# Patient Record
Sex: Female | Born: 2000 | Race: Black or African American | Hispanic: No | Marital: Single | Smoking: Former smoker
Health system: Southern US, Community
[De-identification: ages and names within clinical notes are randomized; demographics above are authoritative.]

## PROBLEM LIST (undated history)

## (undated) ENCOUNTER — Inpatient Hospital Stay (HOSPITAL_COMMUNITY): Payer: Self-pay

## (undated) ENCOUNTER — Ambulatory Visit: Admission: EM | Payer: Medicaid Other | Source: Home / Self Care

## (undated) VITALS — BP 108/79 | HR 105 | Temp 97.6°F | Resp 14 | Ht 59.45 in | Wt 86.0 lb

## (undated) DIAGNOSIS — IMO0001 Reserved for inherently not codable concepts without codable children: Secondary | ICD-10-CM

## (undated) DIAGNOSIS — K219 Gastro-esophageal reflux disease without esophagitis: Secondary | ICD-10-CM

## (undated) DIAGNOSIS — F32A Depression, unspecified: Secondary | ICD-10-CM

## (undated) DIAGNOSIS — N898 Other specified noninflammatory disorders of vagina: Secondary | ICD-10-CM

## (undated) DIAGNOSIS — N949 Unspecified condition associated with female genital organs and menstrual cycle: Secondary | ICD-10-CM

## (undated) DIAGNOSIS — F329 Major depressive disorder, single episode, unspecified: Secondary | ICD-10-CM

## (undated) DIAGNOSIS — R519 Headache, unspecified: Secondary | ICD-10-CM

## (undated) DIAGNOSIS — R51 Headache: Secondary | ICD-10-CM

## (undated) DIAGNOSIS — J302 Other seasonal allergic rhinitis: Secondary | ICD-10-CM

## (undated) DIAGNOSIS — J45909 Unspecified asthma, uncomplicated: Secondary | ICD-10-CM

## (undated) DIAGNOSIS — F419 Anxiety disorder, unspecified: Secondary | ICD-10-CM

## (undated) DIAGNOSIS — N765 Ulceration of vagina: Secondary | ICD-10-CM

## (undated) DIAGNOSIS — F988 Other specified behavioral and emotional disorders with onset usually occurring in childhood and adolescence: Secondary | ICD-10-CM

## (undated) DIAGNOSIS — T7840XA Allergy, unspecified, initial encounter: Secondary | ICD-10-CM

## (undated) DIAGNOSIS — N912 Amenorrhea, unspecified: Secondary | ICD-10-CM

## (undated) DIAGNOSIS — K120 Recurrent oral aphthae: Secondary | ICD-10-CM

## (undated) DIAGNOSIS — M25569 Pain in unspecified knee: Secondary | ICD-10-CM

## (undated) DIAGNOSIS — Z309 Encounter for contraceptive management, unspecified: Secondary | ICD-10-CM

## (undated) DIAGNOSIS — Z3009 Encounter for other general counseling and advice on contraception: Secondary | ICD-10-CM

## (undated) HISTORY — DX: Encounter for other general counseling and advice on contraception: Z30.09

## (undated) HISTORY — DX: Pain in unspecified knee: M25.569

## (undated) HISTORY — DX: Ulceration of vagina: N76.5

## (undated) HISTORY — PX: APPENDECTOMY: SHX54

## (undated) HISTORY — DX: Major depressive disorder, single episode, unspecified: F32.9

## (undated) HISTORY — DX: Depression, unspecified: F32.A

## (undated) HISTORY — DX: Unspecified condition associated with female genital organs and menstrual cycle: N94.9

## (undated) HISTORY — DX: Other specified behavioral and emotional disorders with onset usually occurring in childhood and adolescence: F98.8

## (undated) HISTORY — DX: Other specified noninflammatory disorders of vagina: N89.8

## (undated) HISTORY — DX: Anxiety disorder, unspecified: F41.9

## (undated) HISTORY — DX: Encounter for contraceptive management, unspecified: Z30.9

## (undated) HISTORY — DX: Amenorrhea, unspecified: N91.2

---
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
## 2021-01-01 ENCOUNTER — Ambulatory Visit
Admission: RE | Admit: 2021-01-01 | Discharge: 2021-01-01 | Disposition: A | Discharge status: Home / Self Care | Payer: Medicaid Other | Source: Ambulatory Visit | Attending: Family Medicine | Admitting: Family Medicine

## 2021-01-01 ENCOUNTER — Other Ambulatory Visit: Payer: Self-pay

## 2021-01-01 VITALS — BP 97/64 | HR 93 | Temp 98.2°F | Resp 18

## 2021-01-01 DIAGNOSIS — K121 Other forms of stomatitis: Secondary | ICD-10-CM

## 2021-01-01 MED ORDER — LIDOCAINE VISCOUS HCL 2 % MT SOLN: 15.0000 mL | OROMUCOSAL | 0 refills | Status: DC | PRN

## 2021-01-01 MED ORDER — PREDNISONE 20 MG PO TABS: 40.0000 mg | ORAL_TABLET | Freq: Every day | ORAL | 0 refills | Status: DC

## 2021-01-01 MED ORDER — NYSTATIN 100000 UNIT/ML MT SUSP: 500000.0000 [IU] | Freq: Four times a day (QID) | OROMUCOSAL | 0 refills | Status: DC

## 2021-01-01 NOTE — ED Provider Notes (Signed)
[facility]    CSN: [id number] Arrival date & time: 01/01/21  1348      History   Chief Complaint Chief Complaint  Patient presents with  . Mouth Lesions    HPI Taylor Jefferson is a 20 y.o. female.   HPI Patient presents today with painful oral ulcers. Patient reports since early adolescents experiencing ulceration in mouth. Current oral ulcer present for a few days. Ulcerations localized to lower lip, and tip of tongue. She is currently taking antibiotics for UTI and is uncertain if this caused her current flare of mouth ulcers.she has been rinsing with mouthwash without relief. Previously prescribed a mouth rinse, uncertain of the medication. No recent illness or recent fever. Past Medical History:  Diagnosis Date  . ADD (attention deficit disorder)   . Allergy   . Amenorrhea 11/02/2014  . Anxiety   . Asthma   . Chlamydia 09/27/2019   tx 12/7, POC________  . Contraceptive education 04/04/2014  . Contraceptive management 04/22/14  . Death of parent 14-Aug-2020  . Depression   . HA (headache)   . History of chlamydia 10/25/2019  . Irregular periods 03/17/2019  . Patellar pain    Dr. Dion Saucier  . 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 Precocious puberty 10/08/2011  . Reflux   . Reflux   . Seasonal allergies   . Suicidal thoughts 01/06/2018  . Vaginal burning 11/02/2014  . Vaginal discharge 11/02/2014  . Vaginal ulceration 04/04/2014   Will culture for HSV GC/CHL and a wound culture was obtained    Patient Active Problem List   Diagnosis Date Noted  . Non-seasonal allergic rhinitis due to pollen 08/29/2020  . Mild persistent asthma without complication 02/29/2020  . Flat feet, bilateral 10/09/2018  . 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 Migraine without aura and without status migrainosus, not intractable 12/24/2016  . Anxiety state 01/04/2015  . Tension headache 01/04/2015  .  Contraceptive management 2014-04-22  . Vaginal ulceration 04/04/2014    History reviewed. No pertinent surgical history.  OB History    Gravida  0   Para      Term      Preterm      AB      Living        SAB      IAB      Ectopic      Multiple      Live Births               Home Medications    Prior to Admission medications   Medication Sig Start Date End Date Taking? Authorizing Provider  lidocaine (XYLOCAINE) 2 % solution Use as directed 15 mLs in the mouth or throat every 4 (four) hours as needed for mouth pain (mix with nystatin gargle and spit). 01/01/21  Yes Bing Neighbors, FNP  nystatin (MYCOSTATIN) 100000 UNIT/ML suspension Take 5 mLs (500,000 Units total) by mouth 4 (four) times daily. 01/01/21  Yes Bing Neighbors, FNP  predniSONE (DELTASONE) 20 MG tablet Take 2 tablets (40 mg total) by mouth daily with breakfast. 01/01/21  Yes Bing Neighbors, FNP  albuterol (VENTOLIN HFA) 108 (90 Base) MCG/ACT inhaler Inhale 2 puffs into the lungs every 4 (four) hours as needed (Cough). USE WITH SPACER 08/29/20   Antonietta Barcelona, MD  cetirizine (ZYRTEC) 5 MG chewable tablet Chew 1 tablet (5 mg total) by mouth daily. 08/29/20   Antonietta Barcelona, MD  colchicine 0.6 MG tablet Take 0.6 mg by mouth daily. 04/05/19   [provider]  dapsone 25 MG tablet Take 10 mg by mouth daily.  06/18/17   [provider]  Dexmethylphenidate HCl 25 MG CP24 Take 1 capsule by mouth daily. 07/09/19   [provider]  etonogestrel (NEXPLANON) 68 MG IMPL implant 1 each by Subdermal route once.    [provider]  famotidine (PEPCID) 20 MG tablet Take 1 tablet (20 mg total) by mouth 2 (two) times daily. 07/12/20   Harris, Cammy Copa, PA-C  fluticasone (FLOVENT HFA) 44 MCG/ACT inhaler Inhale 2 puffs into the lungs in the morning and at bedtime. USE WITH A SPACER 08/29/20 09/28/20  Antonietta Barcelona, MD  GuanFACINE HCl 3 MG TB24 Take 1 tablet by mouth daily. 06/21/20   [provider]  HUMIRA PEN 40 MG/0.4ML PNKT Inject 40 mg into the skin every 14 (fourteen) days.  04/21/19   [provider]  montelukast (SINGULAIR) 10 MG tablet Take 1 tablet (10 mg total) by mouth daily. 10/16/20   Antonietta Barcelona, MD  OXcarbazepine (TRILEPTAL) 150 MG tablet Take 3 tablets (450 mg total) by mouth 2 (two) times daily. Patient taking differently: Take 300 mg by mouth 2 (two) times daily.  01/09/18   Starkes-Perry, Juel Burrow, FNP  propranolol (INDERAL) 10 MG tablet Take 1 tablet (10 mg total) by mouth 2 (two) times daily. 01/26/20   Keturah Shavers, MD  Spacer/Aero-Hold Chamber Mask (MASK VORTEX/CHILD/FROG) MISC 1 Units by Does not apply route as directed. 08/29/20   Antonietta Barcelona, MD  traZODone (DESYREL) 100 MG tablet Take 100 mg by mouth at bedtime as needed.  02/11/18   [provider]    Family History Family History  Problem Relation Age of Onset  . Depression Mother   . HIV Mother   . Other Mother        spinal stenosis  . Glaucoma Mother   . Migraines Mother   . Heart disease Father   . Hypertension Maternal Grandmother   . Arthritis Maternal Grandmother        rheumatoid  . Other Maternal Grandmother        acid reflux  . Migraines Maternal Grandmother   . Depression Maternal Grandmother   . Hyperlipidemia Maternal Grandfather   . ADD / ADHD Sister   . ADD / ADHD Brother     Social History Social History   Tobacco Use  . Smoking status: Former Games developer  . Smokeless tobacco: Never Used  Vaping Use  . Vaping Use: Every day  . Substances: Nicotine  Substance Use Topics  . Alcohol use: No  . Drug use: Yes    Types: Marijuana     Allergies   Other   Review of Systems Review of Systems Pertinent negatives listed in HPI   Physical Exam Triage Vital Signs ED Triage Vitals  Enc Vitals Group     BP 01/01/21 1424 97/64     Pulse Rate 01/01/21 1424 93     Resp 01/01/21 1424 18     Temp 01/01/21 1424 98.2 F (36.8 C)     Temp Source  01/01/21 1424 Oral     SpO2 01/01/21 1424 97 %     Weight --      Height --      Head Circumference --      Peak Flow --      Pain Score 01/01/21 1423 9     Pain Loc --  Pain Edu? --      Excl. in GC? --    No data found.  Updated Vital Signs BP 97/64 (BP Location: Right Arm)   Pulse 93   Temp 98.2 F (36.8 C) (Oral)   Resp 18   SpO2 97%   Visual Acuity Right Eye Distance:   Left Eye Distance:   Bilateral Distance:    Right Eye Near:   Left Eye Near:    Bilateral Near:     Physical Exam Constitutional:      Appearance: Normal appearance.  HENT:     Head: Normocephalic.     Mouth/Throat:     Mouth: Mucous membranes are moist. Oral lesions present.     Tongue: Lesions present.     Pharynx: Oropharynx is clear. Uvula midline.     Tonsils: No tonsillar exudate.     Comments: Ulceration lower lip and tip on tongue  Cardiovascular:     Rate and Rhythm: Normal rate and regular rhythm.  Musculoskeletal:     Cervical back: Normal range of motion and neck supple.  Skin:    General: Skin is warm.  Neurological:     Mental Status: She is alert.    UC Treatments / Results  Labs (all labs ordered are listed, but only abnormal results are displayed) Labs Reviewed - No data to display  EKG   Radiology No results found.  Procedures Procedures (including critical care time)  Medications Ordered in UC Medications - No data to display  Initial Impression / Assessment and Plan / UC Course  I have reviewed the triage vital signs and the nursing notes.  Pertinent labs & imaging results that were available during my care of the patient were reviewed by me and considered in my medical decision making (see chart for details).    Mouth ulceration, trial Nystatin and lidocaine mix rinse and spit. Prednisone 40 mg daily x 5 days. Return precautions discussed. Final Clinical Impressions(s) / UC Diagnoses   Final diagnoses:  Mouth ulceration   Discharge  Instructions   None    ED Prescriptions    Medication Sig Dispense Auth. Provider   nystatin (MYCOSTATIN) 100000 UNIT/ML suspension Take 5 mLs (500,000 Units total) by mouth 4 (four) times daily. 60 mL Bing Neighbors, FNP   lidocaine (XYLOCAINE) 2 % solution Use as directed 15 mLs in the mouth or throat every 4 (four) hours as needed for mouth pain (mix with nystatin gargle and spit). 100 mL Bing Neighbors, FNP   predniSONE (DELTASONE) 20 MG tablet Take 2 tablets (40 mg total) by mouth daily with breakfast. 10 tablet Bing Neighbors, FNP     PDMP not reviewed this encounter.   Bing Neighbors, FNP 01/04/21 [id number]

## 2021-01-01 NOTE — ED Triage Notes (Signed)
Pt states she is having a flare up of mouth ulcers, has a hx of the same .  Also reports lower abd cramping x 2 months, dx with uti and is currently on abx.

## 2021-01-02 ENCOUNTER — Ambulatory Visit (INDEPENDENT_AMBULATORY_CARE_PROVIDER_SITE_OTHER): Payer: Medicaid Other | Admitting: Adult Health

## 2021-01-02 ENCOUNTER — Encounter: Payer: Self-pay | Admitting: Adult Health

## 2021-01-02 ENCOUNTER — Other Ambulatory Visit (HOSPITAL_COMMUNITY)
Admission: RE | Admit: 2021-01-02 | Discharge: 2021-01-02 | Disposition: A | Discharge status: Home / Self Care | Payer: Medicaid Other | Source: Ambulatory Visit | Attending: Adult Health | Admitting: Adult Health

## 2021-01-02 VITALS — BP 110/77 | HR 87 | Ht 62.0 in | Wt 120.0 lb

## 2021-01-02 DIAGNOSIS — Z3202 Encounter for pregnancy test, result negative: Secondary | ICD-10-CM

## 2021-01-02 DIAGNOSIS — N921 Excessive and frequent menstruation with irregular cycle: Secondary | ICD-10-CM

## 2021-01-02 DIAGNOSIS — Z975 Presence of (intrauterine) contraceptive device: Secondary | ICD-10-CM

## 2021-01-02 LAB — POCT URINE PREGNANCY: Preg Test, Ur: NEGATIVE

## 2021-01-02 MED ORDER — MEGESTROL ACETATE 40 MG PO TABS: ORAL_TABLET | ORAL | 1 refills | Status: DC

## 2021-01-02 NOTE — Progress Notes (Signed)
  Subjective:     Patient ID: Taylor Jefferson, female   DOB: Jun 17, 2001, 20 y.o.   MRN: [id number]  HPI Taylor Jefferson is a 20 year old black female,single, G0P0 in complaining of irregular bleeding with nexplanon, and pain with sex. She was seen in ER in [facility] 12/24/20 and told had cyst on right ovary. PCP is Dr Taylor Jefferson.  Review of Systems Has had headache for 2 days, ?allergies try zyrtec  +irregular  Bleeding, has nexplanon +Pain with sex   Reviewed past medical,surgical, social and family history. Reviewed medications and allergies.  Objective:   Physical Exam BP 110/77 (BP Location: Left Arm, Patient Position: Sitting, Cuff Size: Normal)   Pulse 87   Ht 5\' 2"  (1.575 m)   Wt 120 lb (54.4 kg)   BMI 21.95 kg/m UPT is negative.Skin warm and dry.  Lungs: clear to ausculation bilaterally. Cardiovascular: regular rate and rhythm.   Pelvic: external genitalia is normal in appearance no lesions, vagina: tan discharge without odor,urethra has no lesions or masses noted, cervix:smooth,no CMT, uterus: normal size, shape and contour, mildly tender, no masses felt, adnexa: no masses or tenderness noted. Bladder is non tender and no masses felt. CV swab obtained. Fall risk is low  Upstream - 01/02/21 1228      Pregnancy Intention Screening   Does the patient want to become pregnant in the next year? No    Does the patient's partner want to become pregnant in the next year? No    Would the patient like to discuss contraceptive options today? No      Contraception Wrap Up   Current Method Hormonal Implant    End Method Hormonal Implant    Contraception Counseling Provided No         Co exam with 1229 NP student. Assessment:     1. Pregnancy examination or test, negative result   2. Irregular intermenstrual bleeding Will try megace Meds ordered this encounter  Medications  . megestrol (MEGACE) 40 MG tablet    Sig: Take 3 x 5 days then 2 x 5 days then 1 daily till bleeding stops     Dispense:  45 tablet    Refill:  1    Order Specific Question:   Supervising Provider    Answer:   [place] Charon H [[id number]]   CV swab sent for GC/CHl,trich and BV and yeast   3. Nexplanon in place     Plan:     Return in 4 weeks in follow and she is interested in IUD, gave handout on mirena

## 2021-01-04 LAB — CERVICOVAGINAL ANCILLARY ONLY
Bacterial Vaginitis (gardnerella): POSITIVE — AB
Candida Glabrata: NEGATIVE
Candida Vaginitis: NEGATIVE
Chlamydia: NEGATIVE
Comment: NEGATIVE
Comment: NEGATIVE
Comment: NEGATIVE
Comment: NEGATIVE
Comment: NEGATIVE
Comment: NORMAL
Neisseria Gonorrhea: NEGATIVE
Trichomonas: NEGATIVE

## 2021-01-05 ENCOUNTER — Telehealth: Payer: Self-pay | Admitting: Adult Health

## 2021-01-05 MED ORDER — METRONIDAZOLE 500 MG PO TABS: 500.0000 mg | ORAL_TABLET | Freq: Two times a day (BID) | ORAL | 0 refills | Status: DC

## 2021-01-05 NOTE — Telephone Encounter (Signed)
NO VM, rx flagyl for +BV on vaginal swab

## 2021-01-20 ENCOUNTER — Emergency Department (HOSPITAL_COMMUNITY)
Admission: EM | Admit: 2021-01-20 | Discharge: 2021-01-20 | Disposition: A | Discharge status: Home / Self Care | Payer: Medicaid Other | Attending: Emergency Medicine | Admitting: Emergency Medicine

## 2021-01-20 ENCOUNTER — Other Ambulatory Visit: Payer: Self-pay

## 2021-01-20 DIAGNOSIS — Z87891 Personal history of nicotine dependence: Secondary | ICD-10-CM | POA: Insufficient documentation

## 2021-01-20 DIAGNOSIS — Z7952 Long term (current) use of systemic steroids: Secondary | ICD-10-CM | POA: Insufficient documentation

## 2021-01-20 DIAGNOSIS — N760 Acute vaginitis: Secondary | ICD-10-CM | POA: Insufficient documentation

## 2021-01-20 DIAGNOSIS — J45909 Unspecified asthma, uncomplicated: Secondary | ICD-10-CM | POA: Diagnosis not present

## 2021-01-20 DIAGNOSIS — N898 Other specified noninflammatory disorders of vagina: Secondary | ICD-10-CM | POA: Diagnosis present

## 2021-01-20 LAB — WET PREP, GENITAL
Clue Cells Wet Prep HPF POC: NONE SEEN
Sperm: NONE SEEN
Trich, Wet Prep: NONE SEEN
Yeast Wet Prep HPF POC: NONE SEEN

## 2021-01-20 LAB — URINALYSIS, ROUTINE W REFLEX MICROSCOPIC
Bilirubin Urine: NEGATIVE
Glucose, UA: NEGATIVE mg/dL
Hgb urine dipstick: NEGATIVE
Ketones, ur: NEGATIVE mg/dL
Leukocytes,Ua: NEGATIVE
Nitrite: NEGATIVE
Protein, ur: NEGATIVE mg/dL
Specific Gravity, Urine: 1.014 (ref 1.005–1.030)
pH: 6 (ref 5.0–8.0)

## 2021-01-20 LAB — POC URINE PREG, ED: Preg Test, Ur: NEGATIVE

## 2021-01-20 MED ORDER — DOXYCYCLINE HYCLATE 100 MG PO TABS
100.0000 mg | ORAL_TABLET | Freq: Once | ORAL | Status: AC
  Administered 2021-01-20: 100 mg via ORAL
  Filled 2021-01-20: qty 1

## 2021-01-20 MED ORDER — CEFTRIAXONE SODIUM 500 MG IJ SOLR
500.0000 mg | Freq: Once | INTRAMUSCULAR | Status: AC
  Administered 2021-01-20: 500 mg via INTRAMUSCULAR
  Filled 2021-01-20: qty 500

## 2021-01-20 MED ORDER — DOXYCYCLINE HYCLATE 100 MG PO CAPS: 100.0000 mg | ORAL_CAPSULE | Freq: Two times a day (BID) | ORAL | 0 refills | Status: DC

## 2021-01-20 MED ORDER — FLUCONAZOLE 200 MG PO TABS: 200.0000 mg | ORAL_TABLET | Freq: Every day | ORAL | 0 refills | Status: AC

## 2021-01-20 MED ORDER — STERILE WATER FOR INJECTION IJ SOLN
INTRAMUSCULAR | Status: AC
  Filled 2021-01-20: qty 10

## 2021-01-20 MED ORDER — FLUCONAZOLE 100 MG PO TABS
100.0000 mg | ORAL_TABLET | Freq: Once | ORAL | Status: AC
  Administered 2021-01-20: 100 mg via ORAL
  Filled 2021-01-20: qty 1

## 2021-01-20 NOTE — ED Triage Notes (Signed)
Pt c/o vaginal burning for the past 10 minutes. Pt also states she has been having vaginal discharge r/t a severe yeast infection.

## 2021-01-20 NOTE — ED Provider Notes (Signed)
[facility] EMERGENCY DEPARTMENT Provider Note   CSN: [id number] Arrival date & time: 01/20/21  0350     History Chief Complaint  Patient presents with  . Vaginal Discharge    Taylor Jefferson is a 20 y.o. female.   Vaginal Discharge Quality:  Milky, thick and white Severity:  Mild Onset quality:  Gradual Duration:  3 days Timing:  Constant Chronicity:  Recurrent Relieved by:  Nothing Worsened by:  Nothing Ineffective treatments: flagy for recent dx of BV. Associated symptoms: genital lesions (chronic)   Associated symptoms: no abdominal pain, no dyspareunia, no dysuria, no fever, no nausea and no rash        Past Medical History:  Diagnosis Date  . ADD (attention deficit disorder)   . Allergy   . Amenorrhea 11/02/2014  . Anxiety   . Asthma   . Chlamydia 09/27/2019   tx 12/7, POC________  . Contraceptive education 04/04/2014  . Contraceptive management 05-10-2014  . Death of parent 09-01-2020  . Depression   . HA (headache)   . History of chlamydia 10/25/2019  . Irregular periods 03/17/2019  . Patellar pain    Dr. Dion Saucier  . 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 Precocious puberty 10/08/2011  . Reflux   . Reflux   . Seasonal allergies   . Suicidal thoughts 01/06/2018  . Vaginal burning 11/02/2014  . Vaginal discharge 11/02/2014  . Vaginal ulceration 04/04/2014   Will culture for HSV GC/CHL and a wound culture was obtained    Patient Active Problem List   Diagnosis Date Noted  . Pregnancy examination or test, negative result 01/02/2021  . Nexplanon in place 01/02/2021  . Irregular intermenstrual bleeding 01/02/2021  . Non-seasonal allergic rhinitis due to pollen 08/29/2020  . Mild persistent asthma without complication 02/29/2020  . Flat feet, bilateral 10/09/2018  . 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 Migraine without aura and without status migrainosus, not intractable 12/24/2016  .  Anxiety state 01/04/2015  . Tension headache 01/04/2015  . Contraceptive management 05-10-2014  . Vaginal ulceration 04/04/2014    No past surgical history on file.   OB History    Gravida  0   Para      Term      Preterm      AB      Living        SAB      IAB      Ectopic      Multiple      Live Births              Family History  Problem Relation Age of Onset  . Depression Mother   . HIV Mother   . Other Mother        spinal stenosis  . Glaucoma Mother   . Migraines Mother   . Heart disease Father   . Hypertension Maternal Grandmother   . Arthritis Maternal Grandmother        rheumatoid  . Other Maternal Grandmother        acid reflux  . Migraines Maternal Grandmother   . Depression Maternal Grandmother   . Hyperlipidemia Maternal Grandfather   . ADD / ADHD Sister   . ADD / ADHD Brother     Social History   Tobacco Use  . Smoking status: Former Games developer  . Smokeless tobacco: Never Used  Vaping Use  . Vaping Use: Every day  . Substances: Nicotine  Substance  Use Topics  . Alcohol use: No  . Drug use: Not Currently    Types: Marijuana    Home Medications Prior to Admission medications   Medication Sig Start Date End Date Taking? Authorizing Provider  doxycycline (VIBRAMYCIN) 100 MG capsule Take 1 capsule (100 mg total) by mouth 2 (two) times daily. One po bid x 7 days 01/20/21  Yes Seven Dollens, Barbara Cower, MD  fluconazole (DIFLUCAN) 200 MG tablet Take 1 tablet (200 mg total) by mouth daily for 7 days. 01/20/21 01/27/21 Yes Zaryiah Barz, Barbara Cower, MD  albuterol (VENTOLIN HFA) 108 (90 Base) MCG/ACT inhaler Inhale 2 puffs into the lungs every 4 (four) hours as needed (Cough). USE WITH SPACER 08/29/20   Antonietta Barcelona, MD  colchicine 0.6 MG tablet Take 0.6 mg by mouth daily. 04/05/19   [provider]  dapsone 25 MG tablet Take 10 mg by mouth daily.  06/18/17   [provider]  Dexmethylphenidate HCl 25 MG CP24 Take 1 capsule by mouth daily. 07/09/19    [provider]  etonogestrel (NEXPLANON) 68 MG IMPL implant 1 each by Subdermal route once.    [provider]  fluticasone (FLOVENT HFA) 44 MCG/ACT inhaler Inhale 2 puffs into the lungs in the morning and at bedtime. USE WITH A SPACER 08/29/20 09/28/20  Antonietta Barcelona, MD  HUMIRA PEN 40 MG/0.4ML PNKT Inject 40 mg into the skin every 14 (fourteen) days.  04/21/19   [provider]  ibuprofen (ADVIL) 600 MG tablet Take 600 mg by mouth every 8 (eight) hours as needed. 12/26/20   [provider]  megestrol (MEGACE) 40 MG tablet Take 3 x 5 days then 2 x 5 days then 1 daily till bleeding stops 01/02/21   Cyril Mourning A, NP  nystatin (MYCOSTATIN) 100000 UNIT/ML suspension Take 5 mLs (500,000 Units total) by mouth 4 (four) times daily. 01/01/21   Bing Neighbors, FNP  predniSONE (DELTASONE) 20 MG tablet Take 2 tablets (40 mg total) by mouth daily with breakfast. 01/01/21   Bing Neighbors, FNP  sodium chloride 0.9 % SOLN 1,000 mL with lidocaine (PF) 1 % SOLN 30 mL, EPINEPHrine 1 MG/ML SOLN 1 mg by Infiltration route.    [provider]  Spacer/Aero-Hold Chamber Mask (MASK VORTEX/CHILD/FROG) MISC 1 Units by Does not apply route as directed. 08/29/20   Antonietta Barcelona, MD  traZODone (DESYREL) 100 MG tablet Take 100 mg by mouth at bedtime as needed.  02/11/18   [provider]    Allergies    Other  Review of Systems   Review of Systems  Constitutional: Negative for fever.  Gastrointestinal: Negative for abdominal pain and nausea.  Genitourinary: Positive for vaginal discharge. Negative for dyspareunia and dysuria.  All other systems reviewed and are negative.   Physical Exam Updated Vital Signs BP (!) 129/96 (BP Location: Left Arm)   Pulse 82   Temp 98.9 F (37.2 C) (Oral)   Resp 16   Ht 5\' 2"  (1.575 m)   Wt 54.4 kg   SpO2 100%   BMI 21.95 kg/m   Physical Exam Vitals and nursing note reviewed. Exam conducted with a chaperone present.   Constitutional:      Appearance: She is well-developed.  HENT:     Head: Normocephalic and atraumatic.     Mouth/Throat:     Mouth: Mucous membranes are moist.     Pharynx: Oropharynx is clear.  Eyes:     Pupils: Pupils are equal, round, and reactive to light.  Cardiovascular:  Rate and Rhythm: Normal rate and regular rhythm.  Pulmonary:     Effort: No respiratory distress.     Breath sounds: No stridor.  Abdominal:     General: Abdomen is flat. There is no distension.  Genitourinary:    Pubic Area: No rash.      Vagina: No signs of injury. Vaginal discharge present. No tenderness or bleeding.     Cervix: Erythema present.     Adnexa: Right adnexa normal and left adnexa normal.     Comments: Nurse reece present as chaperone Musculoskeletal:        General: No swelling or tenderness. Normal range of motion.     Cervical back: Normal range of motion.  Skin:    General: Skin is warm and dry.  Neurological:     General: No focal deficit present.     Mental Status: She is alert.     ED Results / Procedures / Treatments   Labs (all labs ordered are listed, but only abnormal results are displayed) Labs Reviewed  WET PREP, GENITAL - Abnormal; Notable for the following components:      Result Value   WBC, Wet Prep HPF POC FEW (*)    All other components within normal limits  URINALYSIS, ROUTINE W REFLEX MICROSCOPIC - Abnormal; Notable for the following components:   Color, Urine STRAW (*)    All other components within normal limits  POC URINE PREG, ED  GC/CHLAMYDIA PROBE AMP ([place]) NOT AT [facility]    EKG None  Radiology No results found.  Procedures Procedures   Medications Ordered in ED Medications  cefTRIAXone (ROCEPHIN) injection 500 mg (has no administration in time range)  doxycycline (VIBRA-TABS) tablet 100 mg (has no administration in time range)  fluconazole (DIFLUCAN) tablet 100 mg (has no administration in time range)    ED Course  I have  reviewed the triage vital signs and the nursing notes.  Pertinent labs & imaging results that were available during my care of the patient were reviewed by me and considered in my medical decision making (see chart for details).    MDM Rules/Calculators/A&P                          Treat for STD, ppx treatment for yeast infection as she often gets them. No uti. No pregnancy.   Final Clinical Impression(s) / ED Diagnoses Final diagnoses:  Acute vaginitis    Rx / DC Orders ED Discharge Orders         Ordered    doxycycline (VIBRAMYCIN) 100 MG capsule  2 times daily        01/20/21 0455    fluconazole (DIFLUCAN) 200 MG tablet  Daily        01/20/21 0455           Jerrilynn Mikowski, Barbara Cower, MD 01/20/21 [phone]

## 2021-01-21 LAB — GC/CHLAMYDIA PROBE AMP (~~LOC~~) NOT AT ARMC
Chlamydia: NEGATIVE
Comment: NEGATIVE
Comment: NORMAL
Neisseria Gonorrhea: NEGATIVE

## 2021-01-25 ENCOUNTER — Other Ambulatory Visit: Payer: Self-pay | Admitting: Adult Health

## 2021-01-30 ENCOUNTER — Ambulatory Visit: Payer: Medicaid Other | Admitting: Adult Health

## 2021-02-06 DIAGNOSIS — R5381 Other malaise: Secondary | ICD-10-CM | POA: Diagnosis not present

## 2021-02-06 DIAGNOSIS — R918 Other nonspecific abnormal finding of lung field: Secondary | ICD-10-CM | POA: Diagnosis not present

## 2021-02-06 DIAGNOSIS — R69 Illness, unspecified: Secondary | ICD-10-CM | POA: Diagnosis not present

## 2021-02-06 DIAGNOSIS — J189 Pneumonia, unspecified organism: Secondary | ICD-10-CM | POA: Diagnosis not present

## 2021-02-07 DIAGNOSIS — J189 Pneumonia, unspecified organism: Secondary | ICD-10-CM | POA: Diagnosis not present

## 2021-02-08 ENCOUNTER — Other Ambulatory Visit: Payer: Self-pay | Admitting: Adult Health

## 2021-02-13 ENCOUNTER — Other Ambulatory Visit: Payer: Self-pay

## 2021-02-13 ENCOUNTER — Encounter: Payer: Self-pay | Admitting: Pediatrics

## 2021-02-13 ENCOUNTER — Telehealth: Payer: Self-pay | Admitting: Pediatrics

## 2021-02-13 ENCOUNTER — Ambulatory Visit (INDEPENDENT_AMBULATORY_CARE_PROVIDER_SITE_OTHER): Payer: Medicaid Other | Admitting: Pediatrics

## 2021-02-13 VITALS — BP 113/76 | HR 91 | Ht 62.36 in | Wt 122.4 lb

## 2021-02-13 DIAGNOSIS — J4530 Mild persistent asthma, uncomplicated: Secondary | ICD-10-CM

## 2021-02-13 MED ORDER — FLOVENT HFA 44 MCG/ACT IN AERO: 2.0000 | INHALATION_SPRAY | Freq: Two times a day (BID) | RESPIRATORY_TRACT | 5 refills | Status: DC

## 2021-02-13 MED ORDER — MONTELUKAST SODIUM 10 MG PO TABS: 1.0000 | ORAL_TABLET | Freq: Every day | ORAL | 11 refills | Status: DC

## 2021-02-13 MED ORDER — ALBUTEROL SULFATE HFA 108 (90 BASE) MCG/ACT IN AERS: 2.0000 | INHALATION_SPRAY | RESPIRATORY_TRACT | 0 refills | Status: DC | PRN

## 2021-02-13 MED ORDER — ALBUTEROL SULFATE (2.5 MG/3ML) 0.083% IN NEBU: 2.5000 mg | INHALATION_SOLUTION | RESPIRATORY_TRACT | 0 refills | Status: DC | PRN

## 2021-02-13 MED ORDER — NEBULIZER SYSTEM ALL-IN-ONE MISC: 1.0000 "application " | Freq: Once | 0 refills | Status: AC

## 2021-02-13 NOTE — Telephone Encounter (Signed)
[facility] is waiting on the solution for Taylor Jefferson's nebulizer. Can this be sent over?

## 2021-02-13 NOTE — Progress Notes (Signed)
Patient is accompanied by self. Patient is the primary historian.  Subjective:    Taylor Jefferson  is a 20 y.o. who presents for ED follow up.   Patient was seen at [facility] ED on 02/07/21 for fever and worsening cough. Patient was diagnosed with Pneumonia and sent home on Augmentin. CXR revealed mild diffuse alveolar opacity in the mid left lung compared to the right suspicious for pneumonitis.   Patient notes that she continues to have shortness of breath possibly secondary to ashtma.   Past Medical History:  Diagnosis Date   ADD (attention deficit disorder)    Allergy    Amenorrhea 11/02/2014   Anxiety    Asthma    Chlamydia 09/27/2019   tx 12/7, POC________   Contraceptive education 04/04/2014   Contraceptive management 04/28/14   Death of parent 08-20-2020   Depression    HA (headache)    History of chlamydia 10/25/2019   Irregular periods 03/17/2019   Patellar pain    Dr. 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 No pertinent surgical history.   Family History  Problem Relation Age of Onset   Depression Mother    HIV Mother    Other Mother        spinal stenosis   Glaucoma Mother    Migraines Mother    Heart disease Father    Hypertension Maternal Grandmother    Arthritis Maternal Grandmother        rheumatoid   Other Maternal Grandmother        acid reflux   Migraines Maternal Grandmother    Depression Maternal Grandmother    Hyperlipidemia Maternal Grandfather    ADD / ADHD Sister    ADD / ADHD Brother     Current Meds  Medication Sig   albuterol (PROVENTIL) (2.5 MG/3ML) 0.083% nebulizer solution Take 3 mLs (2.5 mg total) by nebulization every 4 (four)  hours as needed for wheezing or shortness of breath.   etonogestrel (NEXPLANON) 68 MG IMPL implant 1 each by Subdermal route once.   fluticasone (FLOVENT HFA) 44 MCG/ACT inhaler Inhale 2 puffs into the lungs in the morning and at bedtime. (Patient not taking: No sig reported)   [EXPIRED] Nebulizer System All-In-One MISC 1 application by Does not apply route once for 1 dose.   [DISCONTINUED] albuterol (VENTOLIN HFA) 108 (90 Base) MCG/ACT inhaler Inhale 2 puffs into the lungs every 4 (four) hours as needed (Cough). USE WITH SPACER   [DISCONTINUED] HUMIRA PEN 40 MG/0.4ML PNKT Inject 40 mg into the skin every 14 (fourteen) days.    [DISCONTINUED] ibuprofen (ADVIL) 600 MG tablet Take 600 mg by mouth every 8 (eight) hours as needed.   [DISCONTINUED] montelukast (SINGULAIR) 10 MG tablet Take 1 tablet by mouth daily.   [DISCONTINUED] Spacer/Aero-Hold Chamber Mask (MASK VORTEX/CHILD/FROG) MISC 1 Units by Does not apply route as directed.       Allergies  Allergen Reactions   Other Other (See Comments)    Seasonal - runny nose, itchy eyes    Review of Systems  Constitutional: Negative.  Negative for fever and malaise/fatigue.  HENT: Negative.  Negative for congestion, ear pain and  sore throat.   Eyes: Negative.  Negative for discharge.  Respiratory:  Positive for cough and shortness of breath. Negative for wheezing.   Cardiovascular: Negative.  Negative for chest pain.  Gastrointestinal: Negative.  Negative for diarrhea and vomiting.  Genitourinary: Negative.   Musculoskeletal: Negative.  Negative for joint pain.  Skin: Negative.  Negative for rash.  Neurological: Negative.     Objective:   Blood pressure 113/76, pulse 91, height 5' 2.36" (1.584 m), weight 122 lb 6.4 oz (55.5 kg), SpO2 100 %.  Physical Exam Constitutional:      General: She is not in acute distress.    Appearance: Normal appearance.  HENT:     Head: Normocephalic and atraumatic.     Right Ear: Tympanic membrane, ear  canal and external ear normal.     Left Ear: Tympanic membrane, ear canal and external ear normal.     Nose: Nose normal.     Mouth/Throat:     Mouth: Mucous membranes are moist.     Pharynx: Oropharynx is clear.  Eyes:     Conjunctiva/sclera: Conjunctivae normal.  Cardiovascular:     Rate and Rhythm: Normal rate and regular rhythm.     Heart sounds: Normal heart sounds.  Pulmonary:     Effort: Pulmonary effort is normal. No respiratory distress.     Breath sounds: Normal breath sounds. No wheezing.  Chest:     Chest wall: No tenderness.  Musculoskeletal:        General: Normal range of motion.     Cervical back: Normal range of motion and neck supple.  Lymphadenopathy:     Cervical: No cervical adenopathy.  Skin:    General: Skin is warm.  Neurological:     General: No focal deficit present.     Mental Status: She is alert.  Psychiatric:        Mood and Affect: Mood and affect normal.     IN-HOUSE Laboratory Results:    No results found for any visits on 02/13/21.   Assessment:    Mild persistent asthma without complication - Plan: Nebulizer System All-In-One MISC, albuterol (VENTOLIN HFA) 108 (90 Base) MCG/ACT inhaler, fluticasone (FLOVENT HFA) 44 MCG/ACT inhaler, albuterol (PROVENTIL) (2.5 MG/3ML) 0.083% nebulizer solution, DISCONTINUED: montelukast (SINGULAIR) 10 MG tablet  Plan:   Will start on Flovent daily, albuterol PRN and recheck in 4 weeks. Possibly repeat CXR at that time.   Meds ordered this encounter  Medications   Nebulizer System All-In-One MISC    Sig: 1 application by Does not apply route once for 1 dose.    Dispense:  1 each    Refill:  0   DISCONTD: montelukast (SINGULAIR) 10 MG tablet    Sig: Take 1 tablet (10 mg total) by mouth daily.    Dispense:  30 tablet    Refill:  11   albuterol (VENTOLIN HFA) 108 (90 Base) MCG/ACT inhaler    Sig: Inhale 2 puffs into the lungs every 4 (four) hours as needed (Cough). USE WITH SPACER    Dispense:  36 g     Refill:  0   fluticasone (FLOVENT HFA) 44 MCG/ACT inhaler    Sig: Inhale 2 puffs into the lungs in the morning and at bedtime.    Dispense:  1 each    Refill:  5   albuterol (PROVENTIL) (2.5 MG/3ML) 0.083% nebulizer solution    Sig: Take 3 mLs (2.5 mg total) by nebulization every 4 (four) hours as needed for wheezing or  shortness of breath.    Dispense:  75 mL    Refill:  0    No orders of the defined types were placed in this encounter.

## 2021-02-13 NOTE — Telephone Encounter (Signed)
Patient requested solution in addition to other medications sent to a different pharmacy. Patient only requested nebulizer to be sent to [facility]. Thank you.

## 2021-02-13 NOTE — Telephone Encounter (Signed)
Informed Tracey at [facility]

## 2021-03-14 ENCOUNTER — Other Ambulatory Visit: Payer: Self-pay

## 2021-03-14 ENCOUNTER — Encounter: Payer: Self-pay | Admitting: Pediatrics

## 2021-03-14 ENCOUNTER — Ambulatory Visit (INDEPENDENT_AMBULATORY_CARE_PROVIDER_SITE_OTHER): Payer: Medicaid Other | Admitting: Pediatrics

## 2021-03-14 VITALS — BP 109/75 | HR 91 | Ht 63.07 in | Wt 130.6 lb

## 2021-03-14 DIAGNOSIS — J4541 Moderate persistent asthma with (acute) exacerbation: Secondary | ICD-10-CM | POA: Diagnosis not present

## 2021-03-14 DIAGNOSIS — J189 Pneumonia, unspecified organism: Secondary | ICD-10-CM | POA: Diagnosis not present

## 2021-03-14 MED ORDER — PREDNISONE 20 MG PO TABS: 20.0000 mg | ORAL_TABLET | Freq: Two times a day (BID) | ORAL | 0 refills | Status: AC

## 2021-03-14 MED ORDER — AZITHROMYCIN 250 MG PO TABS: 500.0000 mg | ORAL_TABLET | Freq: Every day | ORAL | 0 refills | Status: AC

## 2021-03-14 NOTE — Progress Notes (Signed)
Patient is the primary historian during today's visit.  Subjective:    Taylor Jefferson  is a 20 y.o. who presents for recheck asthma. Patient continues to have cough, chest tightness and intermittent episodes of shortness of breath. Patient continues on her Flovent BID and albuterol PRN. No fever.   Past Medical History:  Diagnosis Date  . ADD (attention deficit disorder)   . Allergy   . Amenorrhea 11/02/2014  . Anxiety   . Asthma   . Chlamydia 09/27/2019   tx 12/7, POC________  . Contraceptive education 04/04/2014  . Contraceptive management 04/15/14  . Death of parent August 07, 2020  . Depression   . HA (headache)   . History of chlamydia 10/25/2019  . Irregular periods 03/17/2019  . Patellar pain    Dr. Dion Saucier  . 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 Precocious puberty 10/08/2011  . Reflux   . Reflux   . Seasonal allergies   . Suicidal thoughts 01/06/2018  . Vaginal burning 11/02/2014  . Vaginal discharge 11/02/2014  . Vaginal ulceration 04/04/2014   Will culture for HSV GC/CHL and a wound culture was obtained     History reviewed. No pertinent surgical history.   Family History  Problem Relation Age of Onset  . Depression Mother   . HIV Mother   . Other Mother        spinal stenosis  . Glaucoma Mother   . Migraines Mother   . Heart disease Father   . Hypertension Maternal Grandmother   . Arthritis Maternal Grandmother        rheumatoid  . Other Maternal Grandmother        acid reflux  . Migraines Maternal Grandmother   . Depression Maternal Grandmother   . Hyperlipidemia Maternal Grandfather   . ADD / ADHD Sister   . ADD / ADHD Brother     Current Meds  Medication Sig  . albuterol (PROVENTIL) (2.5 MG/3ML) 0.083% nebulizer solution Take 3 mLs (2.5 mg total) by nebulization every 4 (four) hours as needed for wheezing or shortness of breath.  Marland Kitchen albuterol (VENTOLIN HFA) 108 (90 Base) MCG/ACT inhaler Inhale 2 puffs into the lungs every 4 (four) hours as  needed (Cough). USE WITH SPACER  . azithromycin (ZITHROMAX) 250 MG tablet Take 2 tablets (500 mg total) by mouth daily for 3 days.  Marland Kitchen etonogestrel (NEXPLANON) 68 MG IMPL implant 1 each by Subdermal route once.  . fluticasone (FLOVENT HFA) 44 MCG/ACT inhaler Inhale 2 puffs into the lungs in the morning and at bedtime.  Marland Kitchen HUMIRA PEN 40 MG/0.4ML PNKT Inject 40 mg into the skin every 14 (fourteen) days.   Marland Kitchen ibuprofen (ADVIL) 600 MG tablet Take 600 mg by mouth every 8 (eight) hours as needed.  . montelukast (SINGULAIR) 10 MG tablet Take 1 tablet (10 mg total) by mouth daily.  . predniSONE (DELTASONE) 20 MG tablet Take 1 tablet (20 mg total) by mouth 2 (two) times daily with a meal for 3 days.  Marland Kitchen Spacer/Aero-Hold Chamber Mask (MASK VORTEX/CHILD/FROG) MISC 1 Units by Does not apply route as directed.       Allergies  Allergen Reactions  . Other Other (See Comments)    Seasonal - runny nose, itchy eyes    Review of Systems  Constitutional: Negative.  Negative for fever and malaise/fatigue.  HENT: Negative.  Negative for congestion, ear pain and sore throat.   Eyes: Negative.  Negative for discharge.  Respiratory: Positive for cough and shortness of breath. Negative  for wheezing.   Cardiovascular: Positive for chest pain.  Gastrointestinal: Negative.  Negative for diarrhea and vomiting.  Genitourinary: Negative.   Musculoskeletal: Negative.  Negative for joint pain.  Skin: Negative.  Negative for rash.  Neurological: Negative.      Objective:   Blood pressure 109/75, pulse 91, height 5' 3.07" (1.602 m), weight 130 lb 9.6 oz (59.2 kg).  Physical Exam Constitutional:      General: She is not in acute distress.    Appearance: Normal appearance.  HENT:     Head: Normocephalic and atraumatic.     Right Ear: Tympanic membrane, ear canal and external ear normal.     Left Ear: Tympanic membrane, ear canal and external ear normal.     Nose: Nose normal.     Mouth/Throat:     Mouth: Mucous  membranes are moist.     Pharynx: Oropharynx is clear.  Eyes:     Conjunctiva/sclera: Conjunctivae normal.  Cardiovascular:     Rate and Rhythm: Normal rate and regular rhythm.     Heart sounds: Normal heart sounds.  Pulmonary:     Effort: Pulmonary effort is normal. No respiratory distress.     Breath sounds: Normal breath sounds. No wheezing.     Comments: Fair air entry Chest:     Chest wall: No tenderness.  Musculoskeletal:        General: Normal range of motion.     Cervical back: Normal range of motion and neck supple.  Lymphadenopathy:     Cervical: No cervical adenopathy.  Skin:    General: Skin is warm.  Neurological:     General: No focal deficit present.     Mental Status: She is alert and oriented to person, place, and time.  Psychiatric:        Mood and Affect: Mood and affect normal.        Behavior: Behavior normal.      IN-HOUSE Laboratory Results:    No results found for any visits on 03/14/21.   Assessment:    Moderate persistent asthma with acute exacerbation - Plan: predniSONE (DELTASONE) 20 MG tablet  Atypical pneumonia - Plan: azithromycin (ZITHROMAX) 250 MG tablet  Plan:   Will start patient on oral steroids and Azithromycin, and recheck in 1 week. Continue with albuterol PRN.  Meds ordered this encounter  Medications  . azithromycin (ZITHROMAX) 250 MG tablet    Sig: Take 2 tablets (500 mg total) by mouth daily for 3 days.    Dispense:  6 each    Refill:  0  . predniSONE (DELTASONE) 20 MG tablet    Sig: Take 1 tablet (20 mg total) by mouth 2 (two) times daily with a meal for 3 days.    Dispense:  6 tablet    Refill:  0

## 2021-03-15 ENCOUNTER — Encounter: Payer: Self-pay | Admitting: Pediatrics

## 2021-03-15 NOTE — Patient Instructions (Signed)
http://www.aaaai.org/conditions-and-treatments/asthma">  Asthma, Adult  Asthma is a long-term (chronic) condition that causes recurrent episodes in which the airways become tight and narrow. The airways are the passages that lead from the nose and mouth down into the lungs. Asthma episodes, also called asthma attacks, can cause coughing, wheezing, shortness of breath, and chest pain. The airways can also fill with mucus. During an attack, it can be difficult to breathe. Asthma attacks can range from minor to life threatening. Asthma cannot be cured, but medicines and lifestyle changes can help control it and treat acute attacks. What are the causes? This condition is believed to be caused by inherited (genetic) and environmental factors, but its exact cause is not known. There are many things that can bring on an asthma attack or make asthma symptoms worse (triggers). Asthma triggers are different for each person. Common triggers include:  Mold.  Dust.  Cigarette smoke.  Cockroaches.  Things that can cause allergy symptoms (allergens), such as animal dander or pollen from trees or grass.  Air pollutants such as household cleaners, wood smoke, smog, or chemical odors.  Cold air, weather changes, and winds (which increase molds and pollen in the air).  Strong emotional expressions such as crying or laughing hard.  Stress.  Certain medicines (such as aspirin) or types of medicines (such as beta-blockers).  Sulfites in foods and drinks. Foods and drinks that may contain sulfites include dried fruit, potato chips, and sparkling grape juice.  Infections or inflammatory conditions such as the flu, a cold, or inflammation of the nasal membranes (rhinitis).  Gastroesophageal reflux disease (GERD).  Exercise or strenuous activity. What are the signs or symptoms? Symptoms of this condition may occur right after asthma is triggered or many hours later. Symptoms include:  Wheezing. This can  sound like whistling when you breathe.  Excessive nighttime or early morning coughing.  Frequent or severe coughing with a common cold.  Chest tightness.  Shortness of breath.  Tiredness (fatigue) with minimal activity. How is this diagnosed? This condition is diagnosed based on:  Your medical history.  A physical exam.  Tests, which may include: ? Lung function studies and pulmonary studies (spirometry). These tests can evaluate the flow of air in your lungs. ? Allergy tests. ? Imaging tests, such as X-rays. How is this treated? There is no cure for this condition, but treatment can help control your symptoms. Treatment for asthma usually involves:  Identifying and avoiding your asthma triggers.  Using medicines to control your symptoms. Generally, two types of medicines are used to treat asthma: ? Controller medicines. These help prevent asthma symptoms from occurring. They are usually taken every day. ? Fast-acting reliever or rescue medicines. These quickly relieve asthma symptoms by widening the narrow and tight airways. They are used as needed and provide short-term relief.  Using supplemental oxygen. This may be needed during a severe episode.  Using other medicines, such as: ? Allergy medicines, such as antihistamines, if your asthma attacks are triggered by allergens. ? Immune medicines (immunomodulators). These are medicines that help control the immune system.  Creating an asthma action plan. An asthma action plan is a written plan for managing and treating your asthma attacks. This plan includes: ? A list of your asthma triggers and how to avoid them. ? Information about when medicines should be taken and when their dosage should be changed. ? Instructions about using a device called a peak flow meter. A peak flow meter measures how well the lungs are working   and the severity of your asthma. It helps you monitor your condition. Follow these instructions at  home: Controlling your home environment Control your home environment in the following ways to help avoid triggers and prevent asthma attacks:  Change your heating and air conditioning filter regularly.  Limit your use of fireplaces and wood stoves.  Get rid of pests (such as roaches and mice) and their droppings.  Throw away plants if you see mold on them.  Clean floors and dust surfaces regularly. Use unscented cleaning products.  Try to have someone else vacuum for you regularly. Stay out of rooms while they are being vacuumed and for a short while afterward. If you vacuum, use a dust mask from a hardware store, a double-layered or microfilter vacuum cleaner bag, or a vacuum cleaner with a HEPA filter.  Replace carpet with wood, tile, or vinyl flooring. Carpet can trap dander and dust.  Use allergy-proof pillows, mattress covers, and box spring covers.  Keep your bedroom a trigger-free room.  Avoid pets and keep windows closed when allergens are in the air.  Wash beddings every week in hot water and dry them in a dryer.  Use blankets that are made of polyester or cotton.  Clean bathrooms and kitchens with bleach. If possible, have someone repaint the walls in these rooms with mold-resistant paint. Stay out of the rooms that are being cleaned and painted.  Wash your hands often with soap and water. If soap and water are not available, use hand sanitizer.  Do not allow anyone to smoke in your home. General instructions  Take over-the-counter and prescription medicines only as told by your health care provider. ? Speak with your health care provider if you have questions about how or when to take the medicines. ? Make note if you are requiring more frequent dosages.  Do not use any products that contain nicotine or tobacco, such as cigarettes and e-cigarettes. If you need help quitting, ask your health care provider. Also, avoid being exposed to secondhand smoke.  Use a peak  flow meter as told by your health care provider. Record and keep track of the readings.  Understand and use the asthma action plan to help minimize, or stop an asthma attack, without needing to seek medical care.  Make sure you stay up to date on your yearly vaccinations as told by your health care provider. This may include vaccines for the flu and pneumonia.  Avoid outdoor activities when allergen counts are high and when air quality is low.  Wear a ski mask that covers your nose and mouth during outdoor winter activities. Exercise indoors on cold days if you can.  Warm up before exercising, and take time for a cool-down period after exercise.  Keep all follow-up visits as told by your health care provider. This is important. Where to find more information  For information about asthma, turn to the Centers for Disease Control and Prevention at www.cdc.gov/asthma/faqs  For air quality information, turn to AirNow at airnow.gov Contact a health care provider if:  You have wheezing, shortness of breath, or a cough even while you are taking medicine to prevent attacks.  The mucus you cough up (sputum) is thicker than usual.  Your sputum changes from clear or white to yellow, green, gray, or bloody.  Your medicines are causing side effects, such as a rash, itching, swelling, or trouble breathing.  You need to use a reliever medicine more than 2-3 times a week.  Your peak   flow reading is still at 50-79% of your personal best after following your action plan for 1 hour.  You have a fever. Get help right away if:  You are getting worse and do not respond to treatment during an asthma attack.  You are short of breath when at rest or when doing very little physical activity.  You have difficulty eating, drinking, or talking.  You have chest pain or tightness.  You develop a fast heartbeat or palpitations.  You have a bluish color to your lips or fingernails.  You are  light-headed or dizzy, or you faint.  Your peak flow reading is less than 50% of your personal best.  You feel too tired to breathe normally. Summary  Asthma is a long-term (chronic) condition that causes recurrent episodes in which the airways become tight and narrow. These episodes can cause coughing, wheezing, shortness of breath, and chest pain.  Asthma cannot be cured, but medicines and lifestyle changes can help control it and treat acute attacks.  Make sure you understand how to avoid triggers and how and when to use your medicines.  Asthma attacks can range from minor to life threatening. Get help right away if you have an asthma attack and do not respond to treatment with your usual rescue medicines. This information is not intended to replace advice given to you by your health care provider. Make sure you discuss any questions you have with your health care provider. Document Revised: 07/07/2020 Document Reviewed: 02/09/2020 Elsevier Patient Education  2021 [employer] Inc[employer]  

## 2021-03-20 ENCOUNTER — Ambulatory Visit: Payer: Medicaid Other | Admitting: Pediatrics

## 2021-04-16 ENCOUNTER — Ambulatory Visit: Payer: Medicaid Other | Admitting: Pediatrics

## 2021-05-16 ENCOUNTER — Inpatient Hospital Stay (HOSPITAL_COMMUNITY)
Admission: AD | Admit: 2021-05-16 | Discharge: 2021-05-19 | DRG: 918 | Disposition: A | Discharge status: Home / Self Care | Payer: Medicaid Other | Source: Intra-hospital | Attending: Psychiatry | Admitting: Psychiatry

## 2021-05-16 ENCOUNTER — Emergency Department (HOSPITAL_COMMUNITY)
Admission: EM | Admit: 2021-05-16 | Discharge: 2021-05-16 | Disposition: A | Discharge status: Other Acute Inpatient Hospital | Payer: Medicaid Other | Source: Home / Self Care | Attending: Emergency Medicine | Admitting: Emergency Medicine

## 2021-05-16 ENCOUNTER — Encounter (HOSPITAL_COMMUNITY): Payer: Self-pay | Admitting: Psychiatry

## 2021-05-16 ENCOUNTER — Other Ambulatory Visit: Payer: Self-pay

## 2021-05-16 ENCOUNTER — Encounter (HOSPITAL_COMMUNITY): Payer: Self-pay | Admitting: Emergency Medicine

## 2021-05-16 DIAGNOSIS — R45851 Suicidal ideations: Secondary | ICD-10-CM | POA: Diagnosis not present

## 2021-05-16 DIAGNOSIS — Z7289 Other problems related to lifestyle: Secondary | ICD-10-CM

## 2021-05-16 DIAGNOSIS — F39 Unspecified mood [affective] disorder: Secondary | ICD-10-CM | POA: Diagnosis present

## 2021-05-16 DIAGNOSIS — Y92009 Unspecified place in unspecified non-institutional (private) residence as the place of occurrence of the external cause: Secondary | ICD-10-CM

## 2021-05-16 DIAGNOSIS — F1290 Cannabis use, unspecified, uncomplicated: Secondary | ICD-10-CM | POA: Diagnosis present

## 2021-05-16 DIAGNOSIS — Z20822 Contact with and (suspected) exposure to covid-19: Secondary | ICD-10-CM | POA: Diagnosis present

## 2021-05-16 DIAGNOSIS — F988 Other specified behavioral and emotional disorders with onset usually occurring in childhood and adolescence: Secondary | ICD-10-CM | POA: Diagnosis present

## 2021-05-16 DIAGNOSIS — F339 Major depressive disorder, recurrent, unspecified: Principal | ICD-10-CM | POA: Diagnosis present

## 2021-05-16 DIAGNOSIS — Z83438 Family history of other disorder of lipoprotein metabolism and other lipidemia: Secondary | ICD-10-CM

## 2021-05-16 DIAGNOSIS — Z7951 Long term (current) use of inhaled steroids: Secondary | ICD-10-CM | POA: Insufficient documentation

## 2021-05-16 DIAGNOSIS — Z818 Family history of other mental and behavioral disorders: Secondary | ICD-10-CM

## 2021-05-16 DIAGNOSIS — Y900 Blood alcohol level of less than 20 mg/100 ml: Secondary | ICD-10-CM | POA: Insufficient documentation

## 2021-05-16 DIAGNOSIS — R000 Tachycardia, unspecified: Secondary | ICD-10-CM | POA: Insufficient documentation

## 2021-05-16 DIAGNOSIS — T402X2A Poisoning by other opioids, intentional self-harm, initial encounter: Secondary | ICD-10-CM | POA: Diagnosis present

## 2021-05-16 DIAGNOSIS — Z87891 Personal history of nicotine dependence: Secondary | ICD-10-CM | POA: Insufficient documentation

## 2021-05-16 DIAGNOSIS — F332 Major depressive disorder, recurrent severe without psychotic features: Secondary | ICD-10-CM | POA: Insufficient documentation

## 2021-05-16 DIAGNOSIS — I10 Essential (primary) hypertension: Secondary | ICD-10-CM | POA: Diagnosis not present

## 2021-05-16 DIAGNOSIS — F909 Attention-deficit hyperactivity disorder, unspecified type: Secondary | ICD-10-CM | POA: Diagnosis present

## 2021-05-16 DIAGNOSIS — J4530 Mild persistent asthma, uncomplicated: Secondary | ICD-10-CM | POA: Insufficient documentation

## 2021-05-16 DIAGNOSIS — T50902A Poisoning by unspecified drugs, medicaments and biological substances, intentional self-harm, initial encounter: Secondary | ICD-10-CM | POA: Diagnosis present

## 2021-05-16 DIAGNOSIS — Z83511 Family history of glaucoma: Secondary | ICD-10-CM

## 2021-05-16 DIAGNOSIS — Z8249 Family history of ischemic heart disease and other diseases of the circulatory system: Secondary | ICD-10-CM | POA: Diagnosis not present

## 2021-05-16 DIAGNOSIS — Z8261 Family history of arthritis: Secondary | ICD-10-CM

## 2021-05-16 DIAGNOSIS — F419 Anxiety disorder, unspecified: Secondary | ICD-10-CM | POA: Diagnosis present

## 2021-05-16 DIAGNOSIS — R1013 Epigastric pain: Secondary | ICD-10-CM | POA: Insufficient documentation

## 2021-05-16 LAB — RESP PANEL BY RT-PCR (FLU A&B, COVID) ARPGX2
Influenza A by PCR: NEGATIVE
Influenza B by PCR: NEGATIVE
SARS Coronavirus 2 by RT PCR: NEGATIVE

## 2021-05-16 LAB — RAPID URINE DRUG SCREEN, HOSP PERFORMED
Amphetamines: NOT DETECTED
Barbiturates: NOT DETECTED
Benzodiazepines: NOT DETECTED
Cocaine: NOT DETECTED
Opiates: POSITIVE — AB
Tetrahydrocannabinol: POSITIVE — AB

## 2021-05-16 LAB — COMPREHENSIVE METABOLIC PANEL
ALT: 15 U/L (ref 0–44)
AST: 20 U/L (ref 15–41)
Albumin: 4.9 g/dL (ref 3.5–5.0)
Alkaline Phosphatase: 114 U/L (ref 38–126)
Anion gap: 9 (ref 5–15)
BUN: 10 mg/dL (ref 6–20)
CO2: 23 mmol/L (ref 22–32)
Calcium: 9.5 mg/dL (ref 8.9–10.3)
Chloride: 102 mmol/L (ref 98–111)
Creatinine, Ser: 0.71 mg/dL (ref 0.44–1.00)
GFR, Estimated: >60 mL/min (ref >60)
Glucose, Bld: 98 mg/dL (ref 70–99)
Potassium: 3.5 mmol/L (ref 3.5–5.1)
Sodium: 134 mmol/L — ABNORMAL LOW (ref 135–145)
Total Bilirubin: 0.9 mg/dL (ref 0.3–1.2)
Total Protein: 8.9 g/dL — ABNORMAL HIGH (ref 6.5–8.1)

## 2021-05-16 LAB — SALICYLATE LEVEL: Salicylate Lvl: <7 mg/dL — ABNORMAL LOW (ref 7.0–30.0)

## 2021-05-16 LAB — ACETAMINOPHEN LEVEL: Acetaminophen (Tylenol), Serum: <10 ug/mL — ABNORMAL LOW (ref 10–30)

## 2021-05-16 LAB — ETHANOL: Alcohol, Ethyl (B): <10 mg/dL (ref <10)

## 2021-05-16 LAB — CBC
HCT: 48.6 % — ABNORMAL HIGH (ref 36.0–46.0)
Hemoglobin: 16 g/dL — ABNORMAL HIGH (ref 12.0–15.0)
MCH: 29.5 pg (ref 26.0–34.0)
MCHC: 32.9 g/dL (ref 30.0–36.0)
MCV: 89.5 fL (ref 80.0–100.0)
Platelets: 199 10*3/uL (ref 150–400)
RBC: 5.43 MIL/uL — ABNORMAL HIGH (ref 3.87–5.11)
RDW: 12.3 % (ref 11.5–15.5)
WBC: 8.8 10*3/uL (ref 4.0–10.5)
nRBC: 0 % (ref 0.0–0.2)

## 2021-05-16 LAB — POC URINE PREG, ED: Preg Test, Ur: NEGATIVE

## 2021-05-16 MED ORDER — ALUM & MAG HYDROXIDE-SIMETH 200-200-20 MG/5ML PO SUSP: 30.0000 mL | ORAL | Status: DC | PRN

## 2021-05-16 MED ORDER — MONTELUKAST SODIUM 10 MG PO TABS
10.0000 mg | ORAL_TABLET | Freq: Every day | ORAL | Status: DC
  Administered 2021-05-16 – 2021-05-18 (×3): 10 mg via ORAL
  Filled 2021-05-16 (×6): qty 1

## 2021-05-16 MED ORDER — HYDROXYZINE HCL 25 MG PO TABS
25.0000 mg | ORAL_TABLET | Freq: Three times a day (TID) | ORAL | Status: DC | PRN
  Administered 2021-05-17 – 2021-05-18 (×2): 25 mg via ORAL
  Filled 2021-05-16 (×2): qty 1

## 2021-05-16 MED ORDER — TRAZODONE HCL 50 MG PO TABS
50.0000 mg | ORAL_TABLET | Freq: Every evening | ORAL | Status: DC | PRN
  Administered 2021-05-17 – 2021-05-18 (×2): 50 mg via ORAL
  Filled 2021-05-16 (×2): qty 1

## 2021-05-16 MED ORDER — ACETAMINOPHEN 325 MG PO TABS: 650.0000 mg | ORAL_TABLET | Freq: Four times a day (QID) | ORAL | Status: DC | PRN

## 2021-05-16 MED ORDER — ALBUTEROL SULFATE HFA 108 (90 BASE) MCG/ACT IN AERS: 1.0000 | INHALATION_SPRAY | Freq: Four times a day (QID) | RESPIRATORY_TRACT | Status: DC | PRN

## 2021-05-16 MED ORDER — MAGNESIUM HYDROXIDE 400 MG/5ML PO SUSP: 30.0000 mL | Freq: Every day | ORAL | Status: DC | PRN

## 2021-05-16 NOTE — Tx Team (Signed)
Initial Treatment Plan 05/16/2021 7:25 PM Taylor Jefferson HWT:[id number]    PATIENT STRESSORS: Financial difficulties Marital or family conflict Substance abuse   PATIENT STRENGTHS: Ability for insight Capable of independent living Communication skills General fund of knowledge Supportive family/friends   PATIENT IDENTIFIED PROBLEMS: Suicide ideation  Depression  Anxiety                 DISCHARGE CRITERIA:  Ability to meet basic life and health needs Improved stabilization in mood, thinking, and/or behavior Medical problems require only outpatient monitoring Motivation to continue treatment in a less acute level of care Need for constant or close observation no longer present Reduction of life-threatening or endangering symptoms to within safe limits  PRELIMINARY DISCHARGE PLAN: Outpatient therapy Placement in alternative living arrangements  PATIENT/FAMILY INVOLVEMENT: This treatment plan has been presented to and reviewed with the patient, Taylor Jefferson, and/or family member.  The patient and family have been given the opportunity to ask questions and make suggestions.  Margarita Rana, RN 05/16/2021, 7:25 PM

## 2021-05-16 NOTE — ED Provider Notes (Signed)
[facility] EMERGENCY DEPARTMENT Provider Note   CSN: [id number] Arrival date & time: 05/16/21  0028     History Chief Complaint  Patient presents with   V70.1    Taylor Jefferson is a 20 y.o. female.  Patient here after taking oxycodone this morning intentionally trying to hurt herself.  States she took approximately 6 tablets of her mother's 10 mg oxycodones about 10 AM.  She took them all at once.  States she was feeling dizzy and nauseated and sleeping all day.  She then woke up this evening and came to the hospital on her own.  States she is feeling much better after vomiting several times at home.  States she is no longer feeling suicidal but wanted to make sure she was okay from the overdose.  States she has a history of depression and anxiety but does not take any medication for it currently.  Denies wanting to hurt herself currently.  Denies wanting to hurt anyone else or hearing any voices. Denies any abdominal pain.  Denies any chest pain or shortness of breath.  Denies any other ingestion. States she is on no medications currently  The history is provided by the patient.      Past Medical History:  Diagnosis Date   ADD (attention deficit disorder)    Allergy    Amenorrhea 11/02/2014   Anxiety    Asthma    Chlamydia 09/27/2019   tx 12/7, POC________   Contraceptive education 04/04/2014   Contraceptive management 04/29/2014   Death of parent 08-21-20   Depression    HA (headache)    History of chlamydia 10/25/2019   Irregular periods 03/17/2019   Patellar pain    Dr. 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   Precocious puberty 10/08/2011   Reflux    Reflux    Seasonal allergies    Suicidal thoughts 01/06/2018   Vaginal burning 11/02/2014   Vaginal discharge 11/02/2014   Vaginal ulceration 04/04/2014   Will culture for HSV GC/CHL and a wound culture was obtained    Patient Active Problem List   Diagnosis Date Noted   Pregnancy examination  or test, negative result 01/02/2021   Nexplanon in place 01/02/2021   Irregular intermenstrual bleeding 01/02/2021   Non-seasonal allergic rhinitis due to pollen 08/29/2020   Mild persistent asthma without complication 83/41/9622   Flat feet, bilateral 10/09/2018   MDD (major depressive disorder), recurrent severe, without psychosis ([place]) 01/05/2018   Migraine without aura and without status migrainosus, not intractable 12/24/2016   Anxiety state 01/04/2015   Tension headache 01/04/2015   Contraceptive management Apr 29, 2014   Vaginal ulceration 04/04/2014    History reviewed. No pertinent surgical history.   OB History     Gravida  0   Para      Term      Preterm      AB      Living         SAB      IAB      Ectopic      Multiple      Live Births              Family History  Problem Relation Age of Onset   Depression Mother    HIV Mother    Other Mother        spinal stenosis   Glaucoma Mother    Migraines Mother    Heart disease Father    Hypertension  Maternal Grandmother    Arthritis Maternal Grandmother        rheumatoid   Other Maternal Grandmother        acid reflux   Migraines Maternal Grandmother    Depression Maternal Grandmother    Hyperlipidemia Maternal Grandfather    ADD / ADHD Sister    ADD / ADHD Brother     Social History   Tobacco Use   Smoking status: Former   Smokeless tobacco: Never  Scientific laboratory technician Use: Every day   Substances: Nicotine  Substance Use Topics   Alcohol use: No   Drug use: Not Currently    Types: Marijuana    Home Medications Prior to Admission medications   Medication Sig Start Date End Date Taking? Authorizing Provider  albuterol (PROVENTIL) (2.5 MG/3ML) 0.083% nebulizer solution Take 3 mLs (2.5 mg total) by nebulization every 4 (four) hours as needed for wheezing or shortness of breath. 02/13/21   Mannie Stabile, MD  albuterol (VENTOLIN HFA) 108 (90 Base) MCG/ACT inhaler Inhale 2 puffs into  the lungs every 4 (four) hours as needed (Cough). USE WITH SPACER 02/13/21   Mannie Stabile, MD  etonogestrel (NEXPLANON) 68 MG IMPL implant 1 each by Subdermal route once.    [provider]  fluticasone (FLOVENT HFA) 44 MCG/ACT inhaler Inhale 2 puffs into the lungs in the morning and at bedtime. 02/13/21 03/15/21  Mannie Stabile, MD  HUMIRA PEN 40 MG/0.4ML PNKT Inject 40 mg into the skin every 14 (fourteen) days.  04/21/19   [provider]  ibuprofen (ADVIL) 600 MG tablet Take 600 mg by mouth every 8 (eight) hours as needed. 12/26/20   [provider]  montelukast (SINGULAIR) 10 MG tablet Take 1 tablet (10 mg total) by mouth daily. 02/13/21 03/15/21  Mannie Stabile, MD  sodium chloride 0.9 % SOLN 1,000 mL with lidocaine (PF) 1 % SOLN 30 mL, EPINEPHrine 1 MG/ML SOLN 1 mg by Infiltration route. Patient not taking: Reported on 03/14/2021    [provider]  Spacer/Aero-Hold Chamber Mask (MASK VORTEX/CHILD/FROG) MISC 1 Units by Does not apply route as directed. 08/29/20   Pennie Rushing, MD    Allergies    Other  Review of Systems   Review of Systems  Constitutional:  Negative for activity change, appetite change, fatigue and fever.  HENT:  Negative for congestion and rhinorrhea.   Respiratory:  Negative for cough, chest tightness and shortness of breath.   Cardiovascular:  Negative for chest pain.  Gastrointestinal:  Positive for nausea and vomiting. Negative for abdominal pain.  Genitourinary:  Negative for dysuria and hematuria.  Musculoskeletal:  Negative for arthralgias, back pain and gait problem.  Skin:  Negative for rash.  Neurological:  Negative for dizziness, weakness and headaches.  Psychiatric/Behavioral:  Positive for dysphoric mood, self-injury, sleep disturbance and suicidal ideas.    all other systems are negative except as noted in the HPI and PMH.   Physical Exam Updated Vital Signs BP (!) 162/96   Pulse (!) 112   Temp 98.4 F (36.9 C)    Resp 18   Ht '5\' 3"'  (1.6 m)   Wt 60 kg   SpO2 97%   BMI 23.43 kg/m   Physical Exam Vitals and nursing note reviewed.  Constitutional:      General: She is not in acute distress.    Appearance: She is well-developed.  HENT:     Head: Normocephalic and atraumatic.     Mouth/Throat:  Pharynx: No oropharyngeal exudate.  Eyes:     Conjunctiva/sclera: Conjunctivae normal.     Pupils: Pupils are equal, round, and reactive to light.  Neck:     Comments: No meningismus. Cardiovascular:     Rate and Rhythm: Regular rhythm. Tachycardia present.     Heart sounds: Normal heart sounds. No murmur heard. Pulmonary:     Effort: Pulmonary effort is normal. No respiratory distress.     Breath sounds: Normal breath sounds.  Abdominal:     Palpations: Abdomen is soft.     Tenderness: There is abdominal tenderness. There is no guarding or rebound.     Comments: Epigastric tenderneess  Musculoskeletal:        General: No tenderness. Normal range of motion.     Cervical back: Normal range of motion and neck supple.  Skin:    General: Skin is warm.  Neurological:     Mental Status: She is alert and oriented to person, place, and time.     Cranial Nerves: No cranial nerve deficit.     Motor: No abnormal muscle tone.     Coordination: Coordination normal.     Comments:  5/5 strength throughout. CN 2-12 intact.Equal grip strength.   Psychiatric:        Behavior: Behavior normal.    ED Results / Procedures / Treatments   Labs (all labs ordered are listed, but only abnormal results are displayed) Labs Reviewed  COMPREHENSIVE METABOLIC PANEL - Abnormal; Notable for the following components:      Result Value   Sodium 134 (*)    Total Protein 8.9 (*)    All other components within normal limits  SALICYLATE LEVEL - Abnormal; Notable for the following components:   Salicylate Lvl <1.6 (*)    All other components within normal limits  ACETAMINOPHEN LEVEL - Abnormal; Notable for the following  components:   Acetaminophen (Tylenol), Serum <10 (*)    All other components within normal limits  CBC - Abnormal; Notable for the following components:   RBC 5.43 (*)    Hemoglobin 16.0 (*)    HCT 48.6 (*)    All other components within normal limits  RAPID URINE DRUG SCREEN, HOSP PERFORMED - Abnormal; Notable for the following components:   Opiates POSITIVE (*)    Tetrahydrocannabinol POSITIVE (*)    All other components within normal limits  ETHANOL  POC URINE PREG, ED  POC URINE PREG, ED    EKG None  Radiology No results found.  Procedures Procedures   Medications Ordered in ED Medications - No data to display  ED Course  I have reviewed the triage vital signs and the nursing notes.  Pertinent labs & imaging results that were available during my care of the patient were reviewed by me and considered in my medical decision making (see chart for details).    MDM Rules/Calculators/A&P                          Intentional ingestion of oxycodone about 16 hours ago.  Did not contain acetaminophen by her report.  States she is no longer feeling suicidal  Screening labs are reassuring.  Drug screen positive for opiates and THC. Acetaminophen level normal.  Patient is medically clear for TTS evaluation.  She is recommended for inpatient criteria.  She is calm and cooperative and voluntary at this time  Patient medically clear for TTS evaluation.  Inpatient criteria is met. Holding orders placed.  She is calm and cooperative at this time Final Clinical Impression(s) / ED Diagnoses Final diagnoses:  None    Rx / DC Orders ED Discharge Orders     None        Timo Hartwig, Annie Main, MD 05/16/21 [phone]

## 2021-05-16 NOTE — ED Notes (Signed)
Pt one bag of belongings given to pt mother and taken home.

## 2021-05-16 NOTE — ED Notes (Signed)
Pt wanded by security. 
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
## 2021-05-16 NOTE — ED Notes (Signed)
Report received from Heather RN.

## 2021-05-16 NOTE — ED Notes (Signed)
Mother at bedside, report given to Teacher, music for psych transfer

## 2021-05-16 NOTE — ED Triage Notes (Signed)
Pt states she intentionally overdosed on Oxycodone yesterday morning between 9-11am. Pt tearful in triage.

## 2021-05-16 NOTE — Progress Notes (Signed)
Pt accepted to [id number]   Patient meets inpatient criteria per Nira Conn, NP.  Dr.Greg Jola Babinski is the attending provider.    Call report to [phone]   Oletta Darter, RN @ [facility] notified.     Pt scheduled  to arrive at [facility] today by 12 Noon.   Damita Dunnings, MSW, LCSW-A  9:42 AM 05/16/2021

## 2021-05-16 NOTE — ED Notes (Signed)
Patient belongings placed in locker. Clothes  And phone. Pt allowed to keep Piercing's in at this time, and made aware if admitted for [facility] they will need to be removed

## 2021-05-16 NOTE — Progress Notes (Signed)
Psychoeducational Group Note  Date:  05/16/2021 Time:  2114  Group Topic/Focus:  Wrap-Up Group:   The focus of this group is to help patients review their daily goal of treatment and discuss progress on daily workbooks.  Participation Level: Did Not Attend  Participation Quality:  Not Applicable  Affect:  Not Applicable  Cognitive:  Not Applicable  Insight:  Not Applicable  Engagement in Group: Not Applicable  Additional Comments:  The patient did not attend group this evening.   Hazle Coca S 05/16/2021, 9:14 PM

## 2021-05-17 DIAGNOSIS — F419 Anxiety disorder, unspecified: Secondary | ICD-10-CM | POA: Diagnosis present

## 2021-05-17 DIAGNOSIS — F39 Unspecified mood [affective] disorder: Secondary | ICD-10-CM

## 2021-05-17 DIAGNOSIS — F1290 Cannabis use, unspecified, uncomplicated: Secondary | ICD-10-CM | POA: Diagnosis present

## 2021-05-17 DIAGNOSIS — F339 Major depressive disorder, recurrent, unspecified: Secondary | ICD-10-CM | POA: Diagnosis present

## 2021-05-17 MED ORDER — OLANZAPINE 5 MG PO TBDP: 5.0000 mg | ORAL_TABLET | Freq: Three times a day (TID) | ORAL | Status: DC | PRN

## 2021-05-17 MED ORDER — LORAZEPAM 1 MG PO TABS: 1.0000 mg | ORAL_TABLET | ORAL | Status: DC | PRN

## 2021-05-17 MED ORDER — ONDANSETRON HCL 4 MG PO TABS: 4.0000 mg | ORAL_TABLET | Freq: Three times a day (TID) | ORAL | Status: DC | PRN

## 2021-05-17 MED ORDER — ZIPRASIDONE MESYLATE 20 MG IM SOLR: 20.0000 mg | INTRAMUSCULAR | Status: DC | PRN

## 2021-05-17 MED ORDER — IBUPROFEN 600 MG PO TABS
600.0000 mg | ORAL_TABLET | Freq: Three times a day (TID) | ORAL | Status: DC | PRN
  Administered 2021-05-18: 600 mg via ORAL
  Filled 2021-05-17: qty 1
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
## 2021-05-17 NOTE — Progress Notes (Signed)
Patient did not attend wrap up group. 

## 2021-05-17 NOTE — Progress Notes (Signed)
Pt concerned about when her medications were going to be started. Pt educated on the doctor will get verification from her mother and her doctor . Pt given Trazodone and Vistaril PRN per [facility]    05/17/21 2200  Psych Admission Type (Psych Patients Only)  Admission Status Voluntary  Psychosocial Assessment  Patient Complaints Suspiciousness  Eye Contact Fair  Facial Expression Sad  Affect Anxious  Speech Soft  Interaction Guarded;Forwards little  Motor Activity Slow  Appearance/Hygiene Disheveled  Behavior Characteristics Anxious;Guarded  Mood Suspicious  Thought Process  Coherency WDL;Blocking  Content WDL  Delusions WDL  Perception WDL  Hallucination None reported or observed  Judgment Poor  Confusion WDL  Danger to Self  Current suicidal ideation? Denies  Danger to Others  Danger to Others None reported or observed

## 2021-05-17 NOTE — BHH Suicide Risk Assessment (Signed)
[facility] INPATIENT:  Family/Significant Other Suicide Prevention Education  Suicide Prevention Education:  Education Completed; Thomasina Housley [phone] (Mother) has been identified by the patient as the family member/significant other with whom the patient will be residing, and identified as the person(s) who will aid the patient in the event of a mental health crisis (suicidal ideations/suicide attempt).  With written consent from the patient, the family member/significant other has been provided the following suicide prevention education, prior to the and/or following the discharge of the patient.  The suicide prevention education provided includes the following: Suicide risk factors Suicide prevention and interventions National Suicide Hotline telephone number [facility] assessment telephone number [facility] Emergency Assistance 911 [facility] and/or Residential Mobile Crisis Unit telephone number  Request made of family/significant other to: Remove weapons (e.g., guns, rifles, knives), all items previously/currently identified as safety concern.   Remove drugs/medications (over-the-counter, prescriptions, illicit drugs), all items previously/currently identified as a safety concern.  The family member/significant other verbalizes understanding of the suicide prevention education information provided.  The family member/significant other agrees to remove the items of safety concern listed above.  CSW spoke with Mrs. Aispuro who states that her daughter stopped taking her medications approximately 1 year ago because "she told me she got tired of taking them".  Mrs. Mottley also states that her daughter stopped going to all of her outpatient appointments and started using Marijuana to self-medicate.  Mrs. Kleman states that her daughter told there that she took an overdose of Oxycodon and then changed the story to say that it was something in the Marijuana that she recently  smoked.  Mrs. Cervantes states that she is not sure if her daughter was attempting suicide or not.  Mrs. Felicetti states her daughter's previous diagnoses were MDD, ADHD, and Anxiety.  She states that her daughter has been experiencing quick changes in mood, crying spells, difficulty sleeping and often sleeping in the day, depressed moods, and rapid speech.  Mrs. Nop states that there are no firearms or weapons in the home.  CSW completed SPE with Mrs. Mullane.   Metro Kung Alleigh Mollica 05/17/2021, 2:24 PM

## 2021-05-17 NOTE — Progress Notes (Signed)
Pt very guarded and forwards little information this evening.    05/17/21 0000  Psych Admission Type (Psych Patients Only)  Admission Status Voluntary  Psychosocial Assessment  Patient Complaints Suspiciousness  Eye Contact Fair  Facial Expression Sad  Affect Anxious;Blunted  Speech Slow;Soft  Interaction Forwards little;Guarded  Motor Activity Slow  Appearance/Hygiene Disheveled  Behavior Characteristics Guarded  Mood Suspicious;Sad  Aggressive Behavior  Effect No apparent injury  Thought Process  Coherency WDL;Blocking  Content WDL  Delusions WDL  Perception WDL  Hallucination None reported or observed  Judgment Poor  Confusion WDL  Danger to Self  Current suicidal ideation? Denies  Danger to Others  Danger to Others None reported or observed

## 2021-05-17 NOTE — Progress Notes (Signed)
Patient refused to go to group. 
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
## 2021-05-17 NOTE — H&P (Signed)
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 issues will improve  Physician Treatment Plan for Secondary Diagnosis: Principal Problem:   Unspecified mood (affective) disorder ([place]) Active Problems:   Intentional overdose of drug in tablet form ([place])   Marijuana use   Recurrent major depressive disorder ([place])   Anxiety disorder, unspecified  Long Term Goal(s): Improvement in symptoms so as ready for discharge  Short Term Goals: Ability to identify changes in lifestyle to reduce recurrence of condition will improve, Ability to verbalize feelings will improve, Ability to disclose and discuss suicidal ideas, Ability to demonstrate self-control will improve, Ability to identify and develop effective coping behaviors will improve, Ability to maintain clinical  measurements within normal limits will improve, Compliance with prescribed medications will improve, and Ability to identify triggers associated with substance abuse/mental health issues will improve  I certify that inpatient services furnished can reasonably be expected to improve the patient's condition.    Arthor Captain, MD 7/28/20221:49 PM

## 2021-05-18 LAB — TSH: TSH: 2.149 u[IU]/mL (ref 0.350–4.500)

## 2021-05-18 MED ORDER — ALBUTEROL SULFATE HFA 108 (90 BASE) MCG/ACT IN AERS: 2.0000 | INHALATION_SPRAY | Freq: Four times a day (QID) | RESPIRATORY_TRACT | Status: DC | PRN

## 2021-05-18 MED ORDER — BUPROPION HCL ER (XL) 150 MG PO TB24
150.0000 mg | ORAL_TABLET | Freq: Every day | ORAL | Status: DC
  Administered 2021-05-19: 150 mg via ORAL
  Filled 2021-05-18 (×4): qty 1

## 2021-05-18 MED ORDER — OXCARBAZEPINE 150 MG PO TABS
150.0000 mg | ORAL_TABLET | Freq: Two times a day (BID) | ORAL | Status: DC
  Administered 2021-05-18 – 2021-05-19 (×2): 150 mg via ORAL
  Filled 2021-05-18 (×8): qty 1

## 2021-05-18 NOTE — [facility] Group Notes (Signed)
Type of Therapy/Topic: Group Therapy: Six Dimensions of Wellness  Participation Level: Active  Description of Group:  This group will address the concept of wellness and the six concepts of wellness: occupational, physical, social, intellectual, spiritual, and emotional. Patients will be encouraged to process areas in their lives that are out of balance and identify reasons for remaining unbalanced. Patients will be encouraged to explore ways to practice healthy habits daily to attain better physical and mental health outcomes.  Therapeutic Goals:  1. Identify aspects of wellness that they are doing well.  2. Identify aspects of wellness that they would like to improve upon.  3. Identify one action they can take to improve an aspect of wellness in their lives.  Summary of Patient Progress: Taylor Jefferson states that she likes to spend time with her pet and her mother to stay well. The patient accepted the worksheet that was provided and joined in the discussion with their peers.

## 2021-05-18 NOTE — Progress Notes (Signed)
[facility] MD Progress Note  05/18/2021 4:17 PM Taylor Jefferson  MRN:  [id number]  Reason for admission:  Taylor Jefferson is an 20 year old female with past psychiatric history of major depressive disorder, unspecified anxiety disorder, ADHD and cannabis use who presented to [name] ED on the morning of 05/16/2021 stating that she had taken an intentional overdose of six (6) of her mother's oxycodone 10 mg tablets on the morning before.  Patient stated in the ED that she took the oxycodone because she was feeling suicidal and was intentionally trying to hurt herself.  UDS was positive for opiates and THC.  Acetaminophen level was normal.  Objective: Medical record reviewed.  Patient's case discussed in detail with members of the treatment team.  I met with and evaluated the patient on the unit today for follow-up.  Patient appears much the same as yesterday.  She describes her mood is good but presents with stable constricted affect.  She denies SI, AI, PI, AH or VH.  Patient reports that she slept well last night with trazodone.  Appetite is okay.  She reports mild nausea and menstrual cramps but denies any other physical problems.  Patient states that she is interested in pursuing substance abuse treatment to cease marijuana use after discharge.  She is receptive to restarting Trileptal and to undergoing a trial of Wellbutrin for her depression.  I discussed medication target symptoms, indications, anticipated treatment outcomes, risks, benefits, side effects, alternatives, etc, including but not limited to black box warning for antidepressant use in teenagers and young adults.  Patient was provided opportunity to ask questions.  Patient stated understanding of information discussed and provided informed consent to trial of Wellbutrin and Trileptal.  Patient slept 5.75 hours last night.  No new labs today.  Lipid profile, TSH and hemoglobin A1c ordered for the p.m. draw tonight.  Patient did not attend groups  yesterday but is more visible in the day room today and attended some groups.  Collateral information: I made direct phone contact today (05/18/2021) with patient's mother [name] with patient's permission. [name] stated that patient most recently was taking a combination of oxcarbazepine 450 mg daily, Focalin ER 25 mg daily and trazodone 100 mg at bedtime.  [name] perceive the oxcarbazepine to be helpful for Taylor Jefferson in the past.  She states that patient also had trials of Concerta, Prozac and Lexapro years ago in the past.  She could not recall any other medication trials.  [name] stated that she thinks that patient may have felt worse on Lexapro initially.  Mother provided additional collateral information regarding patient's symptoms.  I discussed patient's current symptoms, differential diagnosis, treatment, medication, medication risks (including black box warning for possible increased SI or self-injurious behavior with antidepressant use in the young adult population), aftercare plans, etc.  Patient's mother was provided an opportunity to ask questions.  Mother's questions were answered.  Patient's mother stated understanding of the information discussed and expressed appreciation for the phone call.  Principal Problem: Unspecified mood (affective) disorder (HCC) Diagnosis: Principal Problem:   Unspecified mood (affective) disorder (HCC) Active Problems:   Intentional overdose of drug in tablet form (Taylor Jefferson)   Marijuana use   Recurrent major depressive disorder (HCC)   Anxiety disorder, unspecified  Total Time spent with patient:  25 minutes  Past Psychiatric History: See admission H&P  Past Medical History:  Past Medical History:  Diagnosis Date   ADD (attention deficit disorder)    Allergy  Amenorrhea 11/02/2014   Anxiety    Asthma    Chlamydia 09/27/2019   tx 12/7, POC________   Contraceptive education 04/04/2014   Contraceptive management April 26, 2014   Death of  parent August 18, 2020   Depression    HA (headache)    History of chlamydia 10/25/2019   Irregular periods 03/17/2019   Patellar pain    Dr. 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 No pertinent surgical history. Family History:  Family History  Problem Relation Age of Onset   Depression Mother    HIV Mother    Other Mother        spinal stenosis   Glaucoma Mother    Migraines Mother    Heart disease Father    Hypertension Maternal Grandmother    Arthritis Maternal Grandmother        rheumatoid   Other Maternal Grandmother        acid reflux   Migraines Maternal Grandmother    Depression Maternal Grandmother    Hyperlipidemia Maternal Grandfather    ADD / ADHD Sister    ADD / ADHD Brother    Family Psychiatric  History: See admission H&P Social History:  Social History   Substance and Sexual Activity  Alcohol Use No     Social History   Substance and Sexual Activity  Drug Use Not Currently   Types: Marijuana    Social History   Socioeconomic History   Marital status: Single    Spouse name: Not on file   Number of children: Not on file   Years of education: Not on file   Highest education level: Not on file  Occupational History   Not on file  Tobacco Use   Smoking status: Former   Smokeless tobacco: Never  Vaping Use   Vaping Use: Every day   Substances: Nicotine  Substance and Sexual Activity   Alcohol use: No   Drug use: Not Currently    Types: Marijuana   Sexual activity: Yes    Birth control/protection: Implant  Other Topics Concern   Not on file  Social History Narrative   Taylor Jefferson is an 11th grade student at [employer]  She performs  well in school.    Lives with mother and maternal grandmother. She has paternal half siblings that do not live in the home.   Social Determinants of Health   Financial Resource Strain: Not on file  Food Insecurity: Not on file  Transportation Needs: Not on file  Physical Activity: Not on file  Stress: Not on file  Social Connections: Not on file   Additional Social History:                         Sleep: Good  Appetite:  Good  Current Medications: Current Facility-Administered Medications  Medication Dose Route Frequency Provider Last Rate Last Admin   acetaminophen (TYLENOL) tablet 650 mg  650 mg Oral Q6H PRN Sharma Covert, MD       albuterol (VENTOLIN HFA) 108 (90 Base) MCG/ACT inhaler 1-2 puff  1-2 puff Inhalation Q6H PRN Sharma Covert, MD  alum & mag hydroxide-simeth (MAALOX/MYLANTA) 200-200-20 MG/5ML suspension 30 mL  30 mL Oral Q4H PRN Sharma Covert, MD       [START ON 05/19/2021] buPROPion (WELLBUTRIN XL) 24 hr tablet 150 mg  150 mg Oral Daily Arthor Captain, MD       hydrOXYzine (ATARAX/VISTARIL) tablet 25 mg  25 mg Oral TID PRN Sharma Covert, MD   25 mg at 05/17/21 2115   ibuprofen (ADVIL) tablet 600 mg  600 mg Oral TID PRN Arthor Captain, MD   600 mg at 05/18/21 1400   OLANZapine zydis (ZYPREXA) disintegrating tablet 5 mg  5 mg Oral Q8H PRN Arthor Captain, MD       And   LORazepam (ATIVAN) tablet 1 mg  1 mg Oral PRN Arthor Captain, MD       And   ziprasidone (GEODON) injection 20 mg  20 mg Intramuscular PRN Arthor Captain, MD       magnesium hydroxide (MILK OF MAGNESIA) suspension 30 mL  30 mL Oral Daily PRN Sharma Covert, MD       montelukast (SINGULAIR) tablet 10 mg  10 mg Oral QHS Sharma Covert, MD   10 mg at 05/17/21 2114   ondansetron (ZOFRAN) tablet 4 mg  4 mg Oral Q8H PRN Arthor Captain, MD       OXcarbazepine (TRILEPTAL) tablet 150 mg  150 mg Oral BID Arthor Captain, MD       traZODone (DESYREL) tablet 50 mg  50  mg Oral QHS PRN Sharma Covert, MD   50 mg at 05/17/21 2115    Lab Results: No results found for this or any previous visit (from the past 27 hour(s)).  Blood Alcohol level:  Lab Results  Component Value Date   ETH <10 84/13/2440    Metabolic Disorder Labs: Lab Results  Component Value Date   HGBA1C 3.5 (L) 01/07/2018   MPG 53.75 01/07/2018   Lab Results  Component Value Date   PROLACTIN 55.3 (H) 01/07/2018   Lab Results  Component Value Date   CHOL 224 (H) 01/07/2018   TRIG 101 01/07/2018   HDL 63 01/07/2018   CHOLHDL 3.6 01/07/2018   VLDL 20 01/07/2018   LDLCALC 141 (H) 01/07/2018    Physical Findings: AIMS:  , ,  ,  ,    CIWA:    COWS:     Musculoskeletal: Strength & Muscle Tone: within normal limits Gait & Station: normal Patient leans: N/A  Psychiatric Specialty Exam:  Presentation  General Appearance: Appropriate for Environment; Fairly Groomed  Eye Contact:Good  Speech:Clear and Coherent; Normal Rate  Speech Volume:Normal  Handedness: No data recorded  Mood and Affect  Mood:Anxious  Affect:Constricted   Thought Process  Thought Processes:Coherent; Goal Directed  Descriptions of Associations:Intact  Orientation:Full (Time, Place and Person)  Thought Content:Logical  History of Schizophrenia/Schizoaffective disorder:No  Duration of Psychotic Symptoms:No data recorded Hallucinations:Hallucinations: None  Ideas of Reference:None  Suicidal Thoughts:Suicidal Thoughts: No  Homicidal Thoughts:Homicidal Thoughts: No   Sensorium  Memory:Immediate Good; Recent Good  Judgment:Fair  Insight:Fair   Executive Functions  Concentration:Good  Attention Span:Good  [place] of Knowledge:Good  Language:Good   Psychomotor Activity  Psychomotor Activity:Psychomotor Activity: Normal   Assets  Assets:Communication Skills; Desire for Improvement; Housing; Resilience; Social Support; Leisure Time   Sleep   Sleep:Sleep: Good Number of Hours of Sleep: 5.75    Physical Exam: Physical Exam Vitals and nursing note reviewed.  Constitutional:      General: She is not in acute distress.    Appearance: Normal appearance. She is not diaphoretic.  HENT:     Head: Normocephalic and atraumatic.  Cardiovascular:     Rate and Rhythm: Normal rate.  Pulmonary:     Effort: Pulmonary effort is normal.  Neurological:     General: No focal deficit present.     Mental Status: She is alert and oriented to person, place, and time.   Review of Systems  Constitutional:  Negative for diaphoresis and fever.  HENT:  Negative for sore throat.   Respiratory:  Negative for cough and shortness of breath.   Cardiovascular:  Negative for chest pain and palpitations.  Gastrointestinal:  Positive for nausea. Negative for constipation, diarrhea and vomiting.  Genitourinary:        Positive for menstrual cramps  Musculoskeletal: Negative.   Skin:  Negative for rash.  Neurological:  Negative for dizziness, tremors and headaches.  Psychiatric/Behavioral:  Negative for hallucinations and suicidal ideas. The patient does not have insomnia.   All other systems reviewed and are negative. Vital signs on 05/18/2021: BP 130/92 sitting and 114/90 standing; pulse 94 sitting and 142 standing; O2 sat 100% and temperature of 98. Blood pressure 114/90, pulse (!) 142, temperature 98 F (36.7 C), temperature source Oral, resp. rate 16, height '5\' 2"'  (1.575 m), weight 59.4 kg, SpO2 100 %. Body mass index is 23.96 kg/m.   Treatment Plan Summary: Daily contact with patient to assess and evaluate symptoms and progress in treatment and Medication management  Depression/mood disorder -Start Wellbutrin XL 150 mg daily for depression (first dose 05/19/2021) -Start oxcarbazepine 150 mg twice daily for mood stabilization  Anxiety -Continue hydroxyzine 25 mg TID PRN  Insomnia -Continue trazodone 50 mg QHS PRN  Cannabis use  disorder -Patient is receptive to outpatient substance abuse treatment program referral  Asthma -Continue Singulair 10 mg daily -Continue albuterol inhaler Q6H PRN  Discharge planning in progress.  Patient will need to be reconnected with outpatient therapist and psychiatrist at time of discharge.  Arthor Captain, MD 05/18/2021, 4:17 PM

## 2021-05-18 NOTE — Progress Notes (Signed)
Recreation Therapy Notes  Date:  7.29.22 Time: 0930 Location: [address] Dayroom  Group Topic: Stress Management  Goal Area(s) Addresses:  Patient will identify positive stress management techniques. Patient will identify benefits of using stress management post d/c.  Intervention: Stress Management  Activity :  LRT played a meditation that focused on dealing with procrastination.  Patients were to listen to the meditation as it played.  Education:  Stress Management, Discharge Planning.   Education Outcome: Acknowledges Education  Clinical Observations/Feedback:  Pt did not attend group session.    Caroll Rancher, LRT/CTRS         Caroll Rancher A 05/18/2021 12:30 PM

## 2021-05-18 NOTE — Progress Notes (Signed)
Pt has been A&OX4, calm, cooperative, pleasant, guarded, social with select peers, talks often on the phone with family, smiles on contact. Pt denies SI/HI/AVH. Pt is medication compliant, and complaint with unit routines. PT reports she feels like she is ready to discharge and plans to ask her MD about that tomorrow. Will continue to monitor pt per Q15 minute face checks and monitor for safety and progress.

## 2021-05-18 NOTE — Tx Team (Signed)
Interdisciplinary Treatment and Diagnostic Plan Update  05/18/2021 Time of Session: 9:20am Taylor Jefferson MRN: [id number]  Principal Diagnosis: Unspecified mood (affective) disorder (HCC)  Secondary Diagnoses: Principal Problem:   Unspecified mood (affective) disorder (HCC) Active Problems:   Intentional overdose of drug in tablet form (HCC)   Marijuana use   Recurrent major depressive disorder (HCC)   Anxiety disorder, unspecified   Current Medications:  Current Facility-Administered Medications  Medication Dose Route Frequency Provider Last Rate Last Admin   acetaminophen (TYLENOL) tablet 650 mg  650 mg Oral Q6H PRN Antonieta Pert, MD       albuterol (VENTOLIN HFA) 108 (90 Base) MCG/ACT inhaler 1-2 puff  1-2 puff Inhalation Q6H PRN Antonieta Pert, MD       alum & mag hydroxide-simeth (MAALOX/MYLANTA) 200-200-20 MG/5ML suspension 30 mL  30 mL Oral Q4H PRN Antonieta Pert, MD       hydrOXYzine (ATARAX/VISTARIL) tablet 25 mg  25 mg Oral TID PRN Antonieta Pert, MD   25 mg at 05/17/21 2115   ibuprofen (ADVIL) tablet 600 mg  600 mg Oral TID PRN Claudie Revering, MD   600 mg at 05/18/21 1400   OLANZapine zydis (ZYPREXA) disintegrating tablet 5 mg  5 mg Oral Q8H PRN Claudie Revering, MD       And   LORazepam (ATIVAN) tablet 1 mg  1 mg Oral PRN Claudie Revering, MD       And   ziprasidone (GEODON) injection 20 mg  20 mg Intramuscular PRN Claudie Revering, MD       magnesium hydroxide (MILK OF MAGNESIA) suspension 30 mL  30 mL Oral Daily PRN Antonieta Pert, MD       montelukast (SINGULAIR) tablet 10 mg  10 mg Oral QHS Antonieta Pert, MD   10 mg at 05/17/21 2114   ondansetron (ZOFRAN) tablet 4 mg  4 mg Oral Q8H PRN Claudie Revering, MD       traZODone (DESYREL) tablet 50 mg  50 mg Oral QHS PRN Antonieta Pert, MD   50 mg at 05/17/21 2115   PTA Medications: Medications Prior to Admission  Medication Sig Dispense Refill Last Dose   albuterol (PROVENTIL) (2.5 MG/3ML)  0.083% nebulizer solution Take 3 mLs (2.5 mg total) by nebulization every 4 (four) hours as needed for wheezing or shortness of breath. 75 mL 0    albuterol (VENTOLIN HFA) 108 (90 Base) MCG/ACT inhaler Inhale 2 puffs into the lungs every 4 (four) hours as needed (Cough). USE WITH SPACER 36 g 0    etonogestrel (NEXPLANON) 68 MG IMPL implant 1 each by Subdermal route once.      fluticasone (FLOVENT HFA) 44 MCG/ACT inhaler Inhale 2 puffs into the lungs in the morning and at bedtime. (Patient not taking: No sig reported) 1 each 5    Spacer/Aero-Hold Chamber Mask (MASK VORTEX/CHILD/FROG) MISC 1 Units by Does not apply route as directed. 2 each 1     Patient Stressors: Financial difficulties Marital or family conflict Substance abuse  Patient Strengths: Ability for insight Capable of independent living Communication skills General fund of knowledge Supportive family/friends  Treatment Modalities: Medication Management, Group therapy, Case management,  1 to 1 session with clinician, Psychoeducation, Recreational therapy.   Physician Treatment Plan for Primary Diagnosis: Unspecified mood (affective) disorder (HCC) Long Term Goal(s): Improvement in symptoms so as ready for discharge   Short Term Goals: Ability to identify changes in lifestyle to reduce recurrence  of condition will improve Ability to verbalize feelings will improve Ability to disclose and discuss suicidal ideas Ability to demonstrate self-control will improve Ability to identify and develop effective coping behaviors will improve Ability to maintain clinical measurements within normal limits will improve Compliance with prescribed medications will improve Ability to identify triggers associated with substance abuse/mental health issues will improve  Medication Management: Evaluate patient's response, side effects, and tolerance of medication regimen.  Therapeutic Interventions: 1 to 1 sessions, Unit Group sessions and  Medication administration.  Evaluation of Outcomes: Progressing  Physician Treatment Plan for Secondary Diagnosis: Principal Problem:   Unspecified mood (affective) disorder (HCC) Active Problems:   Intentional overdose of drug in tablet form (HCC)   Marijuana use   Recurrent major depressive disorder (HCC)   Anxiety disorder, unspecified  Long Term Goal(s): Improvement in symptoms so as ready for discharge   Short Term Goals: Ability to identify changes in lifestyle to reduce recurrence of condition will improve Ability to verbalize feelings will improve Ability to disclose and discuss suicidal ideas Ability to demonstrate self-control will improve Ability to identify and develop effective coping behaviors will improve Ability to maintain clinical measurements within normal limits will improve Compliance with prescribed medications will improve Ability to identify triggers associated with substance abuse/mental health issues will improve     Medication Management: Evaluate patient's response, side effects, and tolerance of medication regimen.  Therapeutic Interventions: 1 to 1 sessions, Unit Group sessions and Medication administration.  Evaluation of Outcomes: Progressing   RN Treatment Plan for Primary Diagnosis: Unspecified mood (affective) disorder (HCC) Long Term Goal(s): Knowledge of disease and therapeutic regimen to maintain health will improve  Short Term Goals: Ability to participate in decision making will improve, Ability to verbalize feelings will improve, and Ability to disclose and discuss suicidal ideas  Medication Management: RN will administer medications as ordered by provider, will assess and evaluate patient's response and provide education to patient for prescribed medication. RN will report any adverse and/or side effects to prescribing provider.  Therapeutic Interventions: 1 on 1 counseling sessions, Psychoeducation, Medication administration, Evaluate  responses to treatment, Monitor vital signs and CBGs as ordered, Perform/monitor CIWA, COWS, AIMS and Fall Risk screenings as ordered, Perform wound care treatments as ordered.  Evaluation of Outcomes: Progressing   LCSW Treatment Plan for Primary Diagnosis: Unspecified mood (affective) disorder (HCC) Long Term Goal(s): Safe transition to appropriate next level of care at discharge, Engage patient in therapeutic group addressing interpersonal concerns.  Short Term Goals: Engage patient in aftercare planning with referrals and resources, Increase social support, and Increase ability to appropriately verbalize feelings  Therapeutic Interventions: Assess for all discharge needs, 1 to 1 time with Social worker, Explore available resources and support systems, Assess for adequacy in community support network, Educate family and significant other(s) on suicide prevention, Complete Psychosocial Assessment, Interpersonal group therapy.  Evaluation of Outcomes: Progressing   Progress in Treatment: Attending groups: Yes. Participating in groups: Yes. Taking medication as prescribed: Yes. Toleration medication: Yes. Family/Significant other contact made: Yes, individual(s) contacted:  mother Patient understands diagnosis: Yes. Discussing patient identified problems/goals with staff: Yes. Medical problems stabilized or resolved: Yes. Denies suicidal/homicidal ideation: Yes. Issues/concerns per patient self-inventory: No. Other: None  New problem(s) identified: No, Describe:  None  New Short Term/Long Term Goal(s):medication stabilization, elimination of SI thoughts, development of comprehensive mental wellness plan.   Patient Goals:  "Be a lot better"  Discharge Plan or Barriers: Patient recently admitted. CSW will continue to follow and  assess for appropriate referrals and possible discharge planning.   Reason for Continuation of Hospitalization: Depression Medication  stabilization Suicidal ideation  Estimated Length of Stay: 3-5 days  Attendees: Patient: Taylor Jefferson 05/18/2021   Physician: Dr. Renaldo Fiddler 05/18/2021   Nursing:  05/18/2021   RN Care Manager: 05/18/2021   Social Worker: Fredirick Lathe, LCSWA 05/18/2021   Recreational Therapist:  05/18/2021   Other:  05/18/2021   Other:  05/18/2021   Other: 05/18/2021     Scribe for Treatment Team: Felizardo Hoffmann, Theresia Majors 05/18/2021 2:36 PM

## 2021-05-19 DIAGNOSIS — F332 Major depressive disorder, recurrent severe without psychotic features: Secondary | ICD-10-CM

## 2021-05-19 LAB — LIPID PANEL
Cholesterol: 256 mg/dL — ABNORMAL HIGH (ref 0–169)
HDL: 40 mg/dL — ABNORMAL LOW (ref >40)
LDL Cholesterol: 180 mg/dL — ABNORMAL HIGH (ref 0–99)
Total CHOL/HDL Ratio: 6.4 RATIO
Triglycerides: 180 mg/dL — ABNORMAL HIGH (ref <150)
VLDL: 36 mg/dL (ref 0–40)

## 2021-05-19 MED ORDER — OXCARBAZEPINE 150 MG PO TABS: 150.0000 mg | ORAL_TABLET | Freq: Two times a day (BID) | ORAL | 0 refills | Status: DC

## 2021-05-19 MED ORDER — HYDROXYZINE HCL 25 MG PO TABS: 25.0000 mg | ORAL_TABLET | Freq: Three times a day (TID) | ORAL | 0 refills | Status: DC | PRN

## 2021-05-19 MED ORDER — MONTELUKAST SODIUM 10 MG PO TABS: 10.0000 mg | ORAL_TABLET | Freq: Every day | ORAL | 0 refills | Status: DC

## 2021-05-19 MED ORDER — BUPROPION HCL ER (XL) 150 MG PO TB24: 150.0000 mg | ORAL_TABLET | Freq: Every day | ORAL | 0 refills | Status: DC

## 2021-05-19 MED ORDER — TRAZODONE HCL 50 MG PO TABS: 50.0000 mg | ORAL_TABLET | Freq: Every evening | ORAL | 0 refills | Status: DC | PRN

## 2021-05-19 NOTE — Progress Notes (Signed)
Discharge Note:  Patient denies SI/HI at this time. Discharge instructions, AVS, prescriptions gone over with patient. Patient agrees to comply with medication management, follow-up visit, and outpatient therapy. Patient and family questions and concerns addressed and answered. Patient discharged to home with parent.

## 2021-05-19 NOTE — [facility] Group Notes (Signed)
LCSW Group Therapy Note  05/19/2021   10:00-11:00am   Type of Therapy and Topic:  Group Therapy: Anger Cues and Responses  Participation Level:  Active   Description of Group:   In this group, patients learned how to recognize the physical, cognitive, emotional, and behavioral responses they have to anger-provoking situations.  They identified a recent time they became angry and how they reacted.  They analyzed how their reaction was possibly beneficial and how it was possibly unhelpful.  The group discussed a variety of healthier coping skills that could help with such a situation in the future.  Focus was placed on how helpful it is to recognize the underlying emotions to our anger, because working on those can lead to a more permanent solution as well as our ability to focus on the important rather than the urgent.  Therapeutic Goals: Patients will remember their last incident of anger and how they felt emotionally and physically, what their thoughts were at the time, and how they behaved. Patients will identify how their behavior at that time worked for them, as well as how it worked against them. Patients will explore possible new behaviors to use in future anger situations. Patients will learn that anger itself is normal and cannot be eliminated, and that healthier reactions can assist with resolving conflict rather than worsening situations.  Summary of Patient Progress:  The patient shared that her most recent time of anger was Tuesday with her boyfriend because they had a disagreement and said she thought he had given up on her.  This made her hard on herself and she was throwing things around the room, yelling, and such.  Her mother had to intervene and get her to stop.  Therapeutic Modalities:   Cognitive Behavioral Therapy  Lynnell Chad

## 2021-05-19 NOTE — [facility] Group Notes (Signed)
[facility] Group Notes:  (Nursing/MHT/Case Management/Adjunct)  Date:  05/19/2021  Time:  10:00 AM  Type of Therapy:  Group Therapy  Participation Level:  Active  Participation Quality:  Appropriate  Affect:  Appropriate  Cognitive:  Appropriate  Insight:  Appropriate  Engagement in Group:  Engaged  Modes of Intervention:  Discussion and Education  Summary of Progress/Problems: Pt was alert and active throughout group. No SI or HI was endorsed. Pt stated her goal is to complete discharge plan.  Shakthi Scipio N Ponce Skillman 05/19/2021, 10:00 AM

## 2021-05-19 NOTE — Progress Notes (Signed)
   05/19/21 0000  Psych Admission Type (Psych Patients Only)  Admission Status Voluntary  Psychosocial Assessment  Patient Complaints None  Eye Contact Fair  Facial Expression Sad  Affect Anxious  Speech Soft  Interaction Guarded;Forwards little  Motor Activity Slow  Appearance/Hygiene Unremarkable  Behavior Characteristics Cooperative  Mood Pleasant  Thought Process  Coherency WDL  Content WDL  Delusions WDL  Perception WDL  Hallucination None reported or observed  Judgment Limited  Confusion WDL  Danger to Self  Current suicidal ideation? Denies  Danger to Others  Danger to Others None reported or observed

## 2021-05-19 NOTE — Discharge Summary (Signed)
Physician Discharge Summary Note  Patient:  Taylor Jefferson is an 20 y.o., female MRN:  [id number] DOB:  December 05, 2000 Patient phone:  [phone] (home)  Patient address:   [address] [place] [address],  Total Time spent with patient:  Greater than 30 minutes  Date of Admission:  05/16/2021 Date of Discharge: 05-19-21  Reason for Admission: Patient reports on admission that she had taken an intentional overdose of six (6) of her mother's oxycodone 10 mg tablets on the morning prior because she was feeling suicidal & was intentionally trying to hurt herself.   Principal Problem: Recurrent major depressive disorder [facility]) Discharge Diagnoses: Principal Problem:   Recurrent major depressive disorder (HCC) Active Problems:   Intentional overdose of drug in tablet form (HCC)   Marijuana use   Unspecified mood (affective) disorder (HCC)   Anxiety disorder, unspecified  Past Psychiatric History:  Major depressive disorder, recurrent episodes.  Past Medical History:  Past Medical History:  Diagnosis Date   ADD (attention deficit disorder)    Allergy    Amenorrhea 11/02/2014   Anxiety    Asthma    Chlamydia 09/27/2019   tx 12/7, POC________   Contraceptive education 04/04/2014   Contraceptive management 05/07/2014   Death of parent 08-29-20   Depression    HA (headache)    History of chlamydia 10/25/2019   Irregular periods 03/17/2019   Patellar pain    Dr. 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 No pertinent surgical history. Family History:  Family History  Problem Relation Age of Onset   Depression Mother    HIV Mother    Other Mother        spinal  stenosis   Glaucoma Mother    Migraines Mother    Heart disease Father    Hypertension Maternal Grandmother    Arthritis Maternal Grandmother        rheumatoid   Other Maternal Grandmother        acid reflux   Migraines Maternal Grandmother    Depression Maternal Grandmother    Hyperlipidemia Maternal Grandfather    ADD / ADHD Sister    ADD / ADHD Brother    Family Psychiatric  History: See H&P  Social History:  Social History   Substance and Sexual Activity  Alcohol Use No     Social History   Substance and Sexual Activity  Drug Use Not Currently   Types: Marijuana    Social History   Socioeconomic History   Marital status: Single    Spouse name: Not on file   Number of children: Not on file   Years of education: Not on file   Highest education level: Not on file  Occupational History   Not on file  Tobacco Use   Smoking status: Former   Smokeless tobacco: Never  Vaping Use   Vaping Use: Every day   Substances: Nicotine  Substance and Sexual Activity   Alcohol use: No   Drug use: Not Currently    Types: Marijuana   Sexual activity: Yes    Birth control/protection: Implant  Other Topics Concern   Not on file  Social History  Narrative   Secret is an 11th grade student at [employer].  She performs well in school.    Lives with mother and maternal grandmother. She has paternal half siblings that do not live in the home.   Social Determinants of Health   Financial Resource Strain: Not on file  Food Insecurity: Not on file  Transportation Needs: Not on file  Physical Activity: Not on file  Stress: Not on file  Social Connections: Not on file   Hospital Course: (Per Md's admission evaluation notes): Taylor Jefferson is an 20 year old female with past psychiatric history of major depressive disorder, unspecified anxiety disorder, ADHD and cannabis use who presented to [facility] ED on the morning of 05/16/2021 stating that she had taken an  intentional overdose of six (6) of her mother's oxycodone 10 mg tablets on the morning before.  Patient stated in the ED that she took the oxycodone because she was feeling suicidal and was intentionally trying to hurt herself.  She subsequently felt dizzy, tired, nauseated and vomited several times at home.  In the ED, the patient stated she was no longer feeling suicidal but wanted to make sure she was okay from the overdose.  UDS was positive for opiates and THC.  Acetaminophen level was normal.  Patient told multiple providers in the ED that she took the oxycodone intentionally with the goal of killing herself and stated that she waited for her mother to leave her alone at home to provide patient opportunity to take the pills.  Patient was deemed medically stable for inpatient psychiatric admission and was transferred last night to [facility].  On evaluation with me this morning, patient appears anxious with stable constricted affect and organized thought processes.  Patient appears to be minimizing symptoms previously reported in the ED.  She states that she was "too scared to tell the true story about the overdose" when she was in the emergency room.  Patient tells me today that her friends put "Oxy" in the marijuana she was smoking on the day prior to ED presentation.  Patient states that she called her friends after she started to feel sick and friends "threatened to throw another rock through my window or kill me if I told."  Patient states that she knows how overdoses work so she went to the hospital to be evaluated for any medical consequences of taking the oxycodone. She describes her mood in recent weeks as "happy".  She denies depressed mood, anhedonia, change in appetite, change in sleep, change in energy, problems concentrating currently or prior to admission.  She denies that she was experiencing wish for death, suicidal ideation or thoughts of harming herself in the weeks prior to admission and denies  experiencing suicidal ideation, wish for death, or thoughts of self-harm currently.  Additionally she denies AI, HI, PI, AH or VH.  Patient states she is looking forward to seeing her boyfriend and her puppy after discharge.  Patient states she was taking psychiatric meds until a few months ago when she stopped taking them because she planned to join the [employer] (but ultimately decided not to join the [employer]).  She states that she would like to get re-connected with her previous psychiatrist Thedore Mins MD in [place]) and resume taking medications.  She does not know the name of the medication she was taking most recently but states her mother might know.  Patient states that she has seen a therapist in the past but has not seen her  therapist recently.  She would like to return to therapy with the same therapist. Patient reports use of marijuana daily (1-2 blunts per day) to cope with feelings about her father's death.  Onset of marijuana use was in 2019 and onset of daily use was in 2021.  Patient states she has an upcoming court date for marijuana possession on August 3.  She reports infrequent use of alcohol (approximately once per month, up to 4 shots per occasion) with last use couple of weeks ago.  She denies any history of legal issues, withdrawal symptoms or other consequences of alcohol use.  She vapes nicotine daily.  Patient denies use of other drugs or tobacco. Patient has a history of asthma and uses albuterol nebulizer and more frequently albuterol rescue inhaler.  She denies any history of seizure, concussion, diabetes, surgery or other medical conditions.  She has no known drug allergies.   Prior to this discharge, Leone was seen & evaluated for mental health stability. The current laboratory findings were reviewed (stable), nurses notes & vital signs were reviewed as well. There are no current mental health or medical issues that should prevent this discharge at this time. Patient is being  discharged to continue mental health care as noted below.   This is the second psychiatric admissions/discharge summaries for this 20 year old AA female. She has been a patient in the adolescent unit of this [facility] in 2019 with similar presentation. At that time. Patient stated that she became suicidal & wanted to be with her deceased father after an argument with her mother. She received mood stabilization treatments at the time. She was admitted to the [facility] adult unit this time around for evaluation & treatment after taken an intentional overdose of six (6) of her mother's oxycodone 10 mg tablets on the morning prior because she was feeling suicidal & was intentionally trying to hurt herself.        After the above admission evaluation, patient's presenting symptoms were noted. She was recommended for mood stabilization treatments. The medication regimen targeting those presenting symptoms were discussed with her & initiated with her consent. She was medicated, stabilized & discharged on the medications as listed on her discharge medication lists below. Besides the mood stabilization treatments, Esthela was also enrolled & participated in the group counseling sessions being offered & held on this unit. She learned coping skills. She presented other significant pre-existing medical issues that required treatment. She was resumed & discharged on all her pertinent home medications for those health issues. She tolerated her treatment regimen without any adverse effects or reactions. Patient's symptoms responded well to her treatment regimen warranting this discharge. She is also agreeable to discharge.   Kasidi currently presents mentally & medically stable to be discharged to continue mental health care & medication management on an outpatient as noted below. At this time of her hospital discharge, she presents alert, attentive, well related, pleasant, mood improved & currently presents euthymic. Her affect is  appropriate & positively reactive, no thought disorder noted, no suicidal or self injurious ideations reported, no homicidal or violent ideations present, no hallucinations, no delusions, not internally preoccupied. She is future oriented. Her behavior on the unit was calm & in good control. She denies any medication side effects, which we reviewed with her. She will continue further mental health care & medication management on an outpatient basis as noted below. She is provided with all the necessary information needed to make this appointment without problems. Crystalann was  able to engage in safety planning including plan to return to [facility] or contact emergency services if she feels unable to maintain her own safety or the safety of others. Pt had no further questions, comments or concerns.  She left [facility]HH in no apparent distress with all personal belongings. Transportation per self with her own car.    Physical Findings: AIMS:  , ,  ,  ,    CIWA:    COWS:     Musculoskeletal: Strength & Muscle Tone: within normal limits Gait & Station: normal Patient leans: N/A  Psychiatric Specialty Exam:  Presentation  General Appearance: Casual  Eye Contact:Good  Speech:Normal Rate  Speech Volume:Normal  Handedness:Right  Mood and Affect  Mood:Euthymic  Affect:Appropriate  Thought Process  Thought Processes:Coherent  Descriptions of Associations:Intact  Orientation:Full (Time, Place and Person)  Thought Content:Logical  History of Schizophrenia/Schizoaffective disorder:No  Duration of Psychotic Symptoms:No data recorded Hallucinations:Hallucinations: None  Ideas of Reference:None  Suicidal Thoughts:Suicidal Thoughts: No  Homicidal Thoughts:Homicidal Thoughts: No  Sensorium  Memory:Immediate Good; Recent Good; Remote Good  Judgment:Good  Insight:Good  Executive Functions  Concentration:Good  Attention Span:Good  Recall:Good  Fund of  Knowledge:Good  Language:Good  Psychomotor Activity  Psychomotor Activity:Psychomotor Activity: Normal  Assets  Assets:Desire for Improvement; Manufacturing systems engineer; Financial Resources/Insurance; Resilience; Social Support; Housing; Talents/Skills  Sleep  Sleep:Sleep: Good Number of Hours of Sleep: 6.5  Physical Exam: Physical Exam Vitals and nursing note reviewed.  HENT:     Head: Normocephalic.     Nose: Nose normal.     Mouth/Throat:     Pharynx: Oropharynx is clear.  Eyes:     Pupils: Pupils are equal, round, and reactive to light.  Cardiovascular:     Rate and Rhythm: Normal rate.     Pulses: Normal pulses.  Pulmonary:     Effort: Pulmonary effort is normal.  Genitourinary:    Comments: Deferred Musculoskeletal:        General: Normal range of motion.     Cervical back: Normal range of motion.  Skin:    General: Skin is warm and dry.  Neurological:     General: No focal deficit present.     Mental Status: She is alert and oriented to person, place, and time. Mental status is at baseline.  Psychiatric:        Attention and Perception: Attention and perception normal.        Mood and Affect: Mood is not anxious, depressed or elated. Affect is not labile, blunt, flat, angry, tearful or inappropriate.        Speech: She is communicative. Speech is not rapid and pressured, delayed, slurred or tangential.        Behavior: Behavior normal. Behavior is not agitated, slowed, aggressive, withdrawn, hyperactive or combative. Behavior is cooperative.        Thought Content: Thought content is not paranoid or delusional. Thought content does not include homicidal or suicidal ideation. Thought content does not include homicidal or suicidal plan.        Cognition and Memory: Cognition is not impaired. Memory is not impaired. She does not exhibit impaired recent memory or impaired remote memory.        Judgment: Judgment normal.   Review of Systems  Constitutional:  Negative for  chills and fever.  HENT:  Negative for congestion and sore throat.   Eyes:  Negative for blurred vision.  Respiratory:  Negative for cough, shortness of breath and wheezing.   Cardiovascular:  Negative for  chest pain and palpitations.  Gastrointestinal:  Negative for abdominal pain, constipation, diarrhea, heartburn, nausea and vomiting.  Genitourinary:  Negative for dysuria.  Musculoskeletal:  Negative for joint pain and myalgias.  Skin: Negative.   Neurological:  Negative for dizziness, tingling, tremors, sensory change, speech change, focal weakness, seizures, loss of consciousness, weakness and headaches.  Endo/Heme/Allergies:  Positive for environmental allergies (Seasonal allergies (stable).).  Psychiatric/Behavioral:  Positive for depression (Hx of (stable on medication)) and substance abuse (Hx. Cocaine/THC use disorders.). Negative for hallucinations, memory loss and suicidal ideas. The patient has insomnia (Hx of (stable on medication)). The patient is not nervous/anxious (Stable upon discharge).   Blood pressure 114/90, pulse (!) 142, temperature 98 F (36.7 C), temperature source Oral, resp. rate 16, height  (1.575 m), weight 59.4 kg, SpO2 100 %. Body mass index is 23.96 kg/m.  Social History   Tobacco Use  Smoking Status Former  Smokeless Tobacco Never   Tobacco Cessation:  N/A, patient does not currently use tobacco products  Blood Alcohol level:  Lab Results  Component Value Date   ETH <10 05/16/2021   Metabolic Disorder Labs:  Lab Results  Component Value Date   HGBA1C 3.5 (L) 01/07/2018   MPG 53.75 01/07/2018   Lab Results  Component Value Date   PROLACTIN 55.3 (H) 01/07/2018   Lab Results  Component Value Date   CHOL 256 (H) 05/18/2021   TRIG 180 (H) 05/18/2021   HDL 40 (L) 05/18/2021   CHOLHDL 6.4 05/18/2021   VLDL 36 05/18/2021   LDLCALC 180 (H) 05/18/2021   LDLCALC 141 (H) 01/07/2018   See Psychiatric Specialty Exam and Suicide Risk  Assessment completed by Attending Physician prior to discharge.  Discharge destination:  Home  Is patient on multiple antipsychotic therapies at discharge:  No   Has Patient had three or more failed trials of antipsychotic monotherapy by history:  No  Recommended Plan for Multiple Antipsychotic Therapies: NA  Allergies as of 05/19/2021       Reactions   Other Other (See Comments)   Seasonal - runny nose, itchy eyes        Medication List     STOP taking these medications    Mask Vortex/Child/Frog Misc       TAKE these medications      Indication  albuterol 108 (90 Base) MCG/ACT inhaler Commonly known as: VENTOLIN HFA Inhale 2 puffs into the lungs every 4 (four) hours as needed (Cough). USE WITH SPACER  Indication: Asthma   albuterol (2.5 MG/3ML) 0.083% nebulizer solution Commonly known as: PROVENTIL Take 3 mLs (2.5 mg total) by nebulization every 4 (four) hours as needed for wheezing or shortness of breath.  Indication: Spasm of Lung Air Passages   buPROPion 150 MG 24 hr tablet Commonly known as: WELLBUTRIN XL Take 1 tablet (150 mg total) by mouth daily. For depression Start taking on: May 20, 2021  Indication: Major Depressive Disorder   Flovent HFA 44 MCG/ACT inhaler Generic drug: fluticasone Inhale 2 puffs into the lungs in the morning and at bedtime.    hydrOXYzine 25 MG tablet Commonly known as: ATARAX/VISTARIL Take 1 tablet (25 mg total) by mouth 3 (three) times daily as needed for anxiety.  Indication: Feeling Anxious   montelukast 10 MG tablet Commonly known as: SINGULAIR Take 1 tablet (10 mg total) by mouth at bedtime.  Indication: Asthma   Nexplanon 68 MG Impl implant Generic drug: etonogestrel 1 each by Subdermal route once.  Indication: Birth Control Treatment  OXcarbazepine 150 MG tablet Commonly known as: TRILEPTAL Take 1 tablet (150 mg total) by mouth 2 (two) times daily. For mood stabilization  Indication: Mood stabilization    traZODone 50 MG tablet Commonly known as: DESYREL Take 1 tablet (50 mg total) by mouth at bedtime as needed for sleep.  Indication: Trouble Sleeping        Follow-up Information     Thedore Mins, MD Follow up on 07/06/2021.   Specialty: Psychiatry Why: You have a re-assessment appointment for medication management services on 07/06/21 at 11:00 am.  This will be a Virtual appointment.  * PLEASE RETURN THE REASSESSMENT PACKET THEY WILL SEND YOU, PRIOR TO YOUR APPT. Contact information: [address] [place] [address] [phone]         BEHAVIORAL HEALTH OUTPATIENT THERAPY [place] Follow up on 05/31/2021.   Specialty: Behavioral Health Why: You have an appointment for therapy services on 05/31/21 at 11:00 am with  Alvera Singh.  This will be a  Virtual appointment. Contact information: [address] [id number] mc [place] [address] [phone]        ADS. Go to.   Why: Please go to this provider for substance use intensive outpatient therapy services, Monday through Friday between 6:00 am and 10:30 am. Contact information: [address] [place], [place] [address]  P: [phone] F: [phone]               Follow-up recommendations: Activity:  As tolerated Diet: As recommended by your primary care doctor. Keep all scheduled follow-up appointments as recommended.    Comments: Prescriptions given at discharge.  Patient agreeable to plan.  Given opportunity to ask questions.  Appears to feel comfortable with discharge denies any current suicidal or homicidal thought. Patient is also instructed prior to discharge to: Take all medications as prescribed by his/her mental healthcare provider. Report any adverse effects and or reactions from the medicines to his/her outpatient provider promptly. Patient has been instructed & cautioned: To not engage in alcohol and or illegal drug use while on prescription medicines. In the  event of worsening symptoms, patient is instructed to call the crisis hotline, 911 and or go to the nearest ED for appropriate evaluation and treatment of symptoms. To follow-up with his/her primary care provider for your other medical issues, concerns and or health care needs.   Signed: Armandina Stammer, NP, [id number], [id number] 05/19/2021, 12:51 PM

## 2021-05-19 NOTE — [facility] Suicide Risk Assessment (Signed)
[facility] Discharge Suicide Risk Assessment   Principal Problem: Unspecified mood (affective) disorder (HCC) Discharge Diagnoses: Principal Problem:   Unspecified mood (affective) disorder (HCC) Active Problems:   Intentional overdose of drug in tablet form (HCC)   Marijuana use   Recurrent major depressive disorder (HCC)   Anxiety disorder, unspecified   Total Time spent with patient: 20 minutes  Musculoskeletal: Strength & Muscle Tone: within normal limits Gait & Station: normal Patient leans: N/A  Psychiatric Specialty Exam  Presentation  General Appearance: Casual  Eye Contact:Good  Speech:Normal Rate  Speech Volume:Normal  Handedness:Right   Mood and Affect  Mood:Euthymic  Duration of Depression Symptoms: Greater than two weeks  Affect:Appropriate   Thought Process  Thought Processes:Coherent  Descriptions of Associations:Intact  Orientation:Full (Time, Place and Person)  Thought Content:Logical  History of Schizophrenia/Schizoaffective disorder:No  Duration of Psychotic Symptoms:No data recorded Hallucinations:Hallucinations: None  Ideas of Reference:None  Suicidal Thoughts:Suicidal Thoughts: No  Homicidal Thoughts:Homicidal Thoughts: No   Sensorium  Memory:Immediate Good; Recent Good; Remote Good  Judgment:Good  Insight:Good   Executive Functions  Concentration:Good  Attention Span:Good  Recall:Good  Fund of Knowledge:Good  Language:Good   Psychomotor Activity  Psychomotor Activity:Psychomotor Activity: Normal   Assets  Assets:Desire for Improvement; Manufacturing systems engineer; Financial Resources/Insurance; Resilience; Social Support; Housing; Talents/Skills   Sleep  Sleep:Sleep: Good Number of Hours of Sleep: 6.5   Physical Exam: Physical Exam Vitals and nursing note reviewed.  Constitutional:      Appearance: Normal appearance.  HENT:     Head: Normocephalic and atraumatic.  Pulmonary:     Effort: Pulmonary effort  is normal.  Neurological:     General: No focal deficit present.     Mental Status: She is alert.   Review of Systems  All other systems reviewed and are negative. Blood pressure 114/90, pulse (!) 142, temperature 98 F (36.7 C), temperature source Oral, resp. rate 16, height 5\' 2"  (1.575 m), weight 59.4 kg, SpO2 100 %. Body mass index is 23.96 kg/m.  Mental Status Per Nursing Assessment::   On Admission:  Suicidal ideation indicated by others  Demographic Factors:  NA  Loss Factors: NA  Historical Factors: Impulsivity  Risk Reduction Factors:   Sense of responsibility to family, Living with another person, especially a relative, and Positive social support  Continued Clinical Symptoms:  Depression:   Impulsivity  Cognitive Features That Contribute To Risk:  None    Suicide Risk:  Minimal: No identifiable suicidal ideation.  Patients presenting with no risk factors but with morbid ruminations; may be classified as minimal risk based on the severity of the depressive symptoms   Follow-up Information     [id number], MD Follow up on 07/06/2021.   Specialty: Psychiatry Why: You have a re-assessment appointment for medication management services on 07/06/21 at 11:00 am.  This will be a Virtual appointment.  * PLEASE RETURN THE REASSESSMENT PACKET THEY WILL SEND YOU, PRIOR TO YOUR APPT. Contact information: [address] [place] [phone]         BEHAVIORAL HEALTH OUTPATIENT THERAPY [place] Follow up on 05/31/2021.   Specialty: Behavioral Health Why: You have an appointment for therapy services on 05/31/21 at 11:00 am with  07/31/21.  This will be a  Virtual appointment. Contact information: [address] [place] [phone]        ADS. Go to.   Why: Please go to this provider for substance use intensive outpatient therapy services,  Monday through Friday between 6:00 am and 10:30  am. Contact information: [address] [place], [place] [address]  P: [phone] F: [phone]                Plan Of Care/Follow-up recommendations:  Activity:  ad lib  Antonieta Pert, MD 05/19/2021, 11:28 AM

## 2021-05-19 NOTE — Progress Notes (Signed)
  [facility] Adult Case Management Discharge Plan :  Will you be returning to the same living situation after discharge:  Yes,  with family At discharge, do you have transportation home?: Yes,  has car Do you have the ability to pay for your medications: Yes,  has Medicaid  Release of information consent forms completed and in the chart;  Patient's signature needed at discharge.  Patient to Follow up at:  Follow-up Information     Thedore Mins, MD Follow up on 07/06/2021.   Specialty: Psychiatry Why: You have a re-assessment appointment for medication management services on 07/06/21 at 11:00 am.  This will be a Virtual appointment.  * PLEASE RETURN THE REASSESSMENT PACKET THEY WILL SEND YOU, PRIOR TO YOUR APPT. Contact information: [address] [place] [address] [phone]         BEHAVIORAL HEALTH OUTPATIENT THERAPY [place] Follow up on 05/31/2021.   Specialty: Behavioral Health Why: You have an appointment for therapy services on 05/31/21 at 11:00 am with  Alvera Singh.  This will be a  Virtual appointment. Contact information: [address] [id number] mc [place] [address] [phone]        ADS. Go to.   Why: Please go to this provider for substance use intensive outpatient therapy services, Monday through Friday between 6:00 am and 10:30 am. Contact information: [address] [place], [place] [address]  P: [phone] F: [phone]                Next level of care provider has access to [facility] Link:no  Safety Planning and Suicide Prevention discussed: Yes,  with mother     Has patient been referred to the Quitline?: Patient refused referral  Patient has been referred for addiction treatment: Yes  Lynnell Chad, LCSW 05/19/2021, 1:00 PM

## 2021-05-21 LAB — HEMOGLOBIN A1C
Hgb A1c MFr Bld: 5.2 % (ref 4.8–5.6)
Mean Plasma Glucose: 103 mg/dL

## 2021-05-31 ENCOUNTER — Ambulatory Visit (INDEPENDENT_AMBULATORY_CARE_PROVIDER_SITE_OTHER): Payer: Medicaid Other | Admitting: Licensed Clinical Social Worker

## 2021-05-31 ENCOUNTER — Other Ambulatory Visit: Payer: Self-pay

## 2021-05-31 ENCOUNTER — Encounter (HOSPITAL_COMMUNITY): Payer: Self-pay | Admitting: Licensed Clinical Social Worker

## 2021-05-31 DIAGNOSIS — F331 Major depressive disorder, recurrent, moderate: Secondary | ICD-10-CM | POA: Diagnosis not present

## 2021-05-31 NOTE — Progress Notes (Signed)
Virtual Visit via Video Note  I connected with Phylliss Bob on 05/31/21 at 11:00 AM EDT by a video enabled telemedicine application and verified that I am speaking with the correct person using two identifiers.  Location: Patient: home Provider: home office   I discussed the limitations of evaluation and management by telemedicine and the availability of in person appointments. The patient expressed understanding and agreed to proceed.   Type of Therapy: Individual Therapy   Treatment Goals addressed: "improve my focus in school and decrease depression". Sobia will improve school motivation and performance, as well as experience fewer depressive sxs 5 out of 7 days, as evidenced by improvement in participation in school, better grades, and self report of increased motivation, more routine sleep, hygiene, and happier mood/affect.   Interventions: Motivational Interviewing, CBT psychoeducation   Summary: AZARYAH HEATHCOCK is an 20 y.o. female who presents with MDD, recurrent, moderate, Anxiety Disorder, unspecified type, and ADHD inattentive            Suicidal/Homicidal: Nowithout intent/plan   Therapist Response: Athalee met with clinician for an individual session. Parker shared that in the past 6 months, she has graduated from high school, decided to work for a year before going back to college, and changed her plans completely. Clinician utilized CBT to process updates, emotional health, and recent hospitalization. Clinician reflected the improvement in attitude and mood overall since hospitalization. Eh shared that she feels good about her new medications and is hopeful that they will work. Jameson also discussed her own research about Bipolar Depression and will request evaluation from Dr. Darleene Cleaver. Clinician discussed possibility due to hx of cycling depressive episodes over the years. Clinician discussed relationship with boyfriend of 7 months. Mariaclara identified that she is  struggling with some jealousy and resentment about an incident that occurred several months ago. Clinician offered support and identified the importance of being open minded and cordial to the friend, as this may be a reason why her relationship may not work. Clinician reviewed coping skills.    Plan: Return again in 2-4 weeks.    Diagnosis:      Axis I: ADHD, inattentive type, Anxiety Disorder NOS and Depressive Disorder NOS   I discussed the assessment and treatment plan with the patient. The patient was provided an opportunity to ask questions and all were answered. The patient agreed with the plan and demonstrated an understanding of the instructions.   The patient was advised to call back or seek an in-person evaluation if the symptoms worsen or if the condition fails to improve as anticipated.  I provided 55 minutes of non-face-to-face time during this encounter.   [name], LCSW

## 2021-06-10 ENCOUNTER — Encounter: Payer: Self-pay | Admitting: Pediatrics

## 2021-06-12 ENCOUNTER — Other Ambulatory Visit: Payer: Self-pay

## 2021-06-12 ENCOUNTER — Ambulatory Visit (INDEPENDENT_AMBULATORY_CARE_PROVIDER_SITE_OTHER): Payer: Medicaid Other | Admitting: Licensed Clinical Social Worker

## 2021-06-12 DIAGNOSIS — F331 Major depressive disorder, recurrent, moderate: Secondary | ICD-10-CM | POA: Diagnosis not present

## 2021-06-12 DIAGNOSIS — F419 Anxiety disorder, unspecified: Secondary | ICD-10-CM

## 2021-06-12 DIAGNOSIS — F900 Attention-deficit hyperactivity disorder, predominantly inattentive type: Secondary | ICD-10-CM

## 2021-06-12 NOTE — Progress Notes (Signed)
Virtual Visit via Video Note  I connected with Taylor Jefferson on 06/12/21 at 12:30 PM EDT by a video enabled telemedicine application and verified that I am speaking with the correct person using two identifiers.  Location: Patient: home Provider: office   I discussed the limitations of evaluation and management by telemedicine and the availability of in person appointments. The patient expressed understanding and agreed to proceed.   Type of Therapy: Individual Therapy   Treatment Goals addressed: "improve my focus in school and decrease depression". Taylor Jefferson will improve school motivation and performance, as well as experience fewer depressive sxs 5 out of 7 days, as evidenced by improvement in participation in school, better grades, and self report of increased motivation, more routine sleep, hygiene, and happier mood/affect.   Interventions: Motivational Interviewing   Summary: Taylor Jefferson is an 20 y.o. female who presents with MDD, recurrent, moderate, Anxiety Disorder, unspecified type, and ADHD inattentive            Suicidal/Homicidal: Nowithout intent/plan   Therapist Response: Taylor Jefferson met with clinician for an individual session. Taylor Jefferson shared that a lot has happened this week, including breaking up with her boyfriend. Clinician explored what happened using MI OARS. Taylor Jefferson shared many details and noted many times when she put herself on the back burner and put her boyfriend first. Clinician processed Taylor Jefferson's miscarriage that she experienced a few months ago and provided information for [employer] as a support.    Diagnosis:      Axis I: ADHD, inattentive type, Anxiety Disorder NOS and Depressive Disorder NOS     I discussed the assessment and treatment plan with the patient. The patient was provided an opportunity to ask questions and all were answered. The patient agreed with the plan and demonstrated an understanding of the instructions.   The patient was advised to  call back or seek an in-person evaluation if the symptoms worsen or if the condition fails to improve as anticipated.  I provided 45 minutes of non-face-to-face time during this encounter.   [name], LCSW

## 2021-06-13 ENCOUNTER — Encounter (HOSPITAL_COMMUNITY): Payer: Self-pay | Admitting: Licensed Clinical Social Worker

## 2021-06-18 DIAGNOSIS — Z0000 Encounter for general adult medical examination without abnormal findings: Secondary | ICD-10-CM | POA: Diagnosis not present

## 2021-06-21 ENCOUNTER — Ambulatory Visit (INDEPENDENT_AMBULATORY_CARE_PROVIDER_SITE_OTHER): Payer: Medicaid Other | Admitting: Licensed Clinical Social Worker

## 2021-06-21 ENCOUNTER — Other Ambulatory Visit: Payer: Self-pay

## 2021-06-21 DIAGNOSIS — F900 Attention-deficit hyperactivity disorder, predominantly inattentive type: Secondary | ICD-10-CM

## 2021-06-21 DIAGNOSIS — F331 Major depressive disorder, recurrent, moderate: Secondary | ICD-10-CM | POA: Diagnosis not present

## 2021-06-21 DIAGNOSIS — F419 Anxiety disorder, unspecified: Secondary | ICD-10-CM

## 2021-06-26 ENCOUNTER — Encounter (HOSPITAL_COMMUNITY): Payer: Self-pay | Admitting: Licensed Clinical Social Worker

## 2021-06-26 NOTE — Progress Notes (Signed)
Virtual Visit via Video Note  I connected with Taylor Jefferson on 06/26/21 at 12:30 PM EDT by a video enabled telemedicine application and verified that I am speaking with the correct person using two identifiers.  Location: Patient: home Provider: home office   I discussed the limitations of evaluation and management by telemedicine and the availability of in person appointments. The patient expressed understanding and agreed to proceed.   Type of Therapy: Individual Therapy   Treatment Goals addressed: "improve my focus in school and decrease depression". Taylor Jefferson will improve school motivation and performance, as well as experience fewer depressive sxs 5 out of 7 days, as evidenced by improvement in participation in school, better grades, and self report of increased motivation, more routine sleep, hygiene, and happier mood/affect.   Interventions: Motivational Interviewing   Summary: Taylor Jefferson is an 20 y.o. female who presents with MDD, recurrent, moderate, Anxiety Disorder, unspecified type, and ADHD inattentive            Suicidal/Homicidal: Nowithout intent/plan   Therapist Response: Taylor Jefferson met with clinician for an individual session. Taylor Jefferson shared that she is having a better week. Clinician utilized MI OARS to reflect and summarize updates in mood and activities. Clinician discussed plans for the future and noted increase in options for getting a job. Clinician discussed updates with ex-boyfriend and noted that she is still sad, but feels good that she can move forward with her life. Clinician processed coping skills and noted that grief is a natural process that allows for processing and moving forward. Taylor Jefferson shared that she continues to mourn the loss of her dad a few years ago and that even when she was doing counseling at that time, she never really let the grief happen. Clinician discussed barriers to allowing the work to happen and processed whether those barriers are  still present. Clinician encouraged Taylor Jefferson to think logically about the process of grief and to allow her emotions to be present as they come up.    Diagnosis:      Axis I: ADHD, inattentive type, Anxiety Disorder NOS and Depressive Disorder NOS      I discussed the assessment and treatment plan with the patient. The patient was provided an opportunity to ask questions and all were answered. The patient agreed with the plan and demonstrated an understanding of the instructions.   The patient was advised to call back or seek an in-person evaluation if the symptoms worsen or if the condition fails to improve as anticipated.  I provided 45 minutes of non-face-to-face time during this encounter.   [name], LCSW

## 2021-07-04 ENCOUNTER — Ambulatory Visit (HOSPITAL_COMMUNITY): Payer: Medicaid Other | Admitting: Licensed Clinical Social Worker

## 2021-07-04 ENCOUNTER — Other Ambulatory Visit: Payer: Self-pay

## 2021-07-12 ENCOUNTER — Ambulatory Visit: Payer: Self-pay

## 2021-07-31 ENCOUNTER — Other Ambulatory Visit: Payer: Self-pay

## 2021-07-31 ENCOUNTER — Encounter: Payer: Self-pay | Admitting: Adult Health

## 2021-07-31 ENCOUNTER — Ambulatory Visit (INDEPENDENT_AMBULATORY_CARE_PROVIDER_SITE_OTHER): Payer: Medicaid Other | Admitting: Adult Health

## 2021-07-31 VITALS — BP 127/75 | HR 92 | Ht 63.0 in | Wt 124.0 lb

## 2021-07-31 DIAGNOSIS — Z30013 Encounter for initial prescription of injectable contraceptive: Secondary | ICD-10-CM | POA: Diagnosis not present

## 2021-07-31 DIAGNOSIS — Z113 Encounter for screening for infections with a predominantly sexual mode of transmission: Secondary | ICD-10-CM | POA: Diagnosis not present

## 2021-07-31 DIAGNOSIS — Z3046 Encounter for surveillance of implantable subdermal contraceptive: Secondary | ICD-10-CM

## 2021-07-31 DIAGNOSIS — Z3202 Encounter for pregnancy test, result negative: Secondary | ICD-10-CM

## 2021-07-31 DIAGNOSIS — Z3009 Encounter for other general counseling and advice on contraception: Secondary | ICD-10-CM

## 2021-07-31 LAB — POCT URINE PREGNANCY: Preg Test, Ur: NEGATIVE

## 2021-07-31 MED ORDER — MEDROXYPROGESTERONE ACETATE 150 MG/ML IM SUSP
150.0000 mg | Freq: Once | INTRAMUSCULAR | Status: AC
  Administered 2021-07-31: 150 mg via INTRAMUSCULAR

## 2021-07-31 MED ORDER — MEDROXYPROGESTERONE ACETATE 150 MG/ML IM SUSP
150.0000 mg | INTRAMUSCULAR | 4 refills | Status: DC
Start: 2021-07-31 — End: 2022-05-03

## 2021-07-31 NOTE — Patient Instructions (Signed)
Use condoms x 2 weeks, keep clean and dry x 24 hours, no heavy lifting, keep steri strips on x 72 hours, Keep pressure dressing on x 24 hours. Follow up prn problems.  

## 2021-07-31 NOTE — Progress Notes (Signed)
  Subjective:     Patient ID: Taylor Jefferson, female   DOB: 2001/04/23, 20 y.o.   MRN: [id number]  HPI Alizabeth is a 20 year old black female,single, G0P0, in to get nexplanon removed, has had bleeding, and wants to start depo. PCP is Dr Carroll Kinds  Review of Systems Had bleeding with nexplanon, wants it removed and to start depo Reviewed past medical,surgical, social and family history. Reviewed medications and allergies.     Objective:   Physical Exam BP 127/75 (BP Location: Right Arm, Patient Position: Sitting, Cuff Size: Normal)   Pulse 92   Ht 5\' 3"  (1.6 m)   Wt 124 lb (56.2 kg)   LMP  (LMP Unknown)   BMI 21.97 kg/m     UPT is negative.Consent signed and time out called. Left arm cleansed with alcohol, and injected with 1.5 cc 2% lidocaine and waited til numb.Under sterile technique a #11 blade was used to make small vertical incision, and a curved forceps was used to easily remove rod. Steri strips applied. Pressure dressing applied.   [facility] - 07/31/21 1353       Pregnancy Intention Screening   Does the patient want to become pregnant in the next year? No    Does the patient's partner want to become pregnant in the next year? No    Would the patient like to discuss contraceptive options today? Yes      Contraception Wrap Up   Current Method Hormonal Implant    End Method Hormonal Injection    Contraception Counseling Provided Yes             Assessment:    1. Encounter for counseling regarding contraception  2. Screening for STDs (sexually transmitted diseases) Urine sent for GC/CHL  3. Encounter for Nexplanon removal   4. Encounter for initial prescription of injectable contraceptive Will give first depo today in office, and send in Rx for depo Meds ordered this encounter  Medications   medroxyPROGESTERone (DEPO-PROVERA) 150 MG/ML injection    Sig: Inject 1 mL (150 mg total) into the muscle every 3 (three) months.    Dispense:  1 mL    Refill:  4     Order Specific Question:   Supervising Provider    Answer:   09/30/21 H [[id number]]   medroxyPROGESTERone (DEPO-PROVERA) injection 150 mg        Plan:     Use condoms x 2 weeks, keep clean and dry x 24 hours, no heavy lifting, keep steri strips on x 72 hours, Keep pressure dressing on x 24 hours. Follow up prn problems.    Return in 12 weeks for depo.

## 2021-08-01 ENCOUNTER — Ambulatory Visit: Payer: Self-pay

## 2021-08-01 ENCOUNTER — Other Ambulatory Visit: Payer: Self-pay

## 2021-08-01 ENCOUNTER — Ambulatory Visit
Admission: EM | Admit: 2021-08-01 | Discharge: 2021-08-01 | Disposition: A | Discharge status: Home / Self Care | Payer: Medicaid Other

## 2021-08-02 ENCOUNTER — Ambulatory Visit: Payer: Self-pay

## 2021-08-04 LAB — GC/CHLAMYDIA PROBE AMP
Chlamydia trachomatis, NAA: NEGATIVE
Neisseria Gonorrhoeae by PCR: NEGATIVE

## 2021-08-10 ENCOUNTER — Other Ambulatory Visit: Payer: Self-pay | Admitting: Adult Health

## 2021-08-10 ENCOUNTER — Ambulatory Visit
Admission: EM | Admit: 2021-08-10 | Discharge: 2021-08-10 | Disposition: A | Discharge status: Home / Self Care | Payer: Medicaid Other | Attending: Family Medicine | Admitting: Family Medicine

## 2021-08-10 ENCOUNTER — Encounter: Payer: Self-pay | Admitting: Emergency Medicine

## 2021-08-10 ENCOUNTER — Other Ambulatory Visit: Payer: Self-pay

## 2021-08-10 DIAGNOSIS — J4541 Moderate persistent asthma with (acute) exacerbation: Secondary | ICD-10-CM

## 2021-08-10 MED ORDER — DEXAMETHASONE SODIUM PHOSPHATE 10 MG/ML IJ SOLN
10.0000 mg | Freq: Once | INTRAMUSCULAR | Status: AC
  Administered 2021-08-10: 10 mg via INTRAMUSCULAR

## 2021-08-10 MED ORDER — BUDESONIDE-FORMOTEROL FUMARATE 160-4.5 MCG/ACT IN AERO: 2.0000 | INHALATION_SPRAY | Freq: Two times a day (BID) | RESPIRATORY_TRACT | 2 refills | Status: DC

## 2021-08-10 MED ORDER — ALBUTEROL SULFATE HFA 108 (90 BASE) MCG/ACT IN AERS
2.0000 | INHALATION_SPRAY | Freq: Once | RESPIRATORY_TRACT | Status: AC
  Administered 2021-08-10: 2 via RESPIRATORY_TRACT

## 2021-08-10 NOTE — ED Triage Notes (Signed)
Cough x 1 week.  States she has been having several asthma attacks.

## 2021-08-10 NOTE — ED Provider Notes (Signed)
[facility]    CSN: [id number] Arrival date & time: 08/10/21  2297      History   Chief Complaint No chief complaint on file.   HPI Taylor Jefferson is a 20 y.o. female.   Patient presenting today with worsening cough, wheezing, chest tightness, shortness of breath for the past week.  Denies nasal congestion, sore throat, body aches, chills, fever, abdominal pain, nausea vomiting or diarrhea.  States that she has been having frequent asthma attacks the past few weeks and her albuterol inhaler is not controlling things well.  Called EMS yesterday for severe asthma attack but did not go to the emergency department.  Called her PCP yesterday after this event and had a Solu-Medrol Dosepak called in which she started last night.  Has been using albuterol inhaler and nebulizer at home, states she used to be on Flovent but has been out for several months.  No known sick contacts recently.   Past Medical History:  Diagnosis Date   ADD (attention deficit disorder)    Allergy    Amenorrhea 11/02/2014   Anxiety    Asthma    Chlamydia 09/27/2019   tx 12/7, POC________   Contraceptive education 04/04/2014   Contraceptive management 04-19-14   Death of parent August 11, 2020   Depression    HA (headache)    History of chlamydia 10/25/2019   Irregular periods 03/17/2019   Patellar pain    Dr. 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   Precocious puberty 10/08/2011   Reflux    Reflux    Seasonal allergies    Suicidal thoughts 01/06/2018   Vaginal burning 11/02/2014   Vaginal discharge 11/02/2014   Vaginal ulceration 04/04/2014   Will culture for HSV GC/CHL and a wound culture was obtained    Patient Active Problem List   Diagnosis Date Noted   Marijuana use 05/17/2021   Recurrent major depressive disorder (HCC) 05/17/2021   Unspecified mood (affective) disorder (HCC) 05/17/2021   Anxiety disorder, unspecified 05/17/2021   Intentional overdose of drug in  tablet form (HCC) 05/16/2021   Pregnancy examination or test, negative result 01/02/2021   Nexplanon in place 01/02/2021   Irregular intermenstrual bleeding 01/02/2021   Non-seasonal allergic rhinitis due to pollen 08/29/2020   Mild persistent asthma without complication 02/29/2020   Flat feet, bilateral 10/09/2018   MDD (major depressive disorder), recurrent severe, 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   Migraine without aura and without status migrainosus, not intractable 12/24/2016   Anxiety state 01/04/2015   Tension headache 01/04/2015   Contraceptive management 04/19/2014   Vaginal ulceration 04/04/2014    History reviewed. No pertinent surgical history.  OB History     Gravida  0   Para      Term      Preterm      AB      Living         SAB      IAB      Ectopic      Multiple      Live Births               Home Medications    Prior to Admission medications   Medication Sig Start Date End Date Taking? Authorizing Provider  budesonide-formoterol (SYMBICORT) 160-4.5 MCG/ACT inhaler Inhale 2 puffs into the lungs 2 (two) times daily. 08/10/21  Yes Particia Nearing, PA-C  albuterol (PROVENTIL) (2.5 MG/3ML) 0.083% nebulizer solution Take 3 mLs (2.5 mg total) by  nebulization every 4 (four) hours as needed for wheezing or shortness of breath. 02/13/21   Vella Kohler, MD  albuterol (VENTOLIN HFA) 108 (90 Base) MCG/ACT inhaler Inhale 2 puffs into the lungs every 4 (four) hours as needed (Cough). USE WITH SPACER 02/13/21   Vella Kohler, MD  buPROPion (WELLBUTRIN XL) 150 MG 24 hr tablet Take 1 tablet (150 mg total) by mouth daily. For depression 05/20/21   Armandina Stammer I, NP  fluticasone (FLOVENT HFA) 44 MCG/ACT inhaler Inhale 2 puffs into the lungs in the morning and at bedtime. Patient not taking: No sig reported 02/13/21 05/16/21  Vella Kohler, MD  hydrOXYzine (ATARAX/VISTARIL) 25 MG tablet Take 1 tablet (25 mg total) by mouth 3 (three) times daily  as needed for anxiety. 05/19/21   Armandina Stammer I, NP  medroxyPROGESTERone (DEPO-PROVERA) 150 MG/ML injection Inject 1 mL (150 mg total) into the muscle every 3 (three) months. 07/31/21   Adline Potter, NP  montelukast (SINGULAIR) 10 MG tablet Take 1 tablet (10 mg total) by mouth at bedtime. 05/19/21   Armandina Stammer I, NP  OXcarbazepine (TRILEPTAL) 150 MG tablet Take 1 tablet (150 mg total) by mouth 2 (two) times daily. For mood stabilization 05/19/21   Armandina Stammer I, NP  traZODone (DESYREL) 50 MG tablet Take 1 tablet (50 mg total) by mouth at bedtime as needed for sleep. 05/19/21   Sanjuana Kava, NP    Family History Family History  Problem Relation Age of Onset   Depression Mother    HIV Mother    Other Mother        spinal stenosis   Glaucoma Mother    Migraines Mother    Heart disease Father    Hypertension Maternal Grandmother    Arthritis Maternal Grandmother        rheumatoid   Other Maternal Grandmother        acid reflux   Migraines Maternal Grandmother    Depression Maternal Grandmother    Hyperlipidemia Maternal Grandfather    ADD / ADHD Sister    ADD / ADHD Brother     Social History Social History   Tobacco Use   Smoking status: Former   Smokeless tobacco: Never  Building services engineer Use: Every day   Substances: Nicotine  Substance Use Topics   Alcohol use: No   Drug use: Not Currently    Types: Marijuana     Allergies   Other   Review of Systems Review of Systems Per HPI  Physical Exam Triage Vital Signs ED Triage Vitals  Enc Vitals Group     BP 08/10/21 0931 113/77     Pulse Rate 08/10/21 0931 91     Resp 08/10/21 0931 18     Temp 08/10/21 0931 97.8 F (36.6 C)     Temp Source 08/10/21 0931 Temporal     SpO2 08/10/21 0931 97 %     Weight --      Height --      Head Circumference --      Peak Flow --      Pain Score 08/10/21 0932 10     Pain Loc --      Pain Edu? --      Excl. in GC? --    No data found.  Updated Vital  Signs BP 113/77 (BP Location: Right Arm)   Pulse 91   Temp 97.8 F (36.6 C) (Temporal)   Resp 18   LMP  (  LMP Unknown)   SpO2 97%   Visual Acuity Right Eye Distance:   Left Eye Distance:   Bilateral Distance:    Right Eye Near:   Left Eye Near:    Bilateral Near:     Physical Exam Vitals and nursing note reviewed.  Constitutional:      Appearance: Normal appearance. She is not ill-appearing.  HENT:     Head: Atraumatic.     Nose: Nose normal.     Mouth/Throat:     Mouth: Mucous membranes are moist.     Pharynx: Oropharynx is clear.  Eyes:     Extraocular Movements: Extraocular movements intact.     Conjunctiva/sclera: Conjunctivae normal.  Cardiovascular:     Rate and Rhythm: Normal rate and regular rhythm.     Heart sounds: Normal heart sounds.  Pulmonary:     Comments: Significant wheezes audible, unable to speak full sentences and persistently coughing upon initiating visit.  Significant benefit after IM Decadron, 4 puffs of albuterol administered in clinic.  Able to speak full sentences, minimal wheezing with good air movement bilaterally on auscultation.  No rales. Musculoskeletal:        General: Normal range of motion.     Cervical back: Normal range of motion and neck supple.  Skin:    General: Skin is warm and dry.  Neurological:     Mental Status: She is alert and oriented to person, place, and time.  Psychiatric:        Mood and Affect: Mood normal.        Thought Content: Thought content normal.        Judgment: Judgment normal.     UC Treatments / Results  Labs (all labs ordered are listed, but only abnormal results are displayed) Labs Reviewed - No data to display  EKG   Radiology No results found.  Procedures Procedures (including critical care time)  Medications Ordered in UC Medications  albuterol (VENTOLIN HFA) 108 (90 Base) MCG/ACT inhaler 2 puff (2 puffs Inhalation Given 08/10/21 0944)  dexamethasone (DECADRON) injection 10 mg (10  mg Intramuscular Given 08/10/21 0944)    Initial Impression / Assessment and Plan / UC Course  I have reviewed the triage vital signs and the nursing notes.  Pertinent labs & imaging results that were available during my care of the patient were reviewed by me and considered in my medical decision making (see chart for details).     Vital signs reassuring, significant provement in symptoms with IM Decadron and puffs of albuterol inhaler with spacer.  We will restart maintenance inhaler with Symbicort twice daily, continue albuterol inhaler and neb treatments as needed, restart allergy regimen for better coverage.  Return for acutely worsening symptoms.  Final Clinical Impressions(s) / UC Diagnoses   Final diagnoses:  Moderate persistent asthma with acute exacerbation   Discharge Instructions   None    ED Prescriptions     Medication Sig Dispense Auth. Provider   budesonide-formoterol (SYMBICORT) 160-4.5 MCG/ACT inhaler Inhale 2 puffs into the lungs 2 (two) times daily. 1 each Particia Nearing, PA-C      PDMP not reviewed this encounter.   Particia Nearing, [place] 08/10/21 1035

## 2021-10-23 ENCOUNTER — Other Ambulatory Visit: Payer: Self-pay

## 2021-10-23 ENCOUNTER — Ambulatory Visit (INDEPENDENT_AMBULATORY_CARE_PROVIDER_SITE_OTHER): Payer: Medicaid Other | Admitting: *Deleted

## 2021-10-23 DIAGNOSIS — Z3042 Encounter for surveillance of injectable contraceptive: Secondary | ICD-10-CM | POA: Diagnosis not present

## 2021-10-23 MED ORDER — MEDROXYPROGESTERONE ACETATE 150 MG/ML IM SUSP
150.0000 mg | Freq: Once | INTRAMUSCULAR | Status: AC
  Administered 2021-10-23: 150 mg via INTRAMUSCULAR

## 2021-10-23 NOTE — Progress Notes (Signed)
° °  NURSE VISIT- INJECTION  SUBJECTIVE:  Taylor Jefferson is a 21 y.o. G0P0 female here for a Depo Provera for contraception/period management. She is a GYN patient.   OBJECTIVE:  There were no vitals taken for this visit.  Appears well, in no apparent distress  Injection administered in: Left deltoid  Meds ordered this encounter  Medications   medroxyPROGESTERone (DEPO-PROVERA) injection 150 mg    ASSESSMENT: GYN patient Depo Provera for contraception/period management PLAN: Follow-up: in 11-13 weeks for next Depo   Malachy Mood  10/23/2021 2:56 PM

## 2021-11-01 ENCOUNTER — Other Ambulatory Visit (INDEPENDENT_AMBULATORY_CARE_PROVIDER_SITE_OTHER): Payer: Medicaid Other | Admitting: *Deleted

## 2021-11-01 ENCOUNTER — Other Ambulatory Visit: Payer: Self-pay

## 2021-11-01 ENCOUNTER — Other Ambulatory Visit (HOSPITAL_COMMUNITY)
Admission: RE | Admit: 2021-11-01 | Discharge: 2021-11-01 | Disposition: A | Discharge status: Home / Self Care | Payer: Medicaid Other | Source: Ambulatory Visit | Attending: Obstetrics & Gynecology | Admitting: Obstetrics & Gynecology

## 2021-11-01 DIAGNOSIS — Z113 Encounter for screening for infections with a predominantly sexual mode of transmission: Secondary | ICD-10-CM | POA: Insufficient documentation

## 2021-11-01 NOTE — Progress Notes (Addendum)
° °  NURSE VISIT- VAGINITIS/STD/POC  SUBJECTIVE:  Taylor Jefferson is a 21 y.o. G0P0 GYN patientfemale here for a vaginal swab for STD screen.  She reports the following symptoms: none for 0 days. Denies abnormal vaginal bleeding, significant pelvic pain, fever, or UTI symptoms.  OBJECTIVE:  There were no vitals taken for this visit.  Appears well, in no apparent distress  ASSESSMENT: Vaginal swab for STD screen  PLAN: Self-collected vaginal probe for Gonorrhea, Chlamydia, Trichomonas, Bacterial Vaginosis, Yeast sent to lab Treatment: to be determined once results are received Follow-up as needed if symptoms persist/worsen, or new symptoms develop  Annamarie Dawley  11/01/2021 10:02 AM  Chart reviewed for nurse visit. Agree with plan of care.  Adline Potter, NP 11/01/2021 10:47 AM

## 2021-11-02 ENCOUNTER — Other Ambulatory Visit: Payer: Self-pay | Admitting: Adult Health

## 2021-11-02 LAB — CERVICOVAGINAL ANCILLARY ONLY
Bacterial Vaginitis (gardnerella): POSITIVE — AB
Candida Glabrata: NEGATIVE
Candida Vaginitis: POSITIVE — AB
Chlamydia: NEGATIVE
Comment: NEGATIVE
Comment: NEGATIVE
Comment: NEGATIVE
Comment: NEGATIVE
Comment: NEGATIVE
Comment: NORMAL
Neisseria Gonorrhea: NEGATIVE
Trichomonas: NEGATIVE

## 2021-11-02 MED ORDER — METRONIDAZOLE 500 MG PO TABS: 500.0000 mg | ORAL_TABLET | Freq: Two times a day (BID) | ORAL | 0 refills | Status: DC

## 2021-11-02 MED ORDER — FLUCONAZOLE 150 MG PO TABS: ORAL_TABLET | ORAL | 1 refills | Status: DC

## 2021-11-02 NOTE — Progress Notes (Signed)
+  BV and yeast on vaginal swab will rx flagyl and diflucan 

## 2022-01-16 ENCOUNTER — Ambulatory Visit: Payer: Medicaid Other

## 2022-03-03 ENCOUNTER — Encounter (HOSPITAL_COMMUNITY): Payer: Self-pay | Admitting: *Deleted

## 2022-03-03 ENCOUNTER — Other Ambulatory Visit: Payer: Self-pay

## 2022-03-03 ENCOUNTER — Emergency Department (HOSPITAL_COMMUNITY)
Admission: EM | Admit: 2022-03-03 | Discharge: 2022-03-03 | Discharge status: Against Medical Advice | Payer: Medicaid Other | Attending: Emergency Medicine | Admitting: Emergency Medicine

## 2022-03-03 DIAGNOSIS — B009 Herpesviral infection, unspecified: Secondary | ICD-10-CM | POA: Insufficient documentation

## 2022-03-03 DIAGNOSIS — Z5321 Procedure and treatment not carried out due to patient leaving prior to being seen by health care provider: Secondary | ICD-10-CM | POA: Insufficient documentation

## 2022-03-03 NOTE — ED Triage Notes (Signed)
Pt with herpes outbreak 2-3 days. Currently not on any medication for it.  ?

## 2022-03-03 NOTE — ED Notes (Signed)
Registration reports that pt left at 2108 ?

## 2022-03-04 ENCOUNTER — Telehealth: Payer: Self-pay

## 2022-03-04 NOTE — Telephone Encounter (Signed)
Transition Care Management Unsuccessful Follow-up Telephone Call ? ?Date of discharge and from where:  [facility] on 03/03/22 ? ?Attempts:  1st Attempt ? ?Reason for unsuccessful TCM follow-up call:  Left voice message ? ?  ?

## 2022-03-05 NOTE — Telephone Encounter (Signed)
Transition Care Management Unsuccessful Follow-up Telephone Call ? ?Date of discharge and from where:  [facility] on 03/03/2022 ? ?Attempts:  2nd Attempt ? ?Reason for unsuccessful TCM follow-up call:  Left voice message ? ?  ?

## 2022-03-05 NOTE — Telephone Encounter (Signed)
Transition Care Management Unsuccessful Follow-up Telephone Call ? ?Date of discharge and from where:  [facility] on 03/03/2022 ? ?Attempts:  3rd Attempt ? ?Reason for unsuccessful TCM follow-up call:  Left voice message ? ?  ?

## 2022-03-14 ENCOUNTER — Encounter (HOSPITAL_COMMUNITY): Payer: Self-pay | Admitting: Emergency Medicine

## 2022-03-14 ENCOUNTER — Emergency Department (HOSPITAL_COMMUNITY)
Admission: EM | Admit: 2022-03-14 | Discharge: 2022-03-14 | Disposition: A | Discharge status: Home / Self Care | Payer: Medicaid Other | Attending: Emergency Medicine | Admitting: Emergency Medicine

## 2022-03-14 DIAGNOSIS — Z7951 Long term (current) use of inhaled steroids: Secondary | ICD-10-CM | POA: Diagnosis not present

## 2022-03-14 DIAGNOSIS — J4521 Mild intermittent asthma with (acute) exacerbation: Secondary | ICD-10-CM | POA: Insufficient documentation

## 2022-03-14 DIAGNOSIS — R0602 Shortness of breath: Secondary | ICD-10-CM | POA: Diagnosis present

## 2022-03-14 MED ORDER — PREDNISONE 50 MG PO TABS
60.0000 mg | ORAL_TABLET | Freq: Once | ORAL | Status: AC
  Administered 2022-03-14: 60 mg via ORAL
  Filled 2022-03-14: qty 1

## 2022-03-14 MED ORDER — PREDNISONE 50 MG PO TABS: 50.0000 mg | ORAL_TABLET | Freq: Every day | ORAL | 0 refills | Status: DC

## 2022-03-14 MED ORDER — IPRATROPIUM-ALBUTEROL 0.5-2.5 (3) MG/3ML IN SOLN
3.0000 mL | Freq: Once | RESPIRATORY_TRACT | Status: AC
  Administered 2022-03-14: 3 mL via RESPIRATORY_TRACT
  Filled 2022-03-14: qty 3

## 2022-03-14 NOTE — ED Triage Notes (Signed)
Pt c/o that her asthma has been acting up for the past 2 days. Pt has used her inhaler and neb treatment with no improvement.

## 2022-03-14 NOTE — ED Provider Notes (Signed)
[facility] Provider Note   CSN: [id number] Arrival date & time: 03/14/22  0357     History  Chief Complaint  Patient presents with   Asthma    Taylor Jefferson is a 21 y.o. female.  The history is provided by the patient.  Asthma She has history of asthma and comes in with 2-day history of increased shortness of breath and cough which is productive of a small amount of clear sputum.  She denies fever, chills, sweats.  She has been using her inhaler, but it is only giving her slight, temporary relief.  She denies any sick contacts.  She does admit that she is still smoking.   Home Medications Prior to Admission medications   Medication Sig Start Date End Date Taking? Authorizing Provider  albuterol (PROVENTIL) (2.5 MG/3ML) 0.083% nebulizer solution Take 3 mLs (2.5 mg total) by nebulization every 4 (four) hours as needed for wheezing or shortness of breath. 02/13/21   Vella KohlerQayumi, Zainab S, MD  albuterol (VENTOLIN HFA) 108 (90 Base) MCG/ACT inhaler Inhale 2 puffs into the lungs every 4 (four) hours as needed (Cough). USE WITH SPACER 02/13/21   Vella KohlerQayumi, Zainab S, MD  fluconazole (DIFLUCAN) 150 MG tablet Take 1 now and 1 in 3 days 11/02/21   Adline PotterGriffin, Jennifer A, NP  medroxyPROGESTERone (DEPO-PROVERA) 150 MG/ML injection Inject 1 mL (150 mg total) into the muscle every 3 (three) months. 07/31/21   Adline PotterGriffin, Jennifer A, NP  metroNIDAZOLE (FLAGYL) 500 MG tablet Take 1 tablet (500 mg total) by mouth 2 (two) times daily. 11/02/21   Adline PotterGriffin, Jennifer A, NP  montelukast (SINGULAIR) 10 MG tablet Take 1 tablet (10 mg total) by mouth at bedtime. 05/19/21   Armandina StammerNwoko, Agnes I, NP  OXcarbazepine (TRILEPTAL) 150 MG tablet Take 1 tablet (150 mg total) by mouth 2 (two) times daily. For mood stabilization 05/19/21   Armandina StammerNwoko, Agnes I, NP  traZODone (DESYREL) 50 MG tablet Take 1 tablet (50 mg total) by mouth at bedtime as needed for sleep. 05/19/21   Sanjuana KavaNwoko, Agnes I, NP      Allergies    Other     Review of Systems   Review of Systems  All other systems reviewed and are negative.  Physical Exam Updated Vital Signs BP (!) 127/96 (BP Location: Left Arm)   Pulse 98   Temp 98 F (36.7 C) (Oral)   Resp 18   Ht 5\' 2"  (1.575 m)   Wt 53.1 kg   LMP 02/01/2022 Comment: OB confirmed at the office  SpO2 100%   BMI 21.40 kg/m  Physical Exam Vitals and nursing note reviewed.  21 year old female, resting comfortably and in no acute distress. Vital signs are significant for mildly elevated diastolic blood pressure. Oxygen saturation is 100%, which is normal. Head is normocephalic and atraumatic. PERRLA, EOMI. Oropharynx is clear. Neck is nontender and supple without adenopathy or JVD. Back is nontender and there is no CVA tenderness. Lungs are clear without rales, wheezes, or rhonchi.  There is a slightly prolonged exhalation phase. Chest is nontender. Heart has regular rate and rhythm without murmur. Abdomen is soft, flat, nontender. Extremities have no cyanosis or edema, full range of motion is present. Skin is warm and dry without rash. Neurologic: Mental status is normal, cranial nerves are intact, moves all extremities equally.  ED Results / Procedures / Treatments    Procedures Procedures    Medications Ordered in ED Medications  ipratropium-albuterol (DUONEB) 0.5-2.5 (3) MG/3ML nebulizer solution  3 mL (has no administration in time range)  predniSONE (DELTASONE) tablet 60 mg (has no administration in time range)    ED Course/ Medical Decision Making/ A&P                           Medical Decision Making Risk Prescription drug management.   Exacerbation of asthma.  She is given a dose of prednisone and an nebulizer treatment with albuterol and ipratropium.  Old records are reviewed, and she does have prior ED visits for asthma, but the most recent one was 10/07/2016.  She will be discharged with prescription for 5-day course of prednisone.  Final Clinical  Impression(s) / ED Diagnoses Final diagnoses:  Mild intermittent asthma with exacerbation    Rx / DC Orders ED Discharge Orders          Ordered    predniSONE (DELTASONE) 50 MG tablet  Daily        03/14/22 0443              Dione Booze, MD 03/14/22 [phone]

## 2022-03-14 NOTE — ED Notes (Signed)
99% after ambulating to tx room

## 2022-03-14 NOTE — Discharge Instructions (Addendum)
Continue using your inhaler as needed.  Please stop smoking.  If you are able to stop smoking, it will decrease the number of asthma attacks that you have.

## 2022-03-17 ENCOUNTER — Other Ambulatory Visit: Payer: Self-pay

## 2022-03-17 ENCOUNTER — Emergency Department (HOSPITAL_COMMUNITY)
Admission: EM | Admit: 2022-03-17 | Discharge: 2022-03-17 | Disposition: A | Discharge status: Home / Self Care | Payer: Medicaid Other | Attending: Emergency Medicine | Admitting: Emergency Medicine

## 2022-03-17 DIAGNOSIS — N9489 Other specified conditions associated with female genital organs and menstrual cycle: Secondary | ICD-10-CM | POA: Diagnosis not present

## 2022-03-17 DIAGNOSIS — R000 Tachycardia, unspecified: Secondary | ICD-10-CM | POA: Diagnosis not present

## 2022-03-17 DIAGNOSIS — O26891 Other specified pregnancy related conditions, first trimester: Secondary | ICD-10-CM | POA: Insufficient documentation

## 2022-03-17 DIAGNOSIS — J45909 Unspecified asthma, uncomplicated: Secondary | ICD-10-CM | POA: Diagnosis not present

## 2022-03-17 DIAGNOSIS — W19XXXA Unspecified fall, initial encounter: Secondary | ICD-10-CM

## 2022-03-17 DIAGNOSIS — Z7951 Long term (current) use of inhaled steroids: Secondary | ICD-10-CM | POA: Diagnosis not present

## 2022-03-17 DIAGNOSIS — Z3A01 Less than 8 weeks gestation of pregnancy: Secondary | ICD-10-CM | POA: Diagnosis not present

## 2022-03-17 DIAGNOSIS — R109 Unspecified abdominal pain: Secondary | ICD-10-CM | POA: Insufficient documentation

## 2022-03-17 LAB — CBC WITH DIFFERENTIAL/PLATELET
Abs Immature Granulocytes: 0.02 10*3/uL (ref 0.00–0.07)
Basophils Absolute: 0 10*3/uL (ref 0.0–0.1)
Basophils Relative: 0 %
Eosinophils Absolute: 0 10*3/uL (ref 0.0–0.5)
Eosinophils Relative: 0 %
HCT: 41 % (ref 36.0–46.0)
Hemoglobin: 13.3 g/dL (ref 12.0–15.0)
Immature Granulocytes: 0 %
Lymphocytes Relative: 28 %
Lymphs Abs: 1.9 10*3/uL (ref 0.7–4.0)
MCH: 29.8 pg (ref 26.0–34.0)
MCHC: 32.4 g/dL (ref 30.0–36.0)
MCV: 91.9 fL (ref 80.0–100.0)
Monocytes Absolute: 0.3 10*3/uL (ref 0.1–1.0)
Monocytes Relative: 5 %
Neutro Abs: 4.4 10*3/uL (ref 1.7–7.7)
Neutrophils Relative %: 67 %
Platelets: 185 10*3/uL (ref 150–400)
RBC: 4.46 MIL/uL (ref 3.87–5.11)
RDW: 12.6 % (ref 11.5–15.5)
WBC: 6.6 10*3/uL (ref 4.0–10.5)
nRBC: 0 % (ref 0.0–0.2)

## 2022-03-17 LAB — POC URINE PREG, ED: Preg Test, Ur: NEGATIVE

## 2022-03-17 LAB — BASIC METABOLIC PANEL
Anion gap: 7 (ref 5–15)
BUN: 14 mg/dL (ref 6–20)
CO2: 23 mmol/L (ref 22–32)
Calcium: 8.6 mg/dL — ABNORMAL LOW (ref 8.9–10.3)
Chloride: 108 mmol/L (ref 98–111)
Creatinine, Ser: 0.76 mg/dL (ref 0.44–1.00)
GFR, Estimated: >60 mL/min (ref >60)
Glucose, Bld: 101 mg/dL — ABNORMAL HIGH (ref 70–99)
Potassium: 2.9 mmol/L — ABNORMAL LOW (ref 3.5–5.1)
Sodium: 138 mmol/L (ref 135–145)

## 2022-03-17 LAB — ABO/RH: ABO/RH(D): B POS

## 2022-03-17 LAB — HCG, QUANTITATIVE, PREGNANCY: hCG, Beta Chain, Quant, S: <1 m[IU]/mL (ref <5)

## 2022-03-17 NOTE — Discharge Instructions (Signed)
You are not pregnant.  Take Tylenol Motrin for pain.  Return for new or worsening symptoms.

## 2022-03-17 NOTE — ED Provider Notes (Signed)
[facility] EMERGENCY DEPARTMENT Provider Note   CSN: [id number] Arrival date & time: 03/17/22  4098     History  Chief Complaint  Patient presents with   Taylor Jefferson is a 21 y.o. female.   Fall   Patient with medical history of asthma, MDD, ADD presents today due to fall.  Patient was walking to her vehicle when she tripped due to rain and landed on her bottom.  She did not hit her head or her abdomen.  She is complaining of abdominal cramping which was worse when it initially started but has been improving.  She denies any vaginal bleeding, states she is roughly [redacted] weeks pregnant.  States it was confirmed at a clinic at [facility], no history of previous pregnancies.  Home Medications Prior to Admission medications   Medication Sig Start Date End Date Taking? Authorizing Provider  albuterol (PROVENTIL) (2.5 MG/3ML) 0.083% nebulizer solution Take 3 mLs (2.5 mg total) by nebulization every 4 (four) hours as needed for wheezing or shortness of breath. 02/13/21   Vella Kohler, MD  albuterol (VENTOLIN HFA) 108 (90 Base) MCG/ACT inhaler Inhale 2 puffs into the lungs every 4 (four) hours as needed (Cough). USE WITH SPACER 02/13/21   Vella Kohler, MD  medroxyPROGESTERone (DEPO-PROVERA) 150 MG/ML injection Inject 1 mL (150 mg total) into the muscle every 3 (three) months. 07/31/21   Adline Potter, NP  montelukast (SINGULAIR) 10 MG tablet Take 1 tablet (10 mg total) by mouth at bedtime. 05/19/21   Armandina Stammer I, NP  OXcarbazepine (TRILEPTAL) 150 MG tablet Take 1 tablet (150 mg total) by mouth 2 (two) times daily. For mood stabilization 05/19/21   Armandina Stammer I, NP  predniSONE (DELTASONE) 50 MG tablet Take 1 tablet (50 mg total) by mouth daily. 03/14/22   Dione Booze, MD  traZODone (DESYREL) 50 MG tablet Take 1 tablet (50 mg total) by mouth at bedtime as needed for sleep. 05/19/21   Sanjuana Kava, NP      Allergies    Other    Review of Systems   Review of  Systems  Physical Exam Updated Vital Signs BP 126/81   Pulse 84   Temp 98.5 F (36.9 C) (Oral)   Resp 17   Ht  (1.6 m)   Wt 53.1 kg   LMP 02/01/2022 Comment: OB confirmed at the office  SpO2 100%   BMI 20.73 kg/m  Physical Exam Vitals and nursing note reviewed. Exam conducted with a chaperone present.  Constitutional:      Appearance: Normal appearance.  HENT:     Head: Normocephalic.  Eyes:     General: No scleral icterus.       Right eye: No discharge.        Left eye: No discharge.     Extraocular Movements: Extraocular movements intact.     Pupils: Pupils are equal, round, and reactive to light.  Cardiovascular:     Rate and Rhythm: Regular rhythm. Tachycardia present.     Pulses: Normal pulses.     Heart sounds: Normal heart sounds. No murmur heard.   No friction rub. No gallop.     Comments: Mild tachycardia Pulmonary:     Effort: Pulmonary effort is normal. No respiratory distress.     Breath sounds: Normal breath sounds.  Abdominal:     General: Abdomen is flat. Bowel sounds are normal. There is no distension.     Palpations: Abdomen is soft.  Tenderness: There is no abdominal tenderness.     Comments: Abdomen is soft and not tender.  No peritoneal signs, no surgical scars  Skin:    General: Skin is warm and dry.     Coloration: Skin is not jaundiced.  Neurological:     Mental Status: She is alert. Mental status is at baseline.     Coordination: Coordination normal.    ED Results / Procedures / Treatments   Labs (all labs ordered are listed, but only abnormal results are displayed) Labs Reviewed  HCG, QUANTITATIVE, PREGNANCY  CBC WITH DIFFERENTIAL/PLATELET  BASIC METABOLIC PANEL  POC URINE PREG, ED  ABO/RH    EKG None  Radiology No results found.  Procedures Procedures    Medications Ordered in ED Medications - No data to display  ED Course/ Medical Decision Making/ A&P                           Medical Decision  Making Amount and/or Complexity of Data Reviewed Labs: ordered.   Patient presents due to fall.  Differential diagnosis includes but not limited to pregnancy complication, muscle strain, skeletal injury.  Patient states she is pregnant, she states this was confirmed by blood test emergency on which I am unable to find on care everywhere on past medical record review.  POC urine was negative, discussed with patient but she is adamant she is pregnant.  Will obtain hCG and the chance of possible false negative.  She did not hit her head, she is ambulatory on arrival.    Considered additional imaging but given no focal deficits, no lower extremity pain, no back pain do not think indicated at this time.  Patient was maintained on cardiac monitoring, I viewed the cardiac monitor on reevaluation and looks well.  Patient continues to deny any myalgias or muscle aches.  Abdominal pain is fully resolved.  Laboratory work-up ordered and she is confirmed to not be pregnant.  There is no leukocytosis or anemia, no gross electrolyte derangement.  Patient discharged in stable condition with return precautions discussed.        Final Clinical Impression(s) / ED Diagnoses Final diagnoses:  Fall, initial encounter    Rx / DC Orders ED Discharge Orders     None         Theron AristaSage, Marvin Maenza, Cordelia Poche-C 03/17/22 1053    Gloris Manchesterixon, Ryan, MD 03/18/22 [phone]

## 2022-03-17 NOTE — ED Triage Notes (Addendum)
Patient states that she fell walking to car after slipping on water. States that her stomach has been cramping since fall. Did not hit head. Cramping has improved since fall. Patient is [redacted] weeks pregnant. No vaginal bleeding reported.

## 2022-04-09 ENCOUNTER — Ambulatory Visit: Payer: Medicaid Other | Admitting: Adult Health

## 2022-05-03 ENCOUNTER — Ambulatory Visit
Admission: EM | Admit: 2022-05-03 | Discharge: 2022-05-03 | Disposition: A | Discharge status: Home / Self Care | Payer: Medicaid Other | Attending: Nurse Practitioner | Admitting: Nurse Practitioner

## 2022-05-03 ENCOUNTER — Encounter: Payer: Self-pay | Admitting: Emergency Medicine

## 2022-05-03 DIAGNOSIS — N898 Other specified noninflammatory disorders of vagina: Secondary | ICD-10-CM

## 2022-05-03 DIAGNOSIS — N930 Postcoital and contact bleeding: Secondary | ICD-10-CM | POA: Insufficient documentation

## 2022-05-03 LAB — POCT URINALYSIS DIP (MANUAL ENTRY)
Glucose, UA: NEGATIVE mg/dL
Leukocytes, UA: NEGATIVE
Nitrite, UA: NEGATIVE
Protein Ur, POC: 30 mg/dL — AB
Spec Grav, UA: 1.025 (ref 1.010–1.025)
Urobilinogen, UA: 0.2 E.U./dL
pH, UA: 6 (ref 5.0–8.0)

## 2022-05-03 LAB — POCT URINE PREGNANCY: Preg Test, Ur: NEGATIVE

## 2022-05-03 NOTE — ED Triage Notes (Signed)
States she is bleeding after having sex x 4 days.  States when she urinated, she noticed blood in the toilet.

## 2022-05-03 NOTE — Discharge Instructions (Addendum)
-   We are checking your blood counts today and will let you know if anything comes back abnormal -We are also checking for yeast infection, bacterial vaginosis, and other STI and we will call you if any of this comes back abnormal -Please use condoms with every sexual encounter -Follow-up with OB/GYN if symptoms persist or worsen

## 2022-05-03 NOTE — ED Provider Notes (Signed)
[facility]    CSN: [id number] Arrival date & time: 05/03/22  1531      History   Chief Complaint No chief complaint on file.   HPI Taylor Jefferson is a 21 y.o. female.   Patient presents with postcoital bleeding that has been ongoing for the past few years.  Reports that she has recently been with a new sexual partner and has been having more frequent sex.  Reports the bleeding is very heavy for the first few hours/half a day after sexual intercourse.  She also endorses vaginal discharge-states it is a thick, odorous discharge.  Denies vaginal itching or burning, dysuria, urinary urgency, new incontinence.  She is having some urinary frequency.  Not voiding smaller amounts.  No nausea/vomiting, abdominal pain, new back pain, fevers.  Denies any vaginal lesions, sores, skin changes.  Reports she was using the bathroom and felt dizzy/lightheaded and this concerned her because of how much blood she lost.  Reports last menstrual period was on April 20, 2022.  Recently stopped taking Depo shot due to side effects.    Past Medical History:  Diagnosis Date   ADD (attention deficit disorder)    Allergy    Amenorrhea 11/02/2014   Anxiety    Asthma    Chlamydia 09/27/2019   tx 12/7, POC________   Contraceptive education 04/04/2014   Contraceptive management 04-22-2014   Death of parent 08-14-2020   Depression    HA (headache)    History of chlamydia 10/25/2019   Irregular periods 03/17/2019   Patellar pain    Dr. 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[place]) 05/17/2021    Unspecified mood (affective) disorder ([place]) 05/17/2021   Anxiety disorder, unspecified 05/17/2021   Intentional overdose of drug in tablet form ([place]) 05/16/2021   Pregnancy examination or test, negative result 01/02/2021   Nexplanon in place 01/02/2021   Irregular intermenstrual bleeding 01/02/2021   Non-seasonal allergic rhinitis due to pollen 08/29/2020   Mild persistent asthma without complication [id number]   Flat feet, bilateral 10/09/2018   MDD (major depressive disorder), recurrent severe, without psychosis ([place]) 01/05/2018   Migraine without aura and without status migrainosus, not intractable 12/24/2016   Anxiety state 01/04/2015   Tension headache 01/04/2015   Contraceptive management 04/22/2014   Vaginal ulceration 04/04/2014    History reviewed. No pertinent surgical history.  OB History     Gravida  1   Para      Term      Preterm      AB      Living         SAB      IAB      Ectopic      Multiple      Live Births               Home Medications    Prior to Admission medications   Medication Sig Start Date End Date Taking? Authorizing Provider  Adalimumab (HUMIRA [place]) Inject 40 mg into the skin every Friday. Takes every other  Friday   Yes [provider]  albuterol (PROVENTIL) (2.5 MG/3ML) 0.083% nebulizer solution Take 3 mLs (2.5 mg total) by nebulization every 4 (four) hours as needed for wheezing or shortness of breath. 02/13/21   Vella Kohler, MD  albuterol (VENTOLIN HFA) 108 (90 Base) MCG/ACT inhaler Inhale 2 puffs into the lungs every 4 (four) hours as needed (Cough). USE WITH SPACER 02/13/21   Vella Kohler, MD  montelukast (SINGULAIR) 10 MG tablet Take 1 tablet (10 mg total) by mouth at bedtime. 05/19/21   Armandina Stammer I, NP  OXcarbazepine (TRILEPTAL) 150 MG tablet Take 1 tablet (150 mg total) by mouth 2 (two) times daily. For mood stabilization 05/19/21   Armandina Stammer I, NP  predniSONE (DELTASONE) 50 MG tablet Take 1 tablet  (50 mg total) by mouth daily. 03/14/22   Dione Booze, MD  traZODone (DESYREL) 50 MG tablet Take 1 tablet (50 mg total) by mouth at bedtime as needed for sleep. 05/19/21   Sanjuana Kava, NP    Family History Family History  Problem Relation Age of Onset   Depression Mother    HIV Mother    Other Mother        spinal stenosis   Glaucoma Mother    Migraines Mother    Heart disease Father    Hypertension Maternal Grandmother    Arthritis Maternal Grandmother        rheumatoid   Other Maternal Grandmother        acid reflux   Migraines Maternal Grandmother    Depression Maternal Grandmother    Hyperlipidemia Maternal Grandfather    ADD / ADHD Sister    ADD / ADHD Brother     Social History Social History   Tobacco Use   Smoking status: Former   Smokeless tobacco: Never  Building services engineer Use: Every day   Substances: Nicotine  Substance Use Topics   Alcohol use: No   Drug use: Not Currently    Types: Marijuana     Allergies   Other   Review of Systems Review of Systems Per HPI  Physical Exam Triage Vital Signs ED Triage Vitals  Enc Vitals Group     BP 05/03/22 1543 117/82     Pulse Rate 05/03/22 1543 80     Resp 05/03/22 1543 18     Temp 05/03/22 1543 98.1 F (36.7 C)     Temp Source 05/03/22 1543 Oral     SpO2 05/03/22 1543 98 %     Weight --      Height --      Head Circumference --      Peak Flow --      Pain Score 05/03/22 1545 0     Pain Loc --      Pain Edu? --      Excl. in GC? --    No data found.  Updated Vital Signs BP 117/82 (BP Location: Right Arm)   Pulse 80   Temp 98.1 F (36.7 C) (Oral)   Resp 18   LMP 04/20/2022 (Exact Date)   SpO2 98%   Breastfeeding No   Visual Acuity Right Eye Distance:   Left Eye Distance:   Bilateral Distance:    Right Eye Near:   Left Eye Near:    Bilateral Near:     Physical Exam Vitals and nursing note reviewed.  Constitutional:      General: She is not in acute distress.    Appearance:  Normal appearance. She is not toxic-appearing.  Genitourinary:    Comments: Deferred-self swab obtained Skin:    General: Skin is warm and dry.     Coloration: Skin is not jaundiced or pale.     Findings: No erythema.  Neurological:     Mental Status: She is alert and oriented to person, place, and time.     Motor: No weakness.     Gait: Gait normal.  Psychiatric:        Behavior: Behavior is cooperative.      UC Treatments / Results  Labs (all labs ordered are listed, but only abnormal results are displayed) Labs Reviewed  POCT URINALYSIS DIP (MANUAL ENTRY) - Abnormal; Notable for the following components:      Result Value   Bilirubin, UA small (*)    Ketones, POC UA large (80) (*)    Blood, UA large (*)    Protein Ur, POC =30 (*)    All other components within normal limits  RPR  HIV ANTIBODY (ROUTINE TESTING W REFLEX)  CBC WITH DIFFERENTIAL/PLATELET  POCT URINE PREGNANCY  CERVICOVAGINAL ANCILLARY ONLY    EKG   Radiology No results found.  Procedures Procedures (including critical care time)  Medications Ordered in UC Medications - No data to display  Initial Impression / Assessment and Plan / UC Course  I have reviewed the triage vital signs and the nursing notes.  Pertinent labs & imaging results that were available during my care of the patient were reviewed by me and considered in my medical decision making (see chart for details).    Patient is a very pleasant, well-appearing 21 year old female presenting for postcoital bleeding today.  This has been ongoing for the patient for the past number of years.  Urine pregnancy test today negative; urinalysis does not show signs of acute urinary tract infection.  We will check a CBC to rule out anemia today along with STI testing, also test self swab for yeast vaginitis bacterial vaginosis.  Treat as indicated.  If her blood counts comes back anemic, I have recommended follow-up with an OB/GYN for further  evaluation.  Encouraged condom use with every sexual encounter. Final Clinical Impressions(s) / UC Diagnoses   Final diagnoses:  Postcoital bleeding  Vaginal discharge     Discharge Instructions      - We are checking your blood counts today and will let you know if anything comes back abnormal -We are also checking for yeast infection, bacterial vaginosis, and other STI and we will call you if any of this comes back abnormal -Please use condoms with every sexual encounter -Follow-up with OB/GYN if symptoms persist or worsen    ED Prescriptions   None    PDMP not reviewed this encounter.   Valentino Nose, NP 05/03/22 [phone]

## 2022-05-04 LAB — CBC WITH DIFFERENTIAL/PLATELET
Basophils Absolute: 0 10*3/uL (ref 0.0–0.2)
Basos: 0 %
EOS (ABSOLUTE): 0.2 10*3/uL (ref 0.0–0.4)
Eos: 3 %
Hematocrit: 45.9 % (ref 34.0–46.6)
Hemoglobin: 15.9 g/dL (ref 11.1–15.9)
Immature Grans (Abs): 0 10*3/uL (ref 0.0–0.1)
Immature Granulocytes: 0 %
Lymphocytes Absolute: 1.4 10*3/uL (ref 0.7–3.1)
Lymphs: 23 %
MCH: 29.9 pg (ref 26.6–33.0)
MCHC: 34.6 g/dL (ref 31.5–35.7)
MCV: 86 fL (ref 79–97)
Monocytes Absolute: 0.4 10*3/uL (ref 0.1–0.9)
Monocytes: 7 %
Neutrophils Absolute: 4 10*3/uL (ref 1.4–7.0)
Neutrophils: 67 %
Platelets: 181 10*3/uL (ref 150–450)
RBC: 5.31 x10E6/uL — ABNORMAL HIGH (ref 3.77–5.28)
RDW: 12.8 % (ref 11.7–15.4)
WBC: 5.9 10*3/uL (ref 3.4–10.8)

## 2022-05-04 LAB — RPR: RPR Ser Ql: NONREACTIVE

## 2022-05-04 LAB — HIV ANTIBODY (ROUTINE TESTING W REFLEX): HIV Screen 4th Generation wRfx: NONREACTIVE

## 2022-05-06 LAB — CERVICOVAGINAL ANCILLARY ONLY
Bacterial Vaginitis (gardnerella): POSITIVE — AB
Candida Glabrata: NEGATIVE
Candida Vaginitis: NEGATIVE
Chlamydia: POSITIVE — AB
Comment: NEGATIVE
Comment: NEGATIVE
Comment: NEGATIVE
Comment: NEGATIVE
Comment: NEGATIVE
Comment: NORMAL
Neisseria Gonorrhea: NEGATIVE
Trichomonas: NEGATIVE

## 2022-05-09 ENCOUNTER — Telehealth: Payer: Self-pay | Admitting: Family Medicine

## 2022-05-09 MED ORDER — METRONIDAZOLE 500 MG PO TABS: 500.0000 mg | ORAL_TABLET | Freq: Two times a day (BID) | ORAL | 0 refills | Status: DC

## 2022-05-09 MED ORDER — DOXYCYCLINE HYCLATE 100 MG PO CAPS: 100.0000 mg | ORAL_CAPSULE | Freq: Two times a day (BID) | ORAL | 0 refills | Status: DC

## 2022-05-09 NOTE — Telephone Encounter (Signed)
Patient here stating that she got a message that her medications were sent in for chlamydia and bacterial vaginosis but that the pharmacy did not have them.  It appears per chart review that they were not sent yet.  Medications now sent.

## 2022-05-30 ENCOUNTER — Ambulatory Visit (INDEPENDENT_AMBULATORY_CARE_PROVIDER_SITE_OTHER): Payer: Medicaid Other | Admitting: Adult Health

## 2022-05-30 ENCOUNTER — Encounter: Payer: Self-pay | Admitting: Adult Health

## 2022-05-30 ENCOUNTER — Other Ambulatory Visit (HOSPITAL_COMMUNITY)
Admission: RE | Admit: 2022-05-30 | Discharge: 2022-05-30 | Disposition: A | Discharge status: Home / Self Care | Payer: Medicaid Other | Source: Ambulatory Visit | Attending: Adult Health | Admitting: Adult Health

## 2022-05-30 VITALS — BP 123/86 | HR 68 | Ht 63.0 in | Wt 118.0 lb

## 2022-05-30 DIAGNOSIS — Z113 Encounter for screening for infections with a predominantly sexual mode of transmission: Secondary | ICD-10-CM | POA: Insufficient documentation

## 2022-05-30 DIAGNOSIS — Z8619 Personal history of other infectious and parasitic diseases: Secondary | ICD-10-CM | POA: Insufficient documentation

## 2022-05-30 DIAGNOSIS — B009 Herpesviral infection, unspecified: Secondary | ICD-10-CM

## 2022-05-30 MED ORDER — AZITHROMYCIN 500 MG PO TABS: ORAL_TABLET | ORAL | 0 refills | Status: DC

## 2022-05-30 MED ORDER — VALACYCLOVIR HCL 1 G PO TABS: ORAL_TABLET | ORAL | 12 refills | Status: DC

## 2022-05-30 NOTE — Progress Notes (Signed)
  Subjective:     Patient ID: Taylor Jefferson, female   DOB: 16-Feb-2001, 21 y.o.   MRN: [id number]  HPI Taylor Jefferson is a 21 year old black female,single, G1P0, in complaining of herpes outbreak and needs to be retreated for chlamydia  did not take doxycycline correctly.   Review of Systems Has herpes out break Reviewed past medical,surgical, social and family history. Reviewed medications and allergies.     Objective:   Physical Exam BP 123/86 (BP Location: Right Arm, Patient Position: Sitting, Cuff Size: Normal)   Pulse 68   Ht 5\' 3"  (1.6 m)   Wt 118 lb (53.5 kg)   LMP 05/27/2022 (Exact Date)   BMI 20.90 kg/m     Skin warm and dry.Pelvic: external genitalia is normal in appearance no lesions, vagina: +period blood,?vesicle at introitus,urethra has no lesions or masses noted, cervix:smooth, uterus: normal size, shape and contour, non tender, no masses felt, adnexa: no masses or tenderness noted. Bladder is non tender and no masses felt. CV swab obtained.  Upstream - 05/30/22 1633       Pregnancy Intention Screening   Does the patient want to become pregnant in the next year? No    Does the patient's partner want to become pregnant in the next year? No    Would the patient like to discuss contraceptive options today? No      Contraception Wrap Up   Current Method No Contraceptive Precautions    End Method No Contraception Precautions    Contraception Counseling Provided No            Examination chaperoned by 07/30/22 LPN  Assessment:     1. Herpes Will rx valtrex for now and suppressive Meds ordered this encounter  Medications   valACYclovir (VALTREX) 1000 MG tablet    Sig: Take 1 bid x 10 days then 1 daily    Dispense:  40 tablet    Refill:  12    Order Specific Question:   Supervising Provider    Answer:   Faith Rogue H [[id number]]   azithromycin (ZITHROMAX) 500 MG tablet    Sig: Take 2 now po    Dispense:  2 tablet    Refill:  0    Order Specific Question:    Supervising Provider    Answer:   Duane Lope, LUTHER H [[id number]]     2. History of chlamydia CV swab obtained Will treat with azithromycin  1 gm  3. Screening examination for STD (sexually transmitted disease) CV swab obtained for GC/CHL,trich.BV and yeast     Plan:     Follow up prn

## 2022-05-31 ENCOUNTER — Emergency Department (HOSPITAL_COMMUNITY)
Admission: EM | Admit: 2022-05-31 | Discharge: 2022-05-31 | Discharge status: Against Medical Advice | Payer: Medicaid Other

## 2022-05-31 NOTE — ED Notes (Signed)
Pt called for triage- no answerCorrie Jefferson with registration informed me that pt left to go get something to eat- pt will be taken off ED board

## 2022-06-01 ENCOUNTER — Other Ambulatory Visit: Payer: Self-pay

## 2022-06-01 ENCOUNTER — Encounter (HOSPITAL_COMMUNITY): Payer: Self-pay | Admitting: Emergency Medicine

## 2022-06-01 ENCOUNTER — Emergency Department (HOSPITAL_COMMUNITY): Payer: Medicaid Other

## 2022-06-01 ENCOUNTER — Emergency Department (HOSPITAL_COMMUNITY)
Admission: EM | Admit: 2022-06-01 | Discharge: 2022-06-01 | Disposition: A | Discharge status: Home / Self Care | Payer: Medicaid Other | Attending: Emergency Medicine | Admitting: Emergency Medicine

## 2022-06-01 DIAGNOSIS — R55 Syncope and collapse: Secondary | ICD-10-CM | POA: Insufficient documentation

## 2022-06-01 LAB — COMPREHENSIVE METABOLIC PANEL
ALT: 16 U/L (ref 0–44)
AST: 16 U/L (ref 15–41)
Albumin: 4.4 g/dL (ref 3.5–5.0)
Alkaline Phosphatase: 104 U/L (ref 38–126)
Anion gap: 9 (ref 5–15)
BUN: 14 mg/dL (ref 6–20)
CO2: 27 mmol/L (ref 22–32)
Calcium: 9.3 mg/dL (ref 8.9–10.3)
Chloride: 105 mmol/L (ref 98–111)
Creatinine, Ser: 0.89 mg/dL (ref 0.44–1.00)
GFR, Estimated: >60 mL/min (ref >60)
Glucose, Bld: 64 mg/dL — ABNORMAL LOW (ref 70–99)
Potassium: 3.3 mmol/L — ABNORMAL LOW (ref 3.5–5.1)
Sodium: 141 mmol/L (ref 135–145)
Total Bilirubin: 1.1 mg/dL (ref 0.3–1.2)
Total Protein: 8 g/dL (ref 6.5–8.1)

## 2022-06-01 LAB — CBC WITH DIFFERENTIAL/PLATELET
Abs Immature Granulocytes: 0.01 10*3/uL (ref 0.00–0.07)
Basophils Absolute: 0 10*3/uL (ref 0.0–0.1)
Basophils Relative: 1 %
Eosinophils Absolute: 0.1 10*3/uL (ref 0.0–0.5)
Eosinophils Relative: 3 %
HCT: 43.6 % (ref 36.0–46.0)
Hemoglobin: 14.5 g/dL (ref 12.0–15.0)
Immature Granulocytes: 0 %
Lymphocytes Relative: 49 %
Lymphs Abs: 2.6 10*3/uL (ref 0.7–4.0)
MCH: 30 pg (ref 26.0–34.0)
MCHC: 33.3 g/dL (ref 30.0–36.0)
MCV: 90.1 fL (ref 80.0–100.0)
Monocytes Absolute: 0.4 10*3/uL (ref 0.1–1.0)
Monocytes Relative: 7 %
Neutro Abs: 2.1 10*3/uL (ref 1.7–7.7)
Neutrophils Relative %: 40 %
Platelets: 225 10*3/uL (ref 150–400)
RBC: 4.84 MIL/uL (ref 3.87–5.11)
RDW: 12.4 % (ref 11.5–15.5)
WBC: 5.2 10*3/uL (ref 4.0–10.5)
nRBC: 0 % (ref 0.0–0.2)

## 2022-06-01 LAB — HCG, QUANTITATIVE, PREGNANCY: hCG, Beta Chain, Quant, S: <1 m[IU]/mL (ref <5)

## 2022-06-01 MED ORDER — LACTATED RINGERS IV BOLUS
1000.0000 mL | Freq: Once | INTRAVENOUS | Status: AC
  Administered 2022-06-01: 1000 mL via INTRAVENOUS

## 2022-06-01 NOTE — ED Provider Notes (Signed)
[facility] EMERGENCY DEPARTMENT Provider Note   CSN: [id number] Arrival date & time: 06/01/22  0044     History {Add pertinent medical, surgical, social history, OB history to HPI:1} Chief Complaint  Patient presents with   Loss of Consciousness    Taylor Jefferson is a 21 y.o. female.   Loss of Consciousness      Home Medications Prior to Admission medications   Medication Sig Start Date End Date Taking? Authorizing Provider  Adalimumab (HUMIRA [place]) Inject 40 mg into the skin every Friday. Takes every other Friday    [provider]  albuterol (PROVENTIL) (2.5 MG/3ML) 0.083% nebulizer solution Take 3 mLs (2.5 mg total) by nebulization every 4 (four) hours as needed for wheezing or shortness of breath. 02/13/21   Vella Kohler, MD  albuterol (VENTOLIN HFA) 108 (90 Base) MCG/ACT inhaler Inhale 2 puffs into the lungs every 4 (four) hours as needed (Cough). USE WITH SPACER 02/13/21   Vella Kohler, MD  azithromycin (ZITHROMAX) 500 MG tablet Take 2 now po 05/30/22   Cyril Mourning A, NP  montelukast (SINGULAIR) 10 MG tablet Take 1 tablet (10 mg total) by mouth at bedtime. 05/19/21   Armandina Stammer I, NP  valACYclovir (VALTREX) 1000 MG tablet Take 1 bid x 10 days then 1 daily 05/30/22   Adline Potter, NP      Allergies    Other    Review of Systems   Review of Systems  Cardiovascular:  Positive for syncope.    Physical Exam Updated Vital Signs BP 120/77 (BP Location: Left Arm)   Pulse 94   Temp 98.1 F (36.7 C) (Oral)   Resp 16   Ht 5\' 3"  (1.6 m)   Wt 54 kg   LMP 05/27/2022 (Exact Date)   SpO2 99%   BMI 21.09 kg/m  Physical Exam  ED Results / Procedures / Treatments   Labs (all labs ordered are listed, but only abnormal results are displayed) Labs Reviewed  COMPREHENSIVE METABOLIC PANEL - Abnormal; Notable for the following components:      Result Value   Potassium 3.3 (*)    Glucose, Bld 64 (*)    All other components within normal limits   CBC WITH DIFFERENTIAL/PLATELET  HCG, QUANTITATIVE, PREGNANCY    EKG None  Radiology DG Finger Middle Left  Result Date: 06/01/2022 CLINICAL DATA:  Status post trauma. EXAM: LEFT MIDDLE FINGER 2+V COMPARISON:  None Available. FINDINGS: There is no evidence of fracture or dislocation. There is no evidence of arthropathy or other focal bone abnormality. Soft tissues are unremarkable. IMPRESSION: Negative. Electronically Signed   By: 08/01/2022 M.D.   On: 06/01/2022 03:22    Procedures Procedures  {Document cardiac monitor, telemetry assessment procedure when appropriate:1}  Medications Ordered in ED Medications  lactated ringers bolus 1,000 mL (0 mLs Intravenous Stopped 06/01/22 0408)    ED Course/ Medical Decision Making/ A&P                           Medical Decision Making Amount and/or Complexity of Data Reviewed Labs: ordered. Radiology: ordered. ECG/medicine tests: ordered.   ***  {Document critical care time when appropriate:1} {Document review of labs and clinical decision tools ie heart score, Chads2Vasc2 etc:1}  {Document your independent review of radiology images, and any outside records:1} {Document your discussion with family members, caretakers, and with consultants:1} {Document social determinants of health affecting pt's care:1} {Document your decision making  why or why not admission, treatments were needed:1} Final Clinical Impression(s) / ED Diagnoses Final diagnoses:  Near syncope    Rx / DC Orders ED Discharge Orders     None

## 2022-06-01 NOTE — ED Triage Notes (Signed)
Pt with 2 syncopal episodes yesterday as well as c/o L middle finger injury after hitting it on the side of her bed on Thursday.

## 2022-06-03 LAB — CERVICOVAGINAL ANCILLARY ONLY
Bacterial Vaginitis (gardnerella): NEGATIVE
Candida Glabrata: NEGATIVE
Candida Vaginitis: NEGATIVE
Chlamydia: NEGATIVE
Comment: NEGATIVE
Comment: NEGATIVE
Comment: NEGATIVE
Comment: NEGATIVE
Comment: NEGATIVE
Comment: NORMAL
Neisseria Gonorrhea: NEGATIVE
Trichomonas: NEGATIVE

## 2022-06-16 DIAGNOSIS — J45909 Unspecified asthma, uncomplicated: Secondary | ICD-10-CM | POA: Insufficient documentation

## 2022-06-16 DIAGNOSIS — A6009 Herpesviral infection of other urogenital tract: Secondary | ICD-10-CM | POA: Insufficient documentation

## 2022-06-17 ENCOUNTER — Other Ambulatory Visit: Payer: Self-pay

## 2022-06-17 ENCOUNTER — Encounter (HOSPITAL_COMMUNITY): Payer: Self-pay | Admitting: Emergency Medicine

## 2022-06-17 ENCOUNTER — Emergency Department (HOSPITAL_COMMUNITY)
Admission: EM | Admit: 2022-06-17 | Discharge: 2022-06-17 | Disposition: A | Discharge status: Home / Self Care | Payer: Medicaid Other | Attending: Emergency Medicine | Admitting: Emergency Medicine

## 2022-06-17 DIAGNOSIS — A6009 Herpesviral infection of other urogenital tract: Secondary | ICD-10-CM

## 2022-06-17 MED ORDER — VALACYCLOVIR HCL 1 G PO TABS: ORAL_TABLET | ORAL | 0 refills | Status: DC

## 2022-06-17 NOTE — ED Triage Notes (Signed)
Pt states she is having a herpes outbreak. States started 2 days ago.

## 2022-06-17 NOTE — ED Provider Notes (Signed)
[facility] EMERGENCY DEPARTMENT Provider Note   CSN: [id number] Arrival date & time: 06/16/22  2315     History  Chief Complaint  Patient presents with   SEXUALLY TRANSMITTED DISEASE    Taylor Jefferson is a 21 y.o. female.  The history is provided by the patient.  She has history of asthma, depression and comes in because of recurrent genital herpes.  She has been having frequent outbreaks of genital herpes and this 1 started 2 days ago.  She had seen her gynecologist on 8/10 and had been given a prescription for valacyclovir to take twice a day for 10 days and then continue taking once a day for ongoing suppression.  Of note, patient does take adalimumab.   Home Medications Prior to Admission medications   Medication Sig Start Date End Date Taking? Authorizing Provider  Adalimumab (HUMIRA [place]) Inject 40 mg into the skin every Friday. Takes every other Friday    [provider]  albuterol (PROVENTIL) (2.5 MG/3ML) 0.083% nebulizer solution Take 3 mLs (2.5 mg total) by nebulization every 4 (four) hours as needed for wheezing or shortness of breath. 02/13/21   Vella Kohler, MD  albuterol (VENTOLIN HFA) 108 (90 Base) MCG/ACT inhaler Inhale 2 puffs into the lungs every 4 (four) hours as needed (Cough). USE WITH SPACER 02/13/21   Vella Kohler, MD  montelukast (SINGULAIR) 10 MG tablet Take 1 tablet (10 mg total) by mouth at bedtime. 05/19/21   Armandina Stammer I, NP  valACYclovir (VALTREX) 1000 MG tablet Take 1 bid x 10 days then 1 daily 06/17/22   Dione Booze, MD      Allergies    Other    Review of Systems   Review of Systems  All other systems reviewed and are negative.   Physical Exam Updated Vital Signs BP 117/74 (BP Location: Right Arm)   Pulse 87   Temp 97.7 F (36.5 C) (Oral)   Resp 16   Ht 5\' 3"  (1.6 m)   Wt 54 kg   LMP 05/27/2022 (Exact Date)   SpO2 100%   BMI 21.09 kg/m  Physical Exam Vitals and nursing note reviewed. Exam conducted with a chaperone  present.   21 year old female, resting comfortably and in no acute distress. Vital signs are normal. Oxygen saturation is 100%, which is normal. Head is normocephalic and atraumatic. PERRLA, EOMI. Oropharynx is clear. Neck is nontender and supple without adenopathy or JVD. Back is nontender and there is no CVA tenderness. Lungs are clear without rales, wheezes, or rhonchi. Chest is nontender. Heart has regular rate and rhythm without murmur. Abdomen is soft, flat, nontender. Genitalia: Ulcers on an erythematous base noted at the posterior aspect of the introitus consistent with herpes genitalis. Extremities have no cyanosis or edema, full range of motion is present. Skin is warm and dry without rash. Neurologic: Mental status is normal, cranial nerves are intact, moves all extremities equally.  ED Results / Procedures / Treatments    Procedures Procedures    Medications Ordered in ED Medications - No data to display  ED Course/ Medical Decision Making/ A&P                           Medical Decision Making Risk Prescription drug management.   Recurrent genital herpes with failure of typical suppressive doses of antiviral medications.  This is likely secondary to her use of adalimumab.  I have reviewed her old records  and note an office visit on 05/30/2022 for recurrent herpes genitalis with prescription for valacyclovir including ongoing suppressive dose.  GC and Chlamydia swab at that time was negative.  She is likely to need higher doses of antivirals to maintain suppression as long as her immune system is being suppressed.  She is given a prescription for twice a day valacyclovir for the next 10 days and she is referred back to her gynecologist for discussion about possible adjustment of suppressive therapy.  May need to consider infectious disease consultation.  Final Clinical Impression(s) / ED Diagnoses Final diagnoses:  Herpes simplex of female genitalia    Rx / DC  Orders ED Discharge Orders          Ordered    valACYclovir (VALTREX) 1000 MG tablet        06/17/22 6203              Dione Booze, MD 06/17/22 [phone]

## 2022-06-17 NOTE — Discharge Instructions (Signed)
Please take the valacyclovir twice a day for the next 10 days.  After that, resume taking it once a day.  Please follow-up with your gynecologist.  Because you are on a medication which suppresses your immune system, you may need to actually stay on a higher dose of valacyclovir to prevent further outbreaks.  If there is any problem, you may need to see an infection specialist for further recommendations.

## 2022-09-16 ENCOUNTER — Other Ambulatory Visit: Payer: Self-pay

## 2022-09-16 ENCOUNTER — Encounter (HOSPITAL_COMMUNITY): Payer: Self-pay

## 2022-09-16 ENCOUNTER — Emergency Department (HOSPITAL_COMMUNITY)
Admission: EM | Admit: 2022-09-16 | Discharge: 2022-09-16 | Discharge status: Against Medical Advice | Payer: Medicaid Other | Attending: Emergency Medicine | Admitting: Emergency Medicine

## 2022-09-16 DIAGNOSIS — Z5321 Procedure and treatment not carried out due to patient leaving prior to being seen by health care provider: Secondary | ICD-10-CM | POA: Diagnosis not present

## 2022-09-16 DIAGNOSIS — R197 Diarrhea, unspecified: Secondary | ICD-10-CM | POA: Insufficient documentation

## 2022-09-16 DIAGNOSIS — R112 Nausea with vomiting, unspecified: Secondary | ICD-10-CM | POA: Insufficient documentation

## 2022-09-16 DIAGNOSIS — R1033 Periumbilical pain: Secondary | ICD-10-CM | POA: Insufficient documentation

## 2022-09-16 NOTE — ED Triage Notes (Signed)
Patient BIB [facility] from home. Abdominal pain for 2 days. Cramping behind her belly. Diarrhea and vomiting.

## 2022-09-16 NOTE — ED Provider Triage Note (Signed)
Emergency Medicine Provider Triage Evaluation Note  KHALEELAH YOWELL , a 21 y.o. female  was evaluated in triage.  Pt complains of periumbilical abdominal pain.  Patient's pain started 2 days ago.  Has had nausea and vomiting since Thanksgiving, but has only had this pain for the past 2 days.  Has had associated diarrhea as well.  Pain is worse with twisting motion.  There is no radiation.  No fevers, chest pain or shortness of breath.  No hematemesis, melena or hematochezia.  Does not believe she could be pregnant.  Describes the pain as an ache.  Review of Systems  Positive: As above Negative: As above  Physical Exam  BP 115/82 (BP Location: Right Arm)   Pulse (!) 105   Temp 98.5 F (36.9 C) (Oral)   Resp 18   SpO2 99%  Gen:   Awake, no distress   Resp:  Normal effort  MSK:   Moves extremities without difficulty  Other:    Medical Decision Making  Medically screening exam initiated at 6:12 PM.  Appropriate orders placed.  Isabel T Hush was informed that the remainder of the evaluation will be completed by another provider, this initial triage assessment does not replace that evaluation, and the importance of remaining in the ED until their evaluation is complete.     Michelle Piper, [place] 09/16/22 1813

## 2023-01-01 ENCOUNTER — Ambulatory Visit
Admission: EM | Admit: 2023-01-01 | Discharge: 2023-01-01 | Disposition: A | Discharge status: Home / Self Care | Payer: Medicaid Other | Attending: Family Medicine | Admitting: Family Medicine

## 2023-01-01 ENCOUNTER — Encounter: Payer: Self-pay | Admitting: Emergency Medicine

## 2023-01-01 DIAGNOSIS — J209 Acute bronchitis, unspecified: Secondary | ICD-10-CM

## 2023-01-01 DIAGNOSIS — J4521 Mild intermittent asthma with (acute) exacerbation: Secondary | ICD-10-CM | POA: Diagnosis not present

## 2023-01-01 DIAGNOSIS — J3089 Other allergic rhinitis: Secondary | ICD-10-CM | POA: Diagnosis not present

## 2023-01-01 MED ORDER — PROMETHAZINE-DM 6.25-15 MG/5ML PO SYRP: 5.0000 mL | ORAL_SOLUTION | Freq: Four times a day (QID) | ORAL | 0 refills | Status: DC | PRN

## 2023-01-01 MED ORDER — PREDNISONE 20 MG PO TABS: 40.0000 mg | ORAL_TABLET | Freq: Every day | ORAL | 0 refills | Status: DC

## 2023-01-01 NOTE — Discharge Instructions (Signed)
Continue your nebulizer treatments every 4-6 hours as needed and take the medications that I have prescribed.  Make sure you are taking Zyrtec and Flonase daily.

## 2023-01-01 NOTE — ED Triage Notes (Signed)
Cough, headache, body aches x 2 weeks.

## 2023-01-01 NOTE — ED Provider Notes (Signed)
[facility]    CSN: [id number] Arrival date & time: 01/01/23  1518      History   Chief Complaint No chief complaint on file.   HPI Taylor Jefferson is a 22 y.o. female.   Patient presenting today with 2-week history of hacking persistent cough, states she had cold symptoms over a week ago that all improved minus the cough and the cough has lingered.  States all of her family numbers who got sick around the same time ended up with bronchitis.  She denies chest pain, shortness of breath, current fever chills body aches.  Has a history of seasonal allergies and asthma, states she has been doing nebulizer treatments which do help for a short period of time.  Otherwise she was taking Mucinex and Robitussin when she had the cold-like symptoms but nothing currently.    Past Medical History:  Diagnosis Date   ADD (attention deficit disorder)    Allergy    Amenorrhea 11/02/2014   Anxiety    Asthma    Chlamydia 09/27/2019   tx 12/7, POC________   Contraceptive education 04/04/2014   Contraceptive management 05-04-2014   Death of parent Aug 26, 2020   Depression    HA (headache)    History of chlamydia 10/25/2019   Irregular periods 03/17/2019   Patellar pain    Dr. 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[employer]) 05/17/2021   Unspecified mood (affective) disorder ([place]) 05/17/2021   Anxiety disorder, unspecified 05/17/2021   Intentional overdose of  drug in tablet form ([place]) 05/16/2021   Pregnancy examination or test, negative result 01/02/2021   Nexplanon in place 01/02/2021   Irregular intermenstrual bleeding 01/02/2021   Non-seasonal allergic rhinitis due to pollen 08/29/2020   Mild persistent asthma without complication [id number]   Flat feet, bilateral 10/09/2018   MDD (major depressive disorder), recurrent severe, 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 No pertinent surgical history.  OB History     Gravida  1   Para      Term      Preterm      AB      Living         SAB      IAB      Ectopic      Multiple      Live Births               Home Medications    Prior to Admission medications   Medication Sig Start Date End Date Taking? Authorizing Provider  predniSONE (DELTASONE) 20 MG tablet Take 2 tablets (40 mg total) by mouth daily with breakfast. 01/01/23  Yes Volney American, PA-C  promethazine-dextromethorphan (PROMETHAZINE-DM) 6.25-15 MG/5ML syrup Take 5 mLs by mouth  4 (four) times daily as needed. 01/01/23  Yes Volney American, PA-C  Adalimumab (HUMIRA [place]) Inject 40 mg into the skin every Friday. Takes every other Friday    [provider]  albuterol (PROVENTIL) (2.5 MG/3ML) 0.083% nebulizer solution Take 3 mLs (2.5 mg total) by nebulization every 4 (four) hours as needed for wheezing or shortness of breath. 02/13/21   Mannie Stabile, MD  albuterol (VENTOLIN HFA) 108 (90 Base) MCG/ACT inhaler Inhale 2 puffs into the lungs every 4 (four) hours as needed (Cough). USE WITH SPACER 02/13/21   Mannie Stabile, MD  montelukast (SINGULAIR) 10 MG tablet Take 1 tablet (10 mg total) by mouth at bedtime. 05/19/21   Lindell Spar I, NP  valACYclovir (VALTREX) 1000  MG tablet Take 1 bid x 10 days then 1 daily [id number]   Delora Fuel, MD    Family History Family History  Problem Relation Age of Onset   Depression Mother    HIV Mother    Other Mother        spinal stenosis   Glaucoma Mother    Migraines Mother    Heart disease Father    Hypertension Maternal Grandmother    Arthritis Maternal Grandmother        rheumatoid   Other Maternal Grandmother        acid reflux   Migraines Maternal Grandmother    Depression Maternal Grandmother    Hyperlipidemia Maternal Grandfather    ADD / ADHD Sister    ADD / ADHD Brother     Social History Social History   Tobacco Use   Smoking status: Former   Smokeless tobacco: Never  Scientific laboratory technician Use: Every day   Substances: Nicotine  Substance Use Topics   Alcohol use: No   Drug use: Not Currently    Types: Marijuana     Allergies   Other   Review of Systems Review of Systems Per HPI  Physical Exam Triage Vital Signs ED Triage Vitals  Enc Vitals Group     BP 01/01/23 1523 109/70     Pulse Rate 01/01/23 1523 93     Resp 01/01/23 1523 18     Temp 01/01/23 1523 98.6 F (37 C)     Temp Source 01/01/23 1523 Oral     SpO2 01/01/23 1523 97 %     Weight --      Height --      Head Circumference --      Peak Flow --      Pain Score 01/01/23 1524 10     Pain Loc --      Pain Edu? --      Excl. in [place]? --    No data found.  Updated Vital Signs BP 109/70 (BP Location: Right Arm)   Pulse 93   Temp 98.6 F (37 C) (Oral)   Resp 18   LMP 12/23/2022 (Exact Date)   SpO2 97%   Visual Acuity Right Eye Distance:   Left Eye Distance:   Bilateral Distance:    Right Eye Near:   Left Eye Near:    Bilateral Near:     Physical Exam Vitals and nursing note reviewed.  Constitutional:      Appearance: Normal appearance.  HENT:     Head: Atraumatic.     Right Ear: Tympanic membrane and external ear normal.     Left Ear: Tympanic membrane and external ear normal.     Nose:  Rhinorrhea  present.     Mouth/Throat:     Mouth: Mucous membranes are moist.     Pharynx: Posterior oropharyngeal erythema present.  Eyes:     Extraocular Movements: Extraocular movements intact.     Conjunctiva/sclera: Conjunctivae normal.  Cardiovascular:     Rate and Rhythm: Normal rate and regular rhythm.     Heart sounds: Normal heart sounds.  Pulmonary:     Effort: Pulmonary effort is normal.     Breath sounds: Wheezing present. No rales.     Comments: Trace wheezes bilaterally Musculoskeletal:        General: Normal range of motion.     Cervical back: Normal range of motion and neck supple.  Skin:    General: Skin is warm and dry.  Neurological:     Mental Status: She is alert and oriented to person, place, and time.  Psychiatric:        Mood and Affect: Mood normal.        Thought Content: Thought content normal.      UC Treatments / Results  Labs (all labs ordered are listed, but only abnormal results are displayed) Labs Reviewed - No data to display  EKG   Radiology No results found.  Procedures Procedures (including critical care time)  Medications Ordered in UC Medications - No data to display  Initial Impression / Assessment and Plan / UC Course  I have reviewed the triage vital signs and the nursing notes.  Pertinent labs & imaging results that were available during my care of the patient were reviewed by me and considered in my medical decision making (see chart for details).     Vital signs and exam overall reassuring today, suggestive of viral bronchitis/asthma exacerbation.  Will treat with prednisone, Phenergan DM, continued albuterol treatments every 4-6 hours as needed.  Continue allergy regimen.  Return for worsening symptoms. Final Clinical Impressions(s) / UC Diagnoses   Final diagnoses:  Acute bronchitis, unspecified organism  Seasonal allergic rhinitis due to other allergic trigger  Mild intermittent asthma with acute exacerbation      Discharge Instructions      Continue your nebulizer treatments every 4-6 hours as needed and take the medications that I have prescribed.  Make sure you are taking Zyrtec and Flonase daily.    ED Prescriptions     Medication Sig Dispense Auth. Provider   predniSONE (DELTASONE) 20 MG tablet Take 2 tablets (40 mg total) by mouth daily with breakfast. 10 tablet Volney American, PA-C   promethazine-dextromethorphan (PROMETHAZINE-DM) 6.25-15 MG/5ML syrup Take 5 mLs by mouth 4 (four) times daily as needed. 100 mL Volney American, [place]      PDMP not reviewed this encounter.   Volney American, [place] 01/01/23 [id number]

## 2023-02-11 ENCOUNTER — Ambulatory Visit: Payer: Self-pay

## 2023-02-11 ENCOUNTER — Ambulatory Visit
Admission: EM | Admit: 2023-02-11 | Discharge: 2023-02-11 | Disposition: A | Discharge status: Home / Self Care | Payer: Medicaid Other | Attending: Nurse Practitioner | Admitting: Nurse Practitioner

## 2023-02-11 ENCOUNTER — Encounter: Payer: Self-pay | Admitting: Emergency Medicine

## 2023-02-11 DIAGNOSIS — Z1152 Encounter for screening for COVID-19: Secondary | ICD-10-CM | POA: Diagnosis not present

## 2023-02-11 DIAGNOSIS — J069 Acute upper respiratory infection, unspecified: Secondary | ICD-10-CM | POA: Diagnosis present

## 2023-02-11 LAB — POCT URINE PREGNANCY: Preg Test, Ur: NEGATIVE

## 2023-02-11 MED ORDER — BENZONATATE 100 MG PO CAPS: 100.0000 mg | ORAL_CAPSULE | Freq: Three times a day (TID) | ORAL | 0 refills | Status: DC | PRN

## 2023-02-11 MED ORDER — PROMETHAZINE-DM 6.25-15 MG/5ML PO SYRP: 5.0000 mL | ORAL_SOLUTION | Freq: Every evening | ORAL | 0 refills | Status: DC | PRN

## 2023-02-11 NOTE — ED Triage Notes (Signed)
Sore throat, body aches, headaches off and on for 3 weeks.  States she wants to rule out pregnancy.

## 2023-02-11 NOTE — ED Provider Notes (Signed)
[facility]    CSN: [id number] Arrival date & time: 02/11/23  1832      History   Chief Complaint No chief complaint on file.   HPI Taylor Jefferson is a 22 y.o. female.   Patient presents today for 1 day history of bodyaches, chills, congested and dry cough, runny and stuffy nose, sore throat, headache, ear pain, decreased appetite, and fatigue.  She denies fever, shortness of breath or chest pain, abdominal pain, nausea/vomiting, diarrhea, and loss of taste or smell.  Has been taking Mucinex for symptoms with minimal improvement.  Reports this is the third time she has been sick in the past few weeks.  She is requesting pregnancy test today.     Past Medical History:  Diagnosis Date   ADD (attention deficit disorder)    Allergy    Amenorrhea 11/02/2014   Anxiety    Asthma    Chlamydia 09/27/2019   tx 12/7, POC________   Contraceptive education 04/04/2014   Contraceptive management 2014/04/16   Death of parent 08-08-20   Depression    HA (headache)    History of chlamydia 10/25/2019   Irregular periods 03/17/2019   Patellar pain    Dr. 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   Precocious puberty 10/08/2011   Reflux    Reflux    Seasonal allergies    Suicidal thoughts 01/06/2018   Vaginal burning 11/02/2014   Vaginal discharge 11/02/2014   Vaginal ulceration 04/04/2014   Will culture for HSV GC/CHL and a wound culture was obtained    Patient Active Problem List   Diagnosis Date Noted   Screening examination for STD (sexually transmitted disease) 05/30/2022   Herpes 05/30/2022   History of chlamydia 05/30/2022   Marijuana use 05/17/2021   Recurrent major depressive disorder 05/17/2021   Unspecified mood (affective) disorder 05/17/2021   Anxiety disorder, unspecified 05/17/2021   Intentional overdose of drug in tablet form 05/16/2021   Pregnancy examination or test, negative result 01/02/2021   Nexplanon in place 01/02/2021    Irregular intermenstrual bleeding 01/02/2021   Non-seasonal allergic rhinitis due to pollen 08/29/2020   Mild persistent asthma without complication 02/29/2020   Flat feet, bilateral 10/09/2018   MDD (major depressive disorder), recurrent severe, without psychosis 01/05/2018   Migraine without aura and without status migrainosus, not intractable 12/24/2016   Anxiety state 01/04/2015   Tension headache 01/04/2015   Contraceptive management 04/16/2014   Vaginal ulceration 04/04/2014    History reviewed. No pertinent surgical history.  OB History     Gravida  1   Para      Term      Preterm      AB      Living         SAB      IAB      Ectopic      Multiple      Live Births               Home Medications    Prior to Admission medications   Medication Sig Start Date End Date Taking? Authorizing Provider  benzonatate (TESSALON) 100 MG capsule Take 1 capsule (100 mg total) by mouth 3 (three) times daily as needed for cough. Do not take with alcohol or while driving or operating heavy machinery.  May cause drowsiness. 02/11/23  Yes Valentino Nose, NP  promethazine-dextromethorphan (PROMETHAZINE-DM) 6.25-15 MG/5ML syrup Take 5 mLs by mouth at bedtime as needed for  cough. Do not take with alcohol or while driving or operating heavy machinery.  May cause drowsiness. 02/11/23  Yes Cathlean Marseilles A, NP  albuterol (PROVENTIL) (2.5 MG/3ML) 0.083% nebulizer solution Take 3 mLs (2.5 mg total) by nebulization every 4 (four) hours as needed for wheezing or shortness of breath. 02/13/21   Vella Kohler, MD  albuterol (VENTOLIN HFA) 108 (90 Base) MCG/ACT inhaler Inhale 2 puffs into the lungs every 4 (four) hours as needed (Cough). USE WITH SPACER 02/13/21   Vella Kohler, MD    Family History Family History  Problem Relation Age of Onset   Depression Mother    HIV Mother    Other Mother        spinal stenosis   Glaucoma Mother    Migraines Mother    Heart  disease Father    Hypertension Maternal Grandmother    Arthritis Maternal Grandmother        rheumatoid   Other Maternal Grandmother        acid reflux   Migraines Maternal Grandmother    Depression Maternal Grandmother    Hyperlipidemia Maternal Grandfather    ADD / ADHD Sister    ADD / ADHD Brother     Social History Social History   Tobacco Use   Smoking status: Former   Smokeless tobacco: Never  Building services engineer Use: Every day   Substances: Nicotine  Substance Use Topics   Alcohol use: No   Drug use: Not Currently    Types: Marijuana     Allergies   Other   Review of Systems Review of Systems Per HPI  Physical Exam Triage Vital Signs ED Triage Vitals  Enc Vitals Group     BP 02/11/23 1836 113/74     Pulse Rate 02/11/23 1836 99     Resp 02/11/23 1836 18     Temp 02/11/23 1836 97.8 F (36.6 C)     Temp Source 02/11/23 1836 Oral     SpO2 02/11/23 1836 98 %     Weight --      Height --      Head Circumference --      Peak Flow --      Pain Score 02/11/23 1837 0     Pain Loc --      Pain Edu? --      Excl. in [place]? --    No data found.  Updated Vital Signs BP 113/74 (BP Location: Right Arm)   Pulse 99   Temp 97.8 F (36.6 C) (Oral)   Resp 18   LMP 01/13/2023 (Approximate)   SpO2 98%   Visual Acuity Right Eye Distance:   Left Eye Distance:   Bilateral Distance:    Right Eye Near:   Left Eye Near:    Bilateral Near:     Physical Exam Vitals and nursing note reviewed.  Constitutional:      General: She is not in acute distress.    Appearance: Normal appearance. She is not ill-appearing or toxic-appearing.  HENT:     Head: Normocephalic and atraumatic.     Right Ear: Tympanic membrane, ear canal and external ear normal.     Left Ear: Tympanic membrane, ear canal and external ear normal.     Nose: Nose normal. No congestion or rhinorrhea.     Mouth/Throat:     Mouth: Mucous membranes are moist.     Pharynx: Oropharynx is clear.  Posterior oropharyngeal erythema present. No oropharyngeal exudate.  Comments: Post nasal drainage Eyes:     General: No scleral icterus.    Extraocular Movements: Extraocular movements intact.  Cardiovascular:     Rate and Rhythm: Normal rate and regular rhythm.  Pulmonary:     Effort: Pulmonary effort is normal. No respiratory distress.     Breath sounds: Normal breath sounds. No wheezing, rhonchi or rales.  Abdominal:     General: Abdomen is flat. Bowel sounds are normal. There is no distension.     Palpations: Abdomen is soft.  Musculoskeletal:     Cervical back: Normal range of motion and neck supple.  Lymphadenopathy:     Cervical: No cervical adenopathy.  Skin:    General: Skin is warm and dry.     Coloration: Skin is not jaundiced or pale.     Findings: No erythema or rash.  Neurological:     Mental Status: She is alert and oriented to person, place, and time.  Psychiatric:        Behavior: Behavior is cooperative.      UC Treatments / Results  Labs (all labs ordered are listed, but only abnormal results are displayed) Labs Reviewed  SARS CORONAVIRUS 2 (TAT 6-24 HRS)  POCT URINE PREGNANCY    EKG   Radiology No results found.  Procedures Procedures (including critical care time)  Medications Ordered in UC Medications - No data to display  Initial Impression / Assessment and Plan / UC Course  I have reviewed the triage vital signs and the nursing notes.  Pertinent labs & imaging results that were available during my care of the patient were reviewed by me and considered in my medical decision making (see chart for details).  Patient is well-appearing, normotensive, afebrile, not tachycardic, not tachypneic, oxygenating well on room air.      1. Viral URI with cough 2. Encounter for screening for COVID-19 Suspect viral etiology Vitals and examination today are reassuring Urine pregnancy test today is negative COVID-19 test is pending Supportive  care discussed Start cough suppressant medication Recommended starting Flonase for postnasal drainage ER and return precautions discussed with patient  The patient was given the opportunity to ask questions.  All questions answered to their satisfaction.  The patient is in agreement to this plan.    Final Clinical Impressions(s) / UC Diagnoses   Final diagnoses:  Viral URI with cough  Encounter for screening for COVID-19     Discharge Instructions      You have a viral upper respiratory infection.  Symptoms should improve over the next week to 10 days.  If you develop chest pain or shortness of breath, go to the emergency room.  We have tested you today for COVID-19.  You will see the results in Mychart and we will call you with positive results.  Please stay home and isolate until you are aware of the results.    Some things that can make you feel better are: - Increased rest - Increasing fluid with water/sugar free electrolytes - Acetaminophen and ibuprofen as needed for fever/pain - Flonase nasal spray for post nasal drainage - Salt water gargling, chloraseptic spray and throat lozenges for sore throat - OTC guaifenesin (Mucinex) 600 mg twice daily for congestion - Saline sinus flushes or a neti pot - Humidifying the air -Tessalon Perles during the day as needed for dry cough and cough syrup at nighttime as needed for dry cough     ED Prescriptions     Medication Sig Dispense Auth. Provider  benzonatate (TESSALON) 100 MG capsule Take 1 capsule (100 mg total) by mouth 3 (three) times daily as needed for cough. Do not take with alcohol or while driving or operating heavy machinery.  May cause drowsiness. 21 capsule Cathlean Marseilles A, NP   promethazine-dextromethorphan (PROMETHAZINE-DM) 6.25-15 MG/5ML syrup Take 5 mLs by mouth at bedtime as needed for cough. Do not take with alcohol or while driving or operating heavy machinery.  May cause drowsiness. 118 mL Valentino Nose, NP      PDMP not reviewed this encounter.   Valentino Nose, NP 02/11/23 [phone]

## 2023-02-11 NOTE — Discharge Instructions (Addendum)
You have a viral upper respiratory infection.  Symptoms should improve over the next week to 10 days.  If you develop chest pain or shortness of breath, go to the emergency room.  We have tested you today for COVID-19.  You will see the results in Mychart and we will call you with positive results.  Please stay home and isolate until you are aware of the results.    Some things that can make you feel better are: - Increased rest - Increasing fluid with water/sugar free electrolytes - Acetaminophen and ibuprofen as needed for fever/pain - Flonase nasal spray for post nasal drainage - Salt water gargling, chloraseptic spray and throat lozenges for sore throat - OTC guaifenesin (Mucinex) 600 mg twice daily for congestion - Saline sinus flushes or a neti pot - Humidifying the air -Tessalon Perles during the day as needed for dry cough and cough syrup at nighttime as needed for dry cough

## 2023-02-12 LAB — SARS CORONAVIRUS 2 (TAT 6-24 HRS): SARS Coronavirus 2: NEGATIVE

## 2023-03-14 ENCOUNTER — Encounter: Payer: Self-pay | Admitting: *Deleted

## 2023-05-02 ENCOUNTER — Ambulatory Visit
Admission: EM | Admit: 2023-05-02 | Discharge: 2023-05-02 | Disposition: A | Discharge status: Home / Self Care | Payer: Medicaid Other | Attending: Family Medicine | Admitting: Family Medicine

## 2023-05-02 DIAGNOSIS — B009 Herpesviral infection, unspecified: Secondary | ICD-10-CM | POA: Insufficient documentation

## 2023-05-02 DIAGNOSIS — R590 Localized enlarged lymph nodes: Secondary | ICD-10-CM | POA: Insufficient documentation

## 2023-05-02 MED ORDER — VALACYCLOVIR HCL 1 G PO TABS: 1000.0000 mg | ORAL_TABLET | Freq: Two times a day (BID) | ORAL | 0 refills | Status: AC

## 2023-05-02 NOTE — ED Triage Notes (Signed)
Pt c/o swollen lymph nodes in groin area x 2 days, pt states it started in the left swelling is visible, causes pain when moving around and laying down.

## 2023-05-02 NOTE — ED Provider Notes (Signed)
[facility]    CSN: [id number] Arrival date & time: 05/02/23  9562      History   Chief Complaint No chief complaint on file.   HPI Taylor Jefferson is a 22 y.o. female.   Patient presenting today with 2-day history of tender swollen lymph nodes to bilateral groin area.  She also states she has a burning and tingling lesion in her pubic region that she noticed around the same time.  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  Denies vaginal discharge, vaginal itching, pelvic or abdominal pain, urinary symptoms, known exposures to any new STDs.  Denies concern for pregnancy today.  Not trying anything for symptoms at this time.    Past Medical History:  Diagnosis Date   ADD (attention deficit disorder)    Allergy    Amenorrhea 11/02/2014   Anxiety    Asthma    Chlamydia 09/27/2019   tx 12/7, POC________   Contraceptive education 04/04/2014   Contraceptive management 04-26-2014   Death of parent Aug 18, 2020   Depression    HA (headache)    History of chlamydia 10/25/2019   Irregular periods 03/17/2019   Patellar pain    Dr. 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   Precocious puberty 10/08/2011   Reflux    Reflux    Seasonal allergies    Suicidal thoughts 01/06/2018   Vaginal burning 11/02/2014   Vaginal discharge 11/02/2014   Vaginal ulceration 04/04/2014   Will culture for HSV GC/CHL and a wound culture was obtained    Patient Active Problem List   Diagnosis Date Noted   Screening examination for STD (sexually transmitted disease) 05/30/2022   Herpes 05/30/2022   History of chlamydia 05/30/2022   Marijuana use 05/17/2021   Recurrent major depressive disorder (HCC) 05/17/2021   Unspecified mood (affective) disorder (HCC) 05/17/2021   Anxiety disorder, unspecified 05/17/2021   Intentional overdose of drug in tablet form (HCC) 05/16/2021   Pregnancy examination or test, negative result 01/02/2021   Nexplanon in place  01/02/2021   Irregular intermenstrual bleeding 01/02/2021   Non-seasonal allergic rhinitis due to pollen 08/29/2020   Mild persistent asthma without complication 02/29/2020   Flat feet, bilateral 10/09/2018   MDD (major depressive disorder), recurrent severe, 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   Migraine without aura and without status migrainosus, not intractable 12/24/2016   Anxiety state 01/04/2015   Tension headache 01/04/2015   Contraceptive management 04-26-2014   Vaginal ulceration 04/04/2014    History reviewed. No pertinent surgical history.  OB History     Gravida  1   Para      Term      Preterm      AB      Living         SAB      IAB      Ectopic      Multiple      Live Births               Home Medications    Prior to Admission medications   Medication Sig Start Date End Date Taking? Authorizing Provider  valACYclovir (VALTREX) 1000 MG tablet Take 1 tablet (1,000 mg total) by mouth 2 (two) times daily for 7 days. 05/02/23 05/09/23 Yes Particia Nearing, PA-C  valACYclovir (VALTREX) 500 MG tablet Take 500 mg by mouth 2 (two) times daily.   Yes [provider]  albuterol (PROVENTIL) (2.5 MG/3ML)  0.083% nebulizer solution Take 3 mLs (2.5 mg total) by nebulization every 4 (four) hours as needed for wheezing or shortness of breath. 02/13/21   Vella Kohler, MD  albuterol (VENTOLIN HFA) 108 (90 Base) MCG/ACT inhaler Inhale 2 puffs into the lungs every 4 (four) hours as needed (Cough). USE WITH SPACER 02/13/21   Vella Kohler, MD  benzonatate (TESSALON) 100 MG capsule Take 1 capsule (100 mg total) by mouth 3 (three) times daily as needed for cough. Do not take with alcohol or while driving or operating heavy machinery.  May cause drowsiness. 02/11/23   Valentino Nose, NP  promethazine-dextromethorphan (PROMETHAZINE-DM) 6.25-15 MG/5ML syrup Take 5 mLs by mouth at bedtime as needed for cough. Do not take with alcohol or while driving or  operating heavy machinery.  May cause drowsiness. 02/11/23   Valentino Nose, NP    Family History Family History  Problem Relation Age of Onset   Depression Mother    HIV Mother    Other Mother        spinal stenosis   Glaucoma Mother    Migraines Mother    Heart disease Father    Hypertension Maternal Grandmother    Arthritis Maternal Grandmother        rheumatoid   Other Maternal Grandmother        acid reflux   Migraines Maternal Grandmother    Depression Maternal Grandmother    Hyperlipidemia Maternal Grandfather    ADD / ADHD Sister    ADD / ADHD Brother     Social History Social History   Tobacco Use   Smoking status: Former   Smokeless tobacco: Never  Vaping Use   Vaping status: Every Day   Substances: Nicotine  Substance Use Topics   Alcohol use: No   Drug use: Not Currently    Types: Marijuana     Allergies   Other   Review of Systems Review of Systems Per HPI  Physical Exam Triage Vital Signs ED Triage Vitals  Encounter Vitals Group     BP 05/02/23 0949 (!) 128/90     Systolic BP Percentile --      Diastolic BP Percentile --      Pulse Rate 05/02/23 0949 93     Resp 05/02/23 0949 15     Temp 05/02/23 0949 98.2 F (36.8 C)     Temp Source 05/02/23 0949 Oral     SpO2 05/02/23 0949 99 %     Weight --      Height --      Head Circumference --      Peak Flow --      Pain Score 05/02/23 0952 2     Pain Loc --      Pain Education --      Exclude from Growth Chart --    No data found.  Updated Vital Signs BP (!) 128/90 (BP Location: Right Arm)   Pulse 93   Temp 98.2 F (36.8 C) (Oral)   Resp 15   LMP 03/22/2023 (Exact Date)   SpO2 99%   Visual Acuity Right Eye Distance:   Left Eye Distance:   Bilateral Distance:    Right Eye Near:   Left Eye Near:    Bilateral Near:     Physical Exam Vitals and nursing note reviewed. Exam conducted with a chaperone present.  Constitutional:      Appearance: Normal appearance. She is  not ill-appearing.  HENT:  Head: Atraumatic.  Eyes:     Extraocular Movements: Extraocular movements intact.     Conjunctiva/sclera: Conjunctivae normal.  Cardiovascular:     Rate and Rhythm: Normal rate and regular rhythm.     Heart sounds: Normal heart sounds.  Pulmonary:     Effort: Pulmonary effort is normal.     Breath sounds: Normal breath sounds.  Genitourinary:    Comments: Small ulcerated lesion just above the clitoral region on the mons pubis Musculoskeletal:        General: Normal range of motion.     Cervical back: Normal range of motion and neck supple.  Lymphadenopathy:     Lower Body: Right inguinal adenopathy present. Left inguinal adenopathy present.  Skin:    General: Skin is warm and dry.  Neurological:     Mental Status: She is alert and oriented to person, place, and time.  Psychiatric:        Mood and Affect: Mood normal.        Thought Content: Thought content normal.        Judgment: Judgment normal.      UC Treatments / Results  Labs (all labs ordered are listed, but only abnormal results are displayed) Labs Reviewed  CERVICOVAGINAL ANCILLARY ONLY    EKG   Radiology No results found.  Procedures Procedures (including critical care time)  Medications Ordered in UC Medications - No data to display  Initial Impression / Assessment and Plan / UC Course  I have reviewed the triage vital signs and the nursing notes.  Pertinent labs & imaging results that were available during my care of the patient were reviewed by me and considered in my medical decision making (see chart for details).     Suspect adenopathy related to HSV flare but will obtain vaginal swab for further rule out of infections.  Discussed warm compresses, ibuprofen for the adenopathy, Valtrex for HSV flare.  Follow-up with OB/GYN for recheck.  Final Clinical Impressions(s) / UC Diagnoses   Final diagnoses:  HSV infection  Inguinal adenopathy     Discharge  Instructions      I have sent over a course of antivirals to treat the herpes flare.  I suspect this is what is causing your adenopathy, but we have obtained a vaginal swab for further evaluation of possible other infections at this time.  You may use warm compresses and ibuprofen to help with the adenopathy and follow-up if things worsen.  We will call you if anything is abnormal with your vaginal swab.    ED Prescriptions     Medication Sig Dispense Auth. Provider   valACYclovir (VALTREX) 1000 MG tablet Take 1 tablet (1,000 mg total) by mouth 2 (two) times daily for 7 days. 14 tablet Particia Nearing, [place]      PDMP not reviewed this encounter.   Particia Nearing, [place] 05/02/23 1056

## 2023-05-02 NOTE — Discharge Instructions (Signed)
I have sent over a course of antivirals to treat the herpes flare.  I suspect this is what is causing your adenopathy, but we have obtained a vaginal swab for further evaluation of possible other infections at this time.  You may use warm compresses and ibuprofen to help with the adenopathy and follow-up if things worsen.  We will call you if anything is abnormal with your vaginal swab.

## 2023-05-05 LAB — CERVICOVAGINAL ANCILLARY ONLY
Bacterial Vaginitis (gardnerella): POSITIVE — AB
Candida Glabrata: NEGATIVE
Candida Vaginitis: NEGATIVE
Chlamydia: NEGATIVE
Comment: NEGATIVE
Comment: NEGATIVE
Comment: NEGATIVE
Comment: NEGATIVE
Comment: NEGATIVE
Comment: NORMAL
Neisseria Gonorrhea: NEGATIVE
Trichomonas: POSITIVE — AB

## 2023-05-06 ENCOUNTER — Telehealth (HOSPITAL_COMMUNITY): Payer: Self-pay | Admitting: Emergency Medicine

## 2023-05-06 MED ORDER — METRONIDAZOLE 500 MG PO TABS: 500.0000 mg | ORAL_TABLET | Freq: Two times a day (BID) | ORAL | 0 refills | Status: DC

## 2023-07-14 ENCOUNTER — Ambulatory Visit (HOSPITAL_COMMUNITY): Payer: Medicaid Other | Attending: Nurse Practitioner | Admitting: Occupational Therapy

## 2023-07-14 ENCOUNTER — Encounter (HOSPITAL_COMMUNITY): Payer: Self-pay | Admitting: Occupational Therapy

## 2023-07-14 ENCOUNTER — Other Ambulatory Visit: Payer: Self-pay

## 2023-07-14 DIAGNOSIS — M25511 Pain in right shoulder: Secondary | ICD-10-CM | POA: Diagnosis present

## 2023-07-14 DIAGNOSIS — R29898 Other symptoms and signs involving the musculoskeletal system: Secondary | ICD-10-CM | POA: Diagnosis present

## 2023-07-14 NOTE — Therapy (Signed)
OUTPATIENT OCCUPATIONAL THERAPY ORTHO EVALUATION  Patient Name: Taylor Jefferson MRN: [id number] DOB:Jun 22, 2001, 22 y.o., female Today's Date: 07/14/2023   END OF SESSION:  OT End of Session - 07/14/23 1110     Visit Number 1    Number of Visits 4    Date for OT Re-Evaluation 08/13/23    Authorization Type [place] Medicaid    Authorization Time Period Requesting 4 visits    Authorization - Visit Number 0    Authorization - Number of Visits 4    OT Start Time 1035    OT Stop Time 1103    OT Time Calculation (min) 28 min    Activity Tolerance Patient tolerated treatment well    Behavior During Therapy WFL for tasks assessed/performed             Past Medical History:  Diagnosis Date   ADD (attention deficit disorder)    Allergy    Amenorrhea 11/02/2014   Anxiety    Asthma    Chlamydia 09/27/2019   tx 12/7, POC________   Contraceptive education 04/04/2014   Contraceptive management 05/03/2014   Death of parent 08-25-2020   Depression    HA (headache)    History of chlamydia 10/25/2019   Irregular periods 03/17/2019   Patellar pain    Dr. 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 No pertinent surgical history. Patient Active Problem List   Diagnosis Date Noted   Screening examination for STD (sexually transmitted disease) 05/30/2022   Herpes 05/30/2022   History of chlamydia 05/30/2022   Marijuana use 05/17/2021   Recurrent major depressive disorder (HCC) 05/17/2021   Unspecified mood (affective) disorder (HCC) 05/17/2021   Anxiety disorder, unspecified 05/17/2021   Intentional overdose of drug in tablet form (HCC) 05/16/2021   Pregnancy examination or test, negative  result 01/02/2021   Nexplanon in place 01/02/2021   Irregular intermenstrual bleeding 01/02/2021   Non-seasonal allergic rhinitis due to pollen 08/29/2020   Mild persistent asthma without complication 02/29/2020   Flat feet, bilateral 10/09/2018   MDD (major depressive disorder), recurrent severe, 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   Migraine without aura and without status migrainosus, not intractable 12/24/2016   Anxiety state 01/04/2015   Tension headache 01/04/2015   Contraceptive management 05-03-14   Vaginal ulceration 04/04/2014    PCP: [facility]  REFERRING PROVIDER: Levonne Lapping, NP  ONSET DATE: approximately 2 months ago  REFERRING DIAG:  S46.911A (ICD-10-CM) - Strain of unspecified muscle, fascia and tendon at shoulder and upper arm level, right arm, initial encounter  X50.3XXA (ICD-10-CM) - Overexertion from repetitive movements, initial encounter    THERAPY DIAG:  Acute pain of right shoulder  Other symptoms and signs involving the musculoskeletal system  Rationale for Evaluation and Treatment: Rehabilitation  SUBJECTIVE:   SUBJECTIVE STATEMENT: S: They said I strained it at work.  Pt accompanied by: self  PERTINENT HISTORY: Pt is a 22 y/o female who presents with right shoulder pain present for approximately 2 months. Approximately 1 month ago pt was put on a different line and felt a pop with pain, then went to get checked out. MD completed an x-ray which was negative for changes.  No MRI at this time.   PRECAUTIONS: Other: No lifting over 5# (no reaching overhead at home/work)  WEIGHT BEARING RESTRICTIONS: No  PAIN:  Are you having pain? Yes: NPRS scale: 7/10 Pain location: Top of right shoulder Pain description: sharp Aggravating factors: Movement Relieving factors: resting, muscle relaxers  FALLS: Has patient fallen in last 6 months? No  PLOF: Independent  PATIENT GOALS: To have less pain  NEXT MD VISIT: 07/15/23  OBJECTIVE:   HAND  DOMINANCE: Right  ADLs: Overall ADLs: Pt reports reaching overhead and reaching to the side are difficult. Pt has difficulty with lifting heavy objects over 10-15 pounds. Reaching behind the back is difficult. Sometimes has difficulty sleeping. Has good days and bad days with pain.   FUNCTIONAL OUTCOME MEASURES: Quick Dash: 75  UPPER EXTREMITY ROM:       Assessed in sitting, er/IR adducted  Active ROM Right eval  Shoulder flexion 122  Shoulder abduction 100  Shoulder internal rotation 90  Shoulder external rotation 41  (Blank rows = not tested)    P/ROM is full with good glenohumeral joint mobility.   UPPER EXTREMITY MMT:     Assessed in seated, er/IR adducted  MMT Right eval  Shoulder flexion 3-/5  Shoulder abduction 3-/5  Shoulder internal rotation 3/5  Shoulder external rotation 3/5  (Blank rows = not tested)  HAND FUNCTION: Grip strength: Right: 25 lbs; Left: 58 lbs  SENSATION: Mild numbness that comes and goes  COGNITION: Overall cognitive status: Within functional limits for tasks assessed  OBSERVATIONS: pt with moderate fascial restrictions along anterior shoulder, trapezius, and scapular regions   TODAY'S TREATMENT:                                                                                                                              DATE: N/A-eval only     PATIENT EDUCATION: Education details: AA/ROM Person educated: Patient Education method: Programmer, multimedia, Facilities manager, and Handouts Education comprehension: verbalized understanding and returned demonstration  HOME EXERCISE PROGRAM: Eval: AA/ROM  GOALS: Goals reviewed with patient? Yes   SHORT TERM GOALS: Target date: 08/13/23  Pt will be provided with and educated on HEP to improve mobility in RUE required for use during ADL completion.   Goal status: INITIAL  2.  Pt will increase RUE A/ROM by 30+ degrees to improve ability to use RUE during dressing and bathing tasks reaching  overhead and behind back.  Goal status: INITIAL  3.  Pt will increase RUE strength to 4+/5 to improve ability to reach and lift items over 10#.   Goal status: INITIAL  4.  Pt will decrease pain in RUE to 3/10 or less to improve ability to sleep for 2+ consecutive hours without waking due to pain.   Goal status: INITIAL  5. Pt will decrease RUE fascial restrictions to min amounts or less to improve mobility required for functional reaching tasks.   Goal status: INITIAL    ASSESSMENT:  CLINICAL IMPRESSION: Patient is a 22 y.o. female who was seen today for occupational therapy evaluation for right shoulder pain. Pt presents with increased pain and fascial restrictions, decreased ROM, strength, and functional use of the RUE. Pt with good joint mobility, but is pain limited with ROM and lifting tasks.   PERFORMANCE DEFICITS: in functional skills including in functional skills including ADLs, IADLs, coordination, tone, ROM, strength, pain, fascial restrictions, muscle spasms, and UE functional use  IMPAIRMENTS: are limiting patient from ADLs, IADLs, rest and sleep, work, and leisure.   COMORBIDITIES: has no other co-morbidities that affects occupational performance. Patient will benefit from skilled OT to address above impairments and improve overall function.  MODIFICATION OR ASSISTANCE TO COMPLETE EVALUATION: No modification of tasks or assist necessary to complete an evaluation.  OT OCCUPATIONAL PROFILE AND HISTORY: Problem focused assessment: Including review of records relating to presenting problem.  CLINICAL DECISION MAKING: LOW - limited treatment options, no task modification necessary  REHAB POTENTIAL: Good  EVALUATION COMPLEXITY: Low      PLAN:  OT FREQUENCY: 1x/week  OT DURATION: 4 weeks  PLANNED INTERVENTIONS: self care/ADL training, therapeutic exercise, therapeutic activity, neuromuscular re-education, manual therapy, passive range of motion, splinting,  electrical stimulation, ultrasound, moist heat, cryotherapy, patient/family education, and DME and/or AE instructions  RECOMMENDED OTHER SERVICES: None at this time  CONSULTED AND AGREED WITH PLAN OF CARE: Patient  PLAN FOR NEXT SESSION: Follow up on HEP, initiate manual techniques, A/ROM, scapular strengthening   Ezra Sites, OTR/L  [phone] 07/14/2023, 11:11 AM

## 2023-07-14 NOTE — Patient Instructions (Signed)

## 2023-07-22 ENCOUNTER — Encounter (HOSPITAL_COMMUNITY): Payer: Medicaid Other | Admitting: Occupational Therapy

## 2023-09-23 ENCOUNTER — Encounter (HOSPITAL_COMMUNITY): Payer: Self-pay | Admitting: Licensed Clinical Social Worker

## 2023-09-23 ENCOUNTER — Ambulatory Visit (INDEPENDENT_AMBULATORY_CARE_PROVIDER_SITE_OTHER): Payer: Medicaid Other | Admitting: Licensed Clinical Social Worker

## 2023-09-23 DIAGNOSIS — F411 Generalized anxiety disorder: Secondary | ICD-10-CM

## 2023-09-23 DIAGNOSIS — F331 Major depressive disorder, recurrent, moderate: Secondary | ICD-10-CM

## 2023-09-23 DIAGNOSIS — F902 Attention-deficit hyperactivity disorder, combined type: Secondary | ICD-10-CM
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
## 2023-10-08 ENCOUNTER — Other Ambulatory Visit: Payer: Self-pay

## 2023-10-08 ENCOUNTER — Emergency Department (HOSPITAL_COMMUNITY)
Admission: EM | Admit: 2023-10-08 | Discharge: 2023-10-09 | Discharge status: Against Medical Advice | Payer: Medicaid Other | Attending: Emergency Medicine | Admitting: Emergency Medicine

## 2023-10-08 DIAGNOSIS — R109 Unspecified abdominal pain: Secondary | ICD-10-CM | POA: Insufficient documentation

## 2023-10-08 DIAGNOSIS — K35209 Acute appendicitis with generalized peritonitis, without abscess, unspecified as to perforation: Secondary | ICD-10-CM | POA: Diagnosis not present

## 2023-10-08 DIAGNOSIS — F17210 Nicotine dependence, cigarettes, uncomplicated: Secondary | ICD-10-CM | POA: Diagnosis not present

## 2023-10-08 DIAGNOSIS — K529 Noninfective gastroenteritis and colitis, unspecified: Secondary | ICD-10-CM | POA: Diagnosis not present

## 2023-10-08 DIAGNOSIS — K219 Gastro-esophageal reflux disease without esophagitis: Secondary | ICD-10-CM | POA: Diagnosis not present

## 2023-10-08 DIAGNOSIS — J45909 Unspecified asthma, uncomplicated: Secondary | ICD-10-CM | POA: Diagnosis not present

## 2023-10-08 DIAGNOSIS — K352 Acute appendicitis with generalized peritonitis, without perforation or abscess: Secondary | ICD-10-CM | POA: Diagnosis present

## 2023-10-08 DIAGNOSIS — F1290 Cannabis use, unspecified, uncomplicated: Secondary | ICD-10-CM | POA: Diagnosis not present

## 2023-10-08 DIAGNOSIS — Z5321 Procedure and treatment not carried out due to patient leaving prior to being seen by health care provider: Secondary | ICD-10-CM | POA: Insufficient documentation

## 2023-10-08 DIAGNOSIS — I10 Essential (primary) hypertension: Secondary | ICD-10-CM | POA: Diagnosis not present

## 2023-10-08 LAB — CBC WITH DIFFERENTIAL/PLATELET
Abs Immature Granulocytes: 0.08 10*3/uL — ABNORMAL HIGH (ref 0.00–0.07)
Basophils Absolute: 0 10*3/uL (ref 0.0–0.1)
Basophils Relative: 0 %
Eosinophils Absolute: 0 10*3/uL (ref 0.0–0.5)
Eosinophils Relative: 0 %
HCT: 45.1 % (ref 36.0–46.0)
Hemoglobin: 14.6 g/dL (ref 12.0–15.0)
Immature Granulocytes: 1 %
Lymphocytes Relative: 6 %
Lymphs Abs: 1 10*3/uL (ref 0.7–4.0)
MCH: 30.2 pg (ref 26.0–34.0)
MCHC: 32.4 g/dL (ref 30.0–36.0)
MCV: 93.2 fL (ref 80.0–100.0)
Monocytes Absolute: 0.4 10*3/uL (ref 0.1–1.0)
Monocytes Relative: 3 %
Neutro Abs: 14.2 10*3/uL — ABNORMAL HIGH (ref 1.7–7.7)
Neutrophils Relative %: 90 %
Platelets: 198 10*3/uL (ref 150–400)
RBC: 4.84 MIL/uL (ref 3.87–5.11)
RDW: 12.5 % (ref 11.5–15.5)
WBC: 15.8 10*3/uL — ABNORMAL HIGH (ref 4.0–10.5)
nRBC: 0 % (ref 0.0–0.2)

## 2023-10-08 LAB — COMPREHENSIVE METABOLIC PANEL
ALT: 12 U/L (ref 0–44)
AST: 16 U/L (ref 15–41)
Albumin: 4.3 g/dL (ref 3.5–5.0)
Alkaline Phosphatase: 93 U/L (ref 38–126)
Anion gap: 10 (ref 5–15)
BUN: 9 mg/dL (ref 6–20)
CO2: 23 mmol/L (ref 22–32)
Calcium: 9.2 mg/dL (ref 8.9–10.3)
Chloride: 100 mmol/L (ref 98–111)
Creatinine, Ser: 0.8 mg/dL (ref 0.44–1.00)
GFR, Estimated: >60 mL/min (ref >60)
Glucose, Bld: 95 mg/dL (ref 70–99)
Potassium: 3.6 mmol/L (ref 3.5–5.1)
Sodium: 133 mmol/L — ABNORMAL LOW (ref 135–145)
Total Bilirubin: 0.6 mg/dL (ref <1.2)
Total Protein: 8.2 g/dL — ABNORMAL HIGH (ref 6.5–8.1)

## 2023-10-08 LAB — LIPASE, BLOOD: Lipase: 29 U/L (ref 11–51)

## 2023-10-08 LAB — HCG, SERUM, QUALITATIVE: Preg, Serum: NEGATIVE

## 2023-10-08 NOTE — ED Triage Notes (Signed)
Pt reports abdominal pain that began last Friday and then reports it went away and came back on Monday. Pt reports pain as cramping.

## 2023-10-09 ENCOUNTER — Ambulatory Visit (HOSPITAL_COMMUNITY)
Admission: EM | Admit: 2023-10-09 | Discharge: 2023-10-09 | Disposition: A | Discharge status: Home / Self Care | Payer: Medicaid Other | Attending: General Surgery | Admitting: General Surgery

## 2023-10-09 ENCOUNTER — Other Ambulatory Visit: Payer: Self-pay

## 2023-10-09 ENCOUNTER — Emergency Department (HOSPITAL_COMMUNITY): Payer: Medicaid Other

## 2023-10-09 ENCOUNTER — Emergency Department (HOSPITAL_BASED_OUTPATIENT_CLINIC_OR_DEPARTMENT_OTHER): Payer: Medicaid Other | Admitting: Certified Registered"

## 2023-10-09 ENCOUNTER — Encounter (HOSPITAL_COMMUNITY)
Admission: EM | Disposition: A | Discharge status: Home / Self Care | Payer: Self-pay | Source: Home / Self Care | Attending: Emergency Medicine

## 2023-10-09 ENCOUNTER — Emergency Department (HOSPITAL_COMMUNITY): Payer: Medicaid Other | Admitting: Certified Registered"

## 2023-10-09 ENCOUNTER — Encounter (HOSPITAL_COMMUNITY): Payer: Self-pay

## 2023-10-09 DIAGNOSIS — F1290 Cannabis use, unspecified, uncomplicated: Secondary | ICD-10-CM | POA: Insufficient documentation

## 2023-10-09 DIAGNOSIS — K529 Noninfective gastroenteritis and colitis, unspecified: Secondary | ICD-10-CM | POA: Insufficient documentation

## 2023-10-09 DIAGNOSIS — K353 Acute appendicitis with localized peritonitis, without perforation or gangrene: Secondary | ICD-10-CM

## 2023-10-09 DIAGNOSIS — I10 Essential (primary) hypertension: Secondary | ICD-10-CM | POA: Insufficient documentation

## 2023-10-09 DIAGNOSIS — K352 Acute appendicitis with generalized peritonitis, without perforation or abscess: Secondary | ICD-10-CM

## 2023-10-09 DIAGNOSIS — F17210 Nicotine dependence, cigarettes, uncomplicated: Secondary | ICD-10-CM | POA: Insufficient documentation

## 2023-10-09 DIAGNOSIS — K3580 Unspecified acute appendicitis: Secondary | ICD-10-CM | POA: Diagnosis not present

## 2023-10-09 DIAGNOSIS — J45909 Unspecified asthma, uncomplicated: Secondary | ICD-10-CM | POA: Insufficient documentation

## 2023-10-09 DIAGNOSIS — K35209 Acute appendicitis with generalized peritonitis, without abscess, unspecified as to perforation: Secondary | ICD-10-CM | POA: Insufficient documentation

## 2023-10-09 DIAGNOSIS — K219 Gastro-esophageal reflux disease without esophagitis: Secondary | ICD-10-CM | POA: Insufficient documentation

## 2023-10-09 HISTORY — DX: Acute appendicitis with localized peritonitis, without perforation or gangrene: K35.30

## 2023-10-09 HISTORY — PX: XI ROBOTIC LAPAROSCOPIC ASSISTED APPENDECTOMY: SHX6877

## 2023-10-09 LAB — CBC
HCT: 45.2 % (ref 36.0–46.0)
Hemoglobin: 14.6 g/dL (ref 12.0–15.0)
MCH: 30.4 pg (ref 26.0–34.0)
MCHC: 32.3 g/dL (ref 30.0–36.0)
MCV: 94 fL (ref 80.0–100.0)
Platelets: 189 10*3/uL (ref 150–400)
RBC: 4.81 MIL/uL (ref 3.87–5.11)
RDW: 12.7 % (ref 11.5–15.5)
WBC: 18.3 10*3/uL — ABNORMAL HIGH (ref 4.0–10.5)
nRBC: 0 % (ref 0.0–0.2)

## 2023-10-09 LAB — COMPREHENSIVE METABOLIC PANEL
ALT: 13 U/L (ref 0–44)
AST: 17 U/L (ref 15–41)
Albumin: 4.3 g/dL (ref 3.5–5.0)
Alkaline Phosphatase: 90 U/L (ref 38–126)
Anion gap: 15 (ref 5–15)
BUN: 10 mg/dL (ref 6–20)
CO2: 19 mmol/L — ABNORMAL LOW (ref 22–32)
Calcium: 9.5 mg/dL (ref 8.9–10.3)
Chloride: 99 mmol/L (ref 98–111)
Creatinine, Ser: 0.91 mg/dL (ref 0.44–1.00)
GFR, Estimated: >60 mL/min (ref >60)
Glucose, Bld: 139 mg/dL — ABNORMAL HIGH (ref 70–99)
Potassium: 3.6 mmol/L (ref 3.5–5.1)
Sodium: 133 mmol/L — ABNORMAL LOW (ref 135–145)
Total Bilirubin: 1.6 mg/dL — ABNORMAL HIGH (ref <1.2)
Total Protein: 8.4 g/dL — ABNORMAL HIGH (ref 6.5–8.1)

## 2023-10-09 LAB — HCG, QUANTITATIVE, PREGNANCY: hCG, Beta Chain, Quant, S: <1 m[IU]/mL (ref <5)

## 2023-10-09 LAB — URINALYSIS, ROUTINE W REFLEX MICROSCOPIC
Bacteria, UA: NONE SEEN
Bilirubin Urine: NEGATIVE
Glucose, UA: NEGATIVE mg/dL
Hgb urine dipstick: NEGATIVE
Ketones, ur: 20 mg/dL — AB
Leukocytes,Ua: NEGATIVE
Nitrite: NEGATIVE
Protein, ur: 30 mg/dL — AB
Specific Gravity, Urine: 1.045 — ABNORMAL HIGH (ref 1.005–1.030)
pH: 6 (ref 5.0–8.0)

## 2023-10-09 LAB — LIPASE, BLOOD: Lipase: 23 U/L (ref 11–51)

## 2023-10-09 SURGERY — APPENDECTOMY, ROBOT-ASSISTED, LAPAROSCOPIC
Anesthesia: General | Site: Abdomen

## 2023-10-09 MED ORDER — SODIUM CHLORIDE 0.9 % IV SOLN
2.0000 g | INTRAVENOUS | Status: AC
  Administered 2023-10-09: 2 g via INTRAVENOUS
  Filled 2023-10-09: qty 2

## 2023-10-09 MED ORDER — BUPIVACAINE HCL (PF) 0.5 % IJ SOLN
INTRAMUSCULAR | Status: AC
  Filled 2023-10-09: qty 30

## 2023-10-09 MED ORDER — OXYCODONE HCL 5 MG PO TABS: 5.0000 mg | ORAL_TABLET | Freq: Once | ORAL | Status: DC | PRN

## 2023-10-09 MED ORDER — OXYCODONE HCL 5 MG/5ML PO SOLN: 5.0000 mg | Freq: Once | ORAL | Status: DC | PRN

## 2023-10-09 MED ORDER — FENTANYL CITRATE (PF) 100 MCG/2ML IJ SOLN
INTRAMUSCULAR | Status: DC | PRN
  Administered 2023-10-09 (×2): 50 ug via INTRAVENOUS

## 2023-10-09 MED ORDER — SODIUM CHLORIDE 0.9 % IV SOLN
INTRAVENOUS | Status: AC
  Filled 2023-10-09: qty 2

## 2023-10-09 MED ORDER — FENTANYL CITRATE (PF) 100 MCG/2ML IJ SOLN
INTRAMUSCULAR | Status: AC
  Filled 2023-10-09: qty 2

## 2023-10-09 MED ORDER — LORAZEPAM 2 MG/ML IJ SOLN
0.5000 mg | Freq: Once | INTRAMUSCULAR | Status: AC
  Administered 2023-10-09: 0.5 mg via INTRAVENOUS
  Filled 2023-10-09: qty 1

## 2023-10-09 MED ORDER — ONDANSETRON HCL 4 MG/2ML IJ SOLN
4.0000 mg | Freq: Once | INTRAMUSCULAR | Status: AC | PRN
  Administered 2023-10-09: 4 mg via INTRAVENOUS
  Filled 2023-10-09: qty 2

## 2023-10-09 MED ORDER — HYDROMORPHONE HCL 1 MG/ML IJ SOLN
0.5000 mg | Freq: Once | INTRAMUSCULAR | Status: AC
  Administered 2023-10-09: 0.5 mg via INTRAVENOUS
  Filled 2023-10-09: qty 0.5

## 2023-10-09 MED ORDER — ROCURONIUM BROMIDE 10 MG/ML (PF) SYRINGE
PREFILLED_SYRINGE | INTRAVENOUS | Status: DC | PRN
  Administered 2023-10-09: 40 mg via INTRAVENOUS

## 2023-10-09 MED ORDER — ONDANSETRON HCL 4 MG/2ML IJ SOLN
INTRAMUSCULAR | Status: DC | PRN
  Administered 2023-10-09: 4 mg via INTRAVENOUS

## 2023-10-09 MED ORDER — ORAL CARE MOUTH RINSE: 15.0000 mL | Freq: Once | OROMUCOSAL | Status: DC

## 2023-10-09 MED ORDER — IOHEXOL 300 MG/ML  SOLN
100.0000 mL | Freq: Once | INTRAMUSCULAR | Status: AC | PRN
  Administered 2023-10-09: 100 mL via INTRAVENOUS

## 2023-10-09 MED ORDER — DEXAMETHASONE SODIUM PHOSPHATE 10 MG/ML IJ SOLN
INTRAMUSCULAR | Status: DC | PRN
  Administered 2023-10-09: 5 mg via INTRAVENOUS

## 2023-10-09 MED ORDER — PROPOFOL 10 MG/ML IV BOLUS
INTRAVENOUS | Status: DC | PRN
  Administered 2023-10-09: 200 mg via INTRAVENOUS

## 2023-10-09 MED ORDER — KETOROLAC TROMETHAMINE 30 MG/ML IJ SOLN
INTRAMUSCULAR | Status: AC
  Filled 2023-10-09: qty 1

## 2023-10-09 MED ORDER — MIDAZOLAM HCL 2 MG/2ML IJ SOLN
INTRAMUSCULAR | Status: AC
  Filled 2023-10-09: qty 2

## 2023-10-09 MED ORDER — PROPOFOL 10 MG/ML IV BOLUS
INTRAVENOUS | Status: AC
  Filled 2023-10-09: qty 40

## 2023-10-09 MED ORDER — OXYCODONE HCL 5 MG PO TABS: 5.0000 mg | ORAL_TABLET | ORAL | 0 refills | Status: DC | PRN

## 2023-10-09 MED ORDER — SUGAMMADEX SODIUM 200 MG/2ML IV SOLN
INTRAVENOUS | Status: DC | PRN
  Administered 2023-10-09: 200 mg via INTRAVENOUS

## 2023-10-09 MED ORDER — LACTATED RINGERS IV SOLN: INTRAVENOUS | Status: DC | PRN

## 2023-10-09 MED ORDER — ONDANSETRON HCL 4 MG/2ML IJ SOLN: 4.0000 mg | Freq: Once | INTRAMUSCULAR | Status: DC | PRN

## 2023-10-09 MED ORDER — ROCURONIUM BROMIDE 10 MG/ML (PF) SYRINGE
PREFILLED_SYRINGE | INTRAVENOUS | Status: AC
  Filled 2023-10-09: qty 10

## 2023-10-09 MED ORDER — MIDAZOLAM HCL 2 MG/2ML IJ SOLN
INTRAMUSCULAR | Status: DC | PRN
  Administered 2023-10-09: 2 mg via INTRAVENOUS

## 2023-10-09 MED ORDER — SODIUM CHLORIDE 0.9 % IV BOLUS
1000.0000 mL | Freq: Once | INTRAVENOUS | Status: AC
  Administered 2023-10-09: 1000 mL via INTRAVENOUS

## 2023-10-09 MED ORDER — CHLORHEXIDINE GLUCONATE CLOTH 2 % EX PADS: 6.0000 | MEDICATED_PAD | Freq: Once | CUTANEOUS | Status: DC

## 2023-10-09 MED ORDER — SUCCINYLCHOLINE CHLORIDE 200 MG/10ML IV SOSY
PREFILLED_SYRINGE | INTRAVENOUS | Status: DC | PRN
  Administered 2023-10-09: 100 mg via INTRAVENOUS

## 2023-10-09 MED ORDER — FENTANYL CITRATE PF 50 MCG/ML IJ SOSY
25.0000 ug | PREFILLED_SYRINGE | INTRAMUSCULAR | Status: DC | PRN
Start: 2023-10-09 — End: 2023-10-09

## 2023-10-09 MED ORDER — DEXMEDETOMIDINE HCL IN NACL 80 MCG/20ML IV SOLN
INTRAVENOUS | Status: DC | PRN
  Administered 2023-10-09 (×2): 8 ug via INTRAVENOUS

## 2023-10-09 MED ORDER — CHLORHEXIDINE GLUCONATE 0.12 % MT SOLN: 15.0000 mL | Freq: Once | OROMUCOSAL | Status: DC

## 2023-10-09 MED ORDER — BUPIVACAINE HCL (PF) 0.5 % IJ SOLN
INTRAMUSCULAR | Status: DC | PRN
  Administered 2023-10-09: 30 mL

## 2023-10-09 MED ORDER — DEXMEDETOMIDINE HCL IN NACL 80 MCG/20ML IV SOLN
INTRAVENOUS | Status: AC
  Filled 2023-10-09: qty 20

## 2023-10-09 MED ORDER — LACTATED RINGERS IV SOLN: INTRAVENOUS | Status: DC

## 2023-10-09 MED ORDER — KETOROLAC TROMETHAMINE 30 MG/ML IJ SOLN
INTRAMUSCULAR | Status: DC | PRN
  Administered 2023-10-09: 30 mg via INTRAVENOUS

## 2023-10-09 MED ORDER — STERILE WATER FOR IRRIGATION IR SOLN
Status: DC | PRN
  Administered 2023-10-09: 500 mL

## 2023-10-09 MED ORDER — CHLORHEXIDINE GLUCONATE 0.12 % MT SOLN
OROMUCOSAL | Status: AC
  Filled 2023-10-09: qty 15

## 2023-10-09 SURGICAL SUPPLY — 46 items
CANNULA REDUCER 12-8 DVNC XI (CANNULA) ×2 IMPLANT
CHLORAPREP W/TINT 26 (MISCELLANEOUS) ×2 IMPLANT
COVER MAYO STAND XLG (MISCELLANEOUS) ×2 IMPLANT
DERMABOND ADVANCED .7 DNX12 (GAUZE/BANDAGES/DRESSINGS) ×2 IMPLANT
DRAPE ARM DVNC X/XI (DISPOSABLE) ×6 IMPLANT
DRAPE COLUMN DVNC XI (DISPOSABLE) ×2 IMPLANT
ELECT REM PT RETURN 9FT ADLT (ELECTROSURGICAL) ×1
ELECTRODE REM PT RTRN 9FT ADLT (ELECTROSURGICAL) ×2 IMPLANT
FORCEPS BPLR 8 MD DVNC XI (FORCEP) IMPLANT
FORCEPS BPLR FENES DVNC XI (FORCEP) IMPLANT
FORCEPS BPLR R/ABLATION 8 DVNC (INSTRUMENTS) ×2 IMPLANT
GAUZE 4X4 16PLY ~~LOC~~+RFID DBL (SPONGE) ×2 IMPLANT
GLOVE BIOGEL PI IND STRL 7.0 (GLOVE) ×4 IMPLANT
GLOVE SURG SS PI 7.0 STRL IVOR (GLOVE) IMPLANT
GLOVE SURG SS PI 7.5 STRL IVOR (GLOVE) ×4 IMPLANT
GOWN STRL REUS W/TWL LRG LVL3 (GOWN DISPOSABLE) ×6 IMPLANT
KIT PINK PAD W/HEAD ARE REST (MISCELLANEOUS) ×1 IMPLANT
KIT PINK PAD W/HEAD ARM REST (MISCELLANEOUS) ×2 IMPLANT
KIT TURNOVER KIT A (KITS) ×2 IMPLANT
MANIFOLD NEPTUNE II (INSTRUMENTS) ×2 IMPLANT
NDL HYPO 21X1.5 SAFETY (NEEDLE) ×2 IMPLANT
NDL INSUFFLATION 14GA 120MM (NEEDLE) ×2 IMPLANT
NEEDLE HYPO 21X1.5 SAFETY (NEEDLE) ×1 IMPLANT
NEEDLE INSUFFLATION 14GA 120MM (NEEDLE) ×1 IMPLANT
NS IRRIG 500ML POUR BTL (IV SOLUTION) ×2 IMPLANT
OBTURATOR OPTICAL STND 8 DVNC (TROCAR) ×1
OBTURATOR OPTICALSTD 8 DVNC (TROCAR) ×2 IMPLANT
PACK LAP CHOLE LZT030E (CUSTOM PROCEDURE TRAY) ×2 IMPLANT
PAD ARMBOARD 7.5X6 YLW CONV (MISCELLANEOUS) ×2 IMPLANT
PENCIL HANDSWITCHING (ELECTRODE) ×2 IMPLANT
POSITIONER HEAD 8X9X4 ADT (SOFTGOODS) ×2 IMPLANT
RELOAD STAPLE 45 2.5 WHT DVNC (STAPLE) IMPLANT
RELOAD STAPLER 2.5X45 WHT DVNC (STAPLE) ×1 IMPLANT
SEAL UNIV 5-12 XI (MISCELLANEOUS) ×6 IMPLANT
SEALER VESSEL EXT DVNC XI (MISCELLANEOUS) IMPLANT
SET TUBE DA VINCI INSUFFLATOR (TUBING) IMPLANT
SET TUBE SMOKE EVAC HIGH FLOW (TUBING) IMPLANT
STAPLER 45 SUREFORM DVNC (STAPLE) ×2 IMPLANT
STAPLER RELOAD 2.5X45 WHT DVNC (STAPLE) ×1
SUT MNCRL AB 4-0 PS2 18 (SUTURE) ×2 IMPLANT
SUT VICRYL 0 UR6 27IN ABS (SUTURE) IMPLANT
SYR 30ML LL (SYRINGE) ×2 IMPLANT
SYS BAG RETRIEVAL 10MM (BASKET) ×1
SYSTEM BAG RETRIEVAL 10MM (BASKET) ×2 IMPLANT
TAPE TRANSPORE STRL 2 31045 (GAUZE/BANDAGES/DRESSINGS) ×2 IMPLANT
WATER STERILE IRR 500ML POUR (IV SOLUTION) ×2 IMPLANT

## 2023-10-09 NOTE — ED Provider Notes (Signed)
[place] EMERGENCY DEPARTMENT AT [facility] Provider Note   CSN: [id number] Arrival date & time: 10/09/23  4403     History  Chief Complaint  Patient presents with   Abdominal Pain    Taylor Jefferson is a 22 y.o. female.  Patient complains of lower abdominal pain since yesterday.  Patient has no medical problems  The history is provided by the patient and medical records. No language interpreter was used.  Abdominal Pain Pain location:  Generalized Pain quality: aching   Pain radiates to:  Does not radiate Pain severity:  Severe Onset quality:  Sudden Timing:  Constant Progression:  Worsening Chronicity:  New Context: not alcohol use   Relieved by:  Nothing Worsened by:  Nothing Ineffective treatments:  None tried Associated symptoms: no chest pain, no cough, no diarrhea, no fatigue and no hematuria        Home Medications Prior to Admission medications   Not on File      Allergies    Other    Review of Systems   Review of Systems  Constitutional:  Negative for appetite change and fatigue.  HENT:  Negative for congestion, ear discharge and sinus pressure.   Eyes:  Negative for discharge.  Respiratory:  Negative for cough.   Cardiovascular:  Negative for chest pain.  Gastrointestinal:  Positive for abdominal pain. Negative for diarrhea.  Genitourinary:  Negative for frequency and hematuria.  Musculoskeletal:  Negative for back pain.  Skin:  Negative for rash.  Neurological:  Negative for seizures and headaches.  Psychiatric/Behavioral:  Negative for hallucinations.     Physical Exam Updated Vital Signs BP 129/85   Pulse (!) 120   Temp 98.5 F (36.9 C) (Oral)   Resp (!) 22   Ht 5\' 2"  (1.575 m)   Wt 54.4 kg   LMP 09/21/2023 (Approximate)   SpO2 97%   BMI 21.95 kg/m  Physical Exam Vitals and nursing note reviewed.  Constitutional:      Appearance: She is well-developed.  HENT:     Head: Normocephalic.     Nose: Nose normal.   Eyes:     General: No scleral icterus.    Conjunctiva/sclera: Conjunctivae normal.  Neck:     Thyroid: No thyromegaly.  Cardiovascular:     Rate and Rhythm: Normal rate and regular rhythm.     Heart sounds: No murmur heard.    No friction rub. No gallop.  Pulmonary:     Breath sounds: No stridor. No wheezing or rales.  Chest:     Chest wall: No tenderness.  Abdominal:     General: There is no distension.     Tenderness: There is abdominal tenderness. There is rebound.  Musculoskeletal:        General: Normal range of motion.     Cervical back: Neck supple.  Lymphadenopathy:     Cervical: No cervical adenopathy.  Skin:    Findings: No erythema or rash.  Neurological:     Mental Status: She is alert and oriented to person, place, and time.     Motor: No abnormal muscle tone.     Coordination: Coordination normal.  Psychiatric:        Behavior: Behavior normal.     ED Results / Procedures / Treatments   Labs (all labs ordered are listed, but only abnormal results are displayed) Labs Reviewed  COMPREHENSIVE METABOLIC PANEL - Abnormal; Notable for the following components:      Result Value  Sodium 133 (*)    CO2 19 (*)    Glucose, Bld 139 (*)    Total Protein 8.4 (*)    Total Bilirubin 1.6 (*)    All other components within normal limits  CBC - Abnormal; Notable for the following components:   WBC 18.3 (*)    All other components within normal limits  URINALYSIS, ROUTINE W REFLEX MICROSCOPIC - Abnormal; Notable for the following components:   Specific Gravity, Urine 1.045 (*)    Ketones, ur 20 (*)    Protein, ur 30 (*)    All other components within normal limits  LIPASE, BLOOD  HCG, QUANTITATIVE, PREGNANCY    EKG None  Radiology CT ABDOMEN PELVIS W CONTRAST Result Date: 10/09/2023 CLINICAL DATA:  Acute generalized abdominal pain. EXAM: CT ABDOMEN AND PELVIS WITH CONTRAST TECHNIQUE: Multidetector CT imaging of the abdomen and pelvis was performed using the  standard protocol following bolus administration of intravenous contrast. RADIATION DOSE REDUCTION: This exam was performed according to the departmental dose-optimization program which includes automated exposure control, adjustment of the mA and/or kV according to patient size and/or use of iterative reconstruction technique. CONTRAST:  OMNIPAQUE IOHEXOL 300 MG/ML  SOLN COMPARISON:  July 12, 2020. FINDINGS: Lower chest: No acute abnormality. Hepatobiliary: No focal liver abnormality is seen. No gallstones, gallbladder wall thickening, or biliary dilatation. Pancreas: Unremarkable. No pancreatic ductal dilatation or surrounding inflammatory changes. Spleen: Normal in size without focal abnormality. Adrenals/Urinary Tract: Adrenal glands are unremarkable. Kidneys are normal, without renal calculi, focal lesion, or hydronephrosis. Bladder is unremarkable. Stomach/Bowel: The stomach is unremarkable. No colonic dilatation is noted. Mild wall thickening of small bowel loops are noted in the pelvis suggesting some degree of enteritis with minimal surrounding inflammatory changes. The appendix appears to be mildly thickened at 9 mm. Vascular/Lymphatic: No significant vascular findings are present. No enlarged abdominal or pelvic lymph nodes. Reproductive: Uterus and bilateral adnexa are unremarkable. Other: No abdominal wall hernia or abnormality. No abdominopelvic ascites. Musculoskeletal: No acute or significant osseous findings. IMPRESSION: The appendix appears to be mildly thickened at 9 mm. There appears to be mild wall thickening of small bowel loops in the pelvis suggesting some degree of enteritis or secondary inflammation, with minimal surrounding inflammatory changes. These findings are equivocal for acute appendicitis. Correlation with clinical and laboratory data is recommended. Electronically Signed   By: Lupita Raider M.D.   On: 10/09/2023 12:26    Procedures Procedures    Medications  Ordered in ED Medications  Chlorhexidine Gluconate Cloth 2 % PADS 6 each (has no administration in time range)    And  Chlorhexidine Gluconate Cloth 2 % PADS 6 each (has no administration in time range)  cefoTEtan (CEFOTAN) 2 g in sodium chloride 0.9 % 100 mL IVPB (has no administration in time range)  chlorhexidine (PERIDEX) 0.12 % solution (has no administration in time range)  sodium chloride 0.9 % with cefoTEtan (CEFOTAN) ADS Med (has no administration in time range)  sodium chloride 0.9 % with cefoTEtan (CEFOTAN) ADS Med (has no administration in time range)  ondansetron (ZOFRAN) injection 4 mg (4 mg Intravenous Given 10/09/23 0840)  HYDROmorphone (DILAUDID) injection 0.5 mg (0.5 mg Intravenous Given 10/09/23 0841)  LORazepam (ATIVAN) injection 0.5 mg (0.5 mg Intravenous Given 10/09/23 0837)  sodium chloride 0.9 % bolus 1,000 mL (0 mLs Intravenous Stopped 10/09/23 1027)  iohexol (OMNIPAQUE) 300 MG/ML solution 100 mL (100 mLs Intravenous Contrast Given 10/09/23 1008)    ED Course/ Medical Decision Making/  A&P                                 Medical Decision Making Amount and/or Complexity of Data Reviewed Labs: ordered. Radiology: ordered.  Risk Prescription drug management. Decision regarding hospitalization.   Patient with abdominal pain and probable appendicitis.  Patient was seen by general surgery and she will go to the operating room to have her appendix removed        Final Clinical Impression(s) / ED Diagnoses Final diagnoses:  Acute appendicitis with generalized peritonitis without gangrene, perforation, or abscess    Rx / DC Orders ED Discharge Orders     None         Bethann Berkshire, MD 10/10/23 1746

## 2023-10-09 NOTE — ED Triage Notes (Signed)
Pt c/o abdominal pain that began Gri since eqating peanuts. Pt stated pain was bearable until last night. Pt reports she had a bm yesterday. No n/v but some diarrhea yesterday.

## 2023-10-09 NOTE — Anesthesia Procedure Notes (Addendum)
Procedure Name: Intubation Date/Time: 10/09/2023 2:24 PM  Performed by: Julian Reil, CRNAPre-anesthesia Checklist: Patient identified, Emergency Drugs available, Suction available and Patient being monitored Patient Re-evaluated:Patient Re-evaluated prior to induction Oxygen Delivery Method: Circle system utilized Preoxygenation: Pre-oxygenation with 100% oxygen Induction Type: IV induction, Rapid sequence and Cricoid Pressure applied Laryngoscope Size: Glidescope and 3 Grade View: Grade I Tube type: Oral Tube size: 7.0 mm Number of attempts: 1 Airway Equipment and Method: Stylet and Video-laryngoscopy Placement Confirmation: ETT inserted through vocal cords under direct vision, positive ETCO2 and breath sounds checked- equal and bilateral Secured at: 22 cm Tube secured with: Tape Dental Injury: Teeth and Oropharynx as per pre-operative assessment  Comments: DL per Dr Jarvis Newcomer MD intern per request.

## 2023-10-09 NOTE — ED Notes (Signed)
Pt. Called to triage for vitals, no answer.

## 2023-10-09 NOTE — Anesthesia Preprocedure Evaluation (Signed)
Anesthesia Evaluation  Patient identified by MRN, date of birth, ID band Patient awake    Reviewed: Allergy & Precautions, H&P , NPO status , Patient's Chart, lab work & pertinent test results, reviewed documented beta blocker date and time   Airway Mallampati: II  TM Distance: >3 FB Neck ROM: full    Dental no notable dental hx.    Pulmonary neg pulmonary ROS, Current Smoker   Pulmonary exam normal breath sounds clear to auscultation       Cardiovascular Exercise Tolerance: Good hypertension, negative cardio ROS  Rhythm:regular Rate:Normal     Neuro/Psych negative neurological ROS  negative psych ROS   GI/Hepatic negative GI ROS, Neg liver ROS,,,  Endo/Other  negative endocrine ROS    Renal/GU negative Renal ROS  negative genitourinary   Musculoskeletal   Abdominal   Peds  Hematology negative hematology ROS (+)   Anesthesia Other Findings   Reproductive/Obstetrics negative OB ROS                             Anesthesia Physical Anesthesia Plan  ASA: 2 and emergent  Anesthesia Plan: General and General ETT   Post-op Pain Management:    Induction:   PONV Risk Score and Plan: Ondansetron  Airway Management Planned:   Additional Equipment:   Intra-op Plan:   Post-operative Plan:   Informed Consent: I have reviewed the patients History and Physical, chart, labs and discussed the procedure including the risks, benefits and alternatives for the proposed anesthesia with the patient or authorized representative who has indicated his/her understanding and acceptance.     Dental Advisory Given  Plan Discussed with: CRNA  Anesthesia Plan Comments:        Anesthesia Quick Evaluation

## 2023-10-09 NOTE — ED Notes (Signed)
Patient transported to CT 

## 2023-10-09 NOTE — Op Note (Addendum)
Patient:  Taylor Jefferson  DOB:  05/09/2001  MRN:  [id number]   Preop Diagnosis: Acute appendicitis  Postop Diagnosis: Same  Procedure: Robotic assisted laparoscopic appendectomy  Surgeon: Franky Macho, MD  Anes: General Endotracheal  Indications: Patient is a 22 year old black female who presents to the emergency room with lower abdominal pain.  CT scan of the abdomen pelvis reveals a dilated and inflamed appendix.  The patient now presents for robotic assisted laparoscopic appendectomy.  The risks and benefits of the procedure including bleeding, infection, and the possibility of an open procedure were fully explained to the patient, who gave informed consent.  Procedure note: The patient was placed in the supine position.  After induction of general endotracheal anesthesia, the abdomen was prepped and draped using the usual sterile technique with ChloraPrep.  Surgical site confirmation was performed.  An incision was made in the left upper quadrant at Palmer's point.  A Veress needle was introduced into the abdominal cavity and confirmation of placement was done using the saline drop test.  The abdomen was then insufflated to 15 mmHg pressure.  An 8 mm trocar was introduced into the abdominal cavity under direct visualization without difficulty.  Additional 8 mm trocars were placed in the left flank and left lower quadrant regions.  The left upper quadrant trocar was then exchanged to a 12 mm trocar.  The robot was then docked and targeted.  I inspected the right lower quadrant.  There was surrounding small bowel loops and terminal ileum that had some inflammatory changes on the surface.  The appendix was inflamed.  The mesoappendix was divided using the vessel sealer.  Once the juncture of the appendix to the cecum was found, a white load Endo GIA stapler was placed across the base the appendix and fired.  The staple line was inspected and noted within normal limits.  The appendix was then  removed using an Endo Catch bag.  The robot was undocked and all air was evacuated from the abdominal cavity prior to removal of the trocars.  All wounds were irrigated with normal saline.  All wounds were injected with 0.5% Sensorcaine.  The left upper quadrant fascia was closed using an 0 Vicryl suture.  All incisions were closed using a 4-0 Monocryl subcuticular suture.  Dermabond was applied.  All tape and needle counts were correct at the end of the procedure.  The patient was extubated in the operating room and transferred to PACU in stable condition.  Complications: None  EBL: Minimal  Specimen: Appendix

## 2023-10-09 NOTE — H&P (Signed)
Taylor Jefferson is an 22 y.o. female.   Chief Complaint: Abdominal pain HPI: Patient is a 22 year old black female who presented to the emergency room with worsening lower abdominal pain.  She states it started 5 days ago but then resolved after 2 days.  It came back and is now become more prominent in the right lower quadrant.  She denies any fever, chills, or diarrhea.  She has not eaten anything since yesterday.  A CT scan of the abdomen pelvis reveals dilatation of the appendix with mild wall thickness.  She also has surrounding inflammation of the small bowel.  Family denies any history of inflammatory bowel disease.  Past Medical History:  Diagnosis Date   ADD (attention deficit disorder)    Allergy    Amenorrhea 11/02/2014   Anxiety    Asthma    Chlamydia 09/27/2019   tx 12/7, POC________   Contraceptive education 04/04/2014   Contraceptive management 05-11-2014   Death of parent 09/02/2020   Depression    HA (headache)    History of chlamydia 10/25/2019   Irregular periods 03/17/2019   Patellar pain    Dr. 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  Family History  Problem Relation Age of Onset   Depression Mother    HIV Mother    Other Mother        spinal stenosis   Glaucoma Mother    Migraines Mother    Heart disease Father    Hypertension Maternal Grandmother    Arthritis Maternal Grandmother        rheumatoid   Other Maternal Grandmother        acid reflux   Migraines Maternal Grandmother    Depression Maternal Grandmother    Hyperlipidemia Maternal Grandfather    ADD / ADHD Sister    ADD / ADHD Brother    Social History:  reports that she has  been smoking cigarettes. She has been exposed to tobacco smoke. She has never used smokeless tobacco. She reports current alcohol use. She reports current drug use. Drug: Marijuana.  Allergies:  Allergies  Allergen Reactions   Other Other (See Comments)    Seasonal - runny nose, itchy eyes    (Not in a hospital admission)   Results for orders placed or performed during the hospital encounter of 10/09/23 (from the past 48 hours)  Lipase, blood     Status: None   Collection Time: 10/09/23  8:35 AM  Result Value Ref Range   Lipase 23 11 - 51 U/L    Comment: Performed at [facility], [address] [place], [place] [address]  Comprehensive metabolic panel     Status: Abnormal   Collection Time: 10/09/23  8:35 AM  Result Value Ref Range   Sodium 133 (L) 135 - 145 mmol/L   Potassium 3.6 3.5 - 5.1 mmol/L   Chloride 99 98 - 111 mmol/L   CO2 19 (L) 22 - 32 mmol/L   Glucose, Bld 139 (H) 70 - 99 mg/dL    Comment: Glucose reference range applies only to samples taken after fasting for at least 8 hours.   BUN  10 6 - 20 mg/dL   Creatinine, Ser 0.98 0.44 - 1.00 mg/dL   Calcium 9.5 8.9 - 11.9 mg/dL   Total Protein 8.4 (H) 6.5 - 8.1 g/dL   Albumin 4.3 3.5 - 5.0 g/dL   AST 17 15 - 41 U/L   ALT 13 0 - 44 U/L   Alkaline Phosphatase 90 38 - 126 U/L   Total Bilirubin 1.6 (H) <1.2 mg/dL   GFR, Estimated >14 >78 mL/min    Comment: (NOTE) Calculated using the CKD-EPI Creatinine Equation (2021)    Anion gap 15 5 - 15    Comment: Performed at [facility], [address], [place], [place] [address]  CBC     Status: Abnormal   Collection Time: 10/09/23  8:35 AM  Result Value Ref Range   WBC 18.3 (H) 4.0 - 10.5 K/uL   RBC 4.81 3.87 - 5.11 MIL/uL   Hemoglobin 14.6 12.0 - 15.0 g/dL   HCT 13.0 86.5 - 78.4 %   MCV 94.0 80.0 - 100.0 fL   MCH 30.4 26.0 - 34.0 pg   MCHC 32.3 30.0 - 36.0 g/dL   RDW 69.6 29.5 - 28.4 %   Platelets 189 150 - 400 K/uL   nRBC 0.0 0.0 - 0.2 %    Comment: Performed at  [facility], [address], [place], [place] [address]  hCG, quantitative, pregnancy     Status: None   Collection Time: 10/09/23  8:35 AM  Result Value Ref Range   hCG, Beta Chain, Quant, S <1 <5 mIU/mL    Comment:          GEST. AGE      CONC.  (mIU/mL)   <=1 WEEK        5 - 50     2 WEEKS       50 - 500     3 WEEKS       100 - 10,000     4 WEEKS     1,000 - 30,000     5 WEEKS     3,500 - 115,000   6-8 WEEKS     12,000 - 270,000    12 WEEKS     15,000 - 220,000        FEMALE AND NON-PREGNANT FEMALE:     LESS THAN 5 mIU/mL Performed at [facility], [address], [place], [place] [address]   Urinalysis, Routine w reflex microscopic -Urine, Clean Catch     Status: Abnormal   Collection Time: 10/09/23 10:30 AM  Result Value Ref Range   Color, Urine YELLOW YELLOW   APPearance CLEAR CLEAR   Specific Gravity, Urine 1.045 (H) 1.005 - 1.030   pH 6.0 5.0 - 8.0   Glucose, UA NEGATIVE NEGATIVE mg/dL   Hgb urine dipstick NEGATIVE NEGATIVE   Bilirubin Urine NEGATIVE NEGATIVE   Ketones, ur 20 (A) NEGATIVE mg/dL   Protein, ur 30 (A) NEGATIVE mg/dL   Nitrite NEGATIVE NEGATIVE   Leukocytes,Ua NEGATIVE NEGATIVE   RBC / HPF 0-5 0 - 5 RBC/hpf   WBC, UA 0-5 0 - 5 WBC/hpf   Bacteria, UA NONE SEEN NONE SEEN   Squamous Epithelial / HPF 0-5 0 - 5 /HPF   Mucus PRESENT     Comment: Performed at [facility], [address] [place], [place] [address]   CT ABDOMEN PELVIS W CONTRAST Result Date: 10/09/2023 CLINICAL DATA:  Acute generalized abdominal pain. EXAM: CT ABDOMEN AND PELVIS WITH CONTRAST TECHNIQUE: Multidetector CT  imaging of the abdomen and pelvis was performed using the standard protocol following bolus administration of intravenous contrast. RADIATION DOSE REDUCTION: This exam was performed according to the departmental dose-optimization program which includes automated exposure control, adjustment of the mA and/or kV according to patient size and/or use of iterative reconstruction  technique. CONTRAST:  OMNIPAQUE IOHEXOL 300 MG/ML  SOLN COMPARISON:  July 12, 2020. FINDINGS: Lower chest: No acute abnormality. Hepatobiliary: No focal liver abnormality is seen. No gallstones, gallbladder wall thickening, or biliary dilatation. Pancreas: Unremarkable. No pancreatic ductal dilatation or surrounding inflammatory changes. Spleen: Normal in size without focal abnormality. Adrenals/Urinary Tract: Adrenal glands are unremarkable. Kidneys are normal, without renal calculi, focal lesion, or hydronephrosis. Bladder is unremarkable. Stomach/Bowel: The stomach is unremarkable. No colonic dilatation is noted. Mild wall thickening of small bowel loops are noted in the pelvis suggesting some degree of enteritis with minimal surrounding inflammatory changes. The appendix appears to be mildly thickened at 9 mm. Vascular/Lymphatic: No significant vascular findings are present. No enlarged abdominal or pelvic lymph nodes. Reproductive: Uterus and bilateral adnexa are unremarkable. Other: No abdominal wall hernia or abnormality. No abdominopelvic ascites. Musculoskeletal: No acute or significant osseous findings. IMPRESSION: The appendix appears to be mildly thickened at 9 mm. There appears to be mild wall thickening of small bowel loops in the pelvis suggesting some degree of enteritis or secondary inflammation, with minimal surrounding inflammatory changes. These findings are equivocal for acute appendicitis. Correlation with clinical and laboratory data is recommended. Electronically Signed   By: Lupita Raider M.D.   On: 10/09/2023 12:26    Review of Systems  Constitutional:  Positive for fatigue.  HENT: Negative.    Eyes: Negative.   Respiratory: Negative.    Cardiovascular: Negative.   Gastrointestinal:  Positive for abdominal pain. Negative for diarrhea.  Endocrine: Negative.   Genitourinary: Negative.   Musculoskeletal: Negative.   Allergic/Immunologic: Negative.   Neurological:  Negative.   Hematological: Negative.   Psychiatric/Behavioral: Negative.      Blood pressure 119/77, pulse (!) 119, temperature 98.5 F (36.9 C), temperature source Oral, resp. rate (!) 22, height 5\' 2"  (1.575 m), weight 54.4 kg, last menstrual period 09/21/2023, SpO2 95%. Physical Exam Vitals reviewed.  Constitutional:      Appearance: She is well-developed and normal weight. She is not ill-appearing.  HENT:     Head: Normocephalic and atraumatic.  Cardiovascular:     Rate and Rhythm: Normal rate and regular rhythm.     Heart sounds: Normal heart sounds. No murmur heard.    No friction rub. No gallop.  Pulmonary:     Effort: Pulmonary effort is normal. No respiratory distress.     Breath sounds: Normal breath sounds. No stridor. No wheezing, rhonchi or rales.  Abdominal:     General: Abdomen is flat. Bowel sounds are normal.     Palpations: Abdomen is soft.     Tenderness: There is abdominal tenderness in the right lower quadrant and periumbilical area. There is guarding. Positive signs include McBurney's sign.     Hernia: No hernia is present.  Skin:    General: Skin is warm and dry.  Neurological:     Mental Status: She is alert and oriented to person, place, and time.     CT Scan report reviewed Assessment/Plan Impression: Acute appendicitis with surrounding enteritis Plan: Will proceed with robotic assisted laparoscopic appendectomy.  The risks and benefits of the procedure including bleeding, infection, and the possibility of an open procedure were  fully explained to the patient, who gave informed consent.  Franky Macho, MD 10/09/2023, 1:24 PM

## 2023-10-09 NOTE — ED Notes (Signed)
Pt requesting fluids prior to I&O cath, MD aware

## 2023-10-09 NOTE — Transfer of Care (Signed)
Immediate Anesthesia Transfer of Care Note  Patient: Taylor Jefferson  Procedure(s) Performed: XI ROBOTIC LAPAROSCOPIC ASSISTED APPENDECTOMY (Abdomen)  Patient Location: PACU  Anesthesia Type:General  Level of Consciousness: awake, alert , and oriented  Airway & Oxygen Therapy: Patient Spontanous Breathing and Patient connected to face mask oxygen  Post-op Assessment: Report given to RN and Post -op Vital signs reviewed and stable  Post vital signs: Reviewed and stable  Last Vitals:  Vitals Value Taken Time  BP 113/71   Temp 98.4   Pulse 111 10/09/23 1517  Resp 24   SpO2 99% 10/09/23 1517  Vitals shown include unfiled device data.  Last Pain:  Vitals:   10/09/23 1406  TempSrc: Oral  PainSc: 7          Complications: No notable events documented.

## 2023-10-09 NOTE — Interval H&P Note (Signed)
History and Physical Interval Note:  10/09/2023 1:44 PM  Taylor Jefferson  has presented today for surgery, with the diagnosis of acute appendicitis.  The various methods of treatment have been discussed with the patient and family. After consideration of risks, benefits and other options for treatment, the patient has consented to  Procedure(s): XI ROBOTIC LAPAROSCOPIC ASSISTED APPENDECTOMY (N/A) as a surgical intervention.  The patient's history has been reviewed, patient examined, no change in status, stable for surgery.  I have reviewed the patient's chart and labs.  Questions were answered to the patient's satisfaction.     Franky Macho

## 2023-10-09 NOTE — Progress Notes (Signed)
Patient has a "smiley" in her mouth that is non removable.  Patient aware of risks.

## 2023-10-09 NOTE — Progress Notes (Signed)
Patient has nipple rings; unable to remove them.  Explained the risks during surgery and patient agreed to continue with the surgery.

## 2023-10-10 ENCOUNTER — Encounter (HOSPITAL_COMMUNITY): Payer: Self-pay | Admitting: General Surgery

## 2023-10-13 ENCOUNTER — Other Ambulatory Visit (INDEPENDENT_AMBULATORY_CARE_PROVIDER_SITE_OTHER): Payer: Medicaid Other | Admitting: General Surgery

## 2023-10-13 ENCOUNTER — Telehealth: Payer: Self-pay | Admitting: *Deleted

## 2023-10-13 DIAGNOSIS — Z09 Encounter for follow-up examination after completed treatment for conditions other than malignant neoplasm: Secondary | ICD-10-CM

## 2023-10-13 MED ORDER — HYDROCODONE-ACETAMINOPHEN 5-325 MG PO TABS: 1.0000 | ORAL_TABLET | ORAL | 0 refills | Status: DC | PRN

## 2023-10-13 NOTE — Telephone Encounter (Signed)
Provider sent in different Rx.

## 2023-10-13 NOTE — Telephone Encounter (Signed)
Surgical Date: 10/09/2023 Procedure: XI ROBOTIC LAPAROSCOPIC ASSISTED APPENDECTOMY   Received call from patient mother, Tosha [phone]~ telephone.   Reports that patient cannot tolerate Oxycodone. States that patient is having visual hallucinations while taking medication.   Advised to stop Oxycodone. Advised to use OTC APAP/ IBU as follows: Take Tylenol and Ibuprofen as needed for pain control, alternating every 4-6 hours.  Example: Tylenol 1000mg  @ 6am, 12noon, 6pm, (Do not exceed 4000mg  of Tylenol a day). Ibuprofen 800mg  @ 9am, 3pm, 9pm, 3am (Do not exceed 3200mg  of Ibuprofen a day).   Inquired if there is another medication available in case patient requires it for breakthrough pain.   Please advise.

## 2023-10-13 NOTE — Progress Notes (Signed)
Change in therapy from oxycodone to hydrocodone for pain.

## 2023-10-14 LAB — SURGICAL PATHOLOGY

## 2023-10-14 NOTE — Anesthesia Postprocedure Evaluation (Signed)
Anesthesia Post Note  Patient: Taylor Jefferson  Procedure(s) Performed: XI ROBOTIC LAPAROSCOPIC ASSISTED APPENDECTOMY (Abdomen)  Patient location during evaluation: Phase II Anesthesia Type: General Level of consciousness: awake Pain management: pain level controlled Vital Signs Assessment: post-procedure vital signs reviewed and stable Respiratory status: spontaneous breathing and respiratory function stable Cardiovascular status: blood pressure returned to baseline and stable Postop Assessment: no headache and no apparent nausea or vomiting Anesthetic complications: no Comments: Late entry   No notable events documented.   Last Vitals:  Vitals:   10/09/23 1545 10/09/23 1554  BP: 114/75 114/77  Pulse:  97  Resp:  16  Temp:  36.6 C  SpO2:  100%    Last Pain:  Vitals:   10/09/23 1554  TempSrc: Oral  PainSc: 1                  Windell Norfolk

## 2023-10-21 ENCOUNTER — Ambulatory Visit (INDEPENDENT_AMBULATORY_CARE_PROVIDER_SITE_OTHER): Payer: Medicaid Other | Admitting: Licensed Clinical Social Worker

## 2023-10-21 ENCOUNTER — Encounter (HOSPITAL_COMMUNITY): Payer: Self-pay | Admitting: Licensed Clinical Social Worker

## 2023-10-21 ENCOUNTER — Encounter (HOSPITAL_COMMUNITY): Payer: Self-pay

## 2023-10-21 ENCOUNTER — Telehealth (INDEPENDENT_AMBULATORY_CARE_PROVIDER_SITE_OTHER): Payer: Medicaid Other | Admitting: General Surgery

## 2023-10-21 DIAGNOSIS — F411 Generalized anxiety disorder: Secondary | ICD-10-CM | POA: Diagnosis not present

## 2023-10-21 DIAGNOSIS — Z09 Encounter for follow-up examination after completed treatment for conditions other than malignant neoplasm: Secondary | ICD-10-CM

## 2023-10-21 DIAGNOSIS — F902 Attention-deficit hyperactivity disorder, combined type: Secondary | ICD-10-CM

## 2023-10-21 DIAGNOSIS — F331 Major depressive disorder, recurrent, moderate: Secondary | ICD-10-CM

## 2023-10-21 NOTE — Progress Notes (Signed)
   THERAPIST PROGRESS NOTE  Session Time: 11:00am-11:55am  Participation Level: Active  Behavioral Response: CasualAlertEuthymic  Type of Therapy: Individual Therapy  Treatment Goals addressed:  Goal: LTG: Reduce frequency, intensity, and duration of depression symptoms so that daily functioning is improved     Dates: Start:  10/21/23    Expected End:  04/19/24       Disciplines: Interdisciplinary, PROVIDER             Goal: LTG: Increase coping skills to manage depression and improve ability to perform daily activities     Dates: Start:  10/21/23    Expected End:  04/19/24       Disciplines: Interdisciplinary, PROVIDER      ProgressTowards Goals: Progressing  Interventions: CBT  Summary: [name] is a 22 y.o. female who presents with MDD, recurrent, moderate, GAD, and ADHD combined.   Suicidal/Homicidal: Nowithout intent/plan  Therapist Response: Reneta engaged well in individual and person session with clinician.  Clinician utilized CBT to process thoughts feelings and interactions.  Evelynn shared progress in her journaling via song writing and shared a song she recently recorded with clinician.  Clinician validated the use of music as a therapeutic device, especially when it comes to making sense of difficult topics and experiences.  Clinician discussed the lyrics regarding miracles father and coping with his loss 4 years later.  Clinician discussed miracles plans for the future which include music, but will also have to be a way to support herself in the here and now.  [name] shared some thoughts about going back to school, but noted concerns about cost.  Clinician explored interactions with family.  Plan: Return again in 2 weeks.  Diagnosis: MDD (major depressive disorder), recurrent episode, moderate (HCC)  GAD (generalized anxiety disorder)  Attention deficit hyperactivity disorder (ADHD), combined type  Collaboration of Care: Referral or follow-up with  counselor/therapist AEB referred to Staci Kerns for med management. Appt scheduled in 1 week.   Patient/Guardian was advised Release of Information must be obtained prior to any record release in order to collaborate their care with an outside provider. Patient/Guardian was advised if they have not already done so to contact the registration department to sign all necessary forms in order for us  to release information regarding their care.   Consent: Patient/Guardian gives verbal consent for treatment and assignment of benefits for services provided during this visit. Patient/Guardian expressed understanding and agreed to proceed.   [name] [place], LCSW 10/21/2023

## 2023-10-21 NOTE — Telephone Encounter (Signed)
 Virtual postoperative telephone visit performed with patient's mother.  I was in the office and the patient's mother was at home.  She states that she is doing well.  She is back to work.  Adjusting her pain medications did help her significantly.  She is not using them at the present time.  The Dermabond is still present, but is starting to peel off.  Patient's mother had no other concerns. I told her to call me should any problems arise.  As this was a part of the total global surgical fee, this was not a billable visit.  Total telephone time was 1-1/2 minutes.

## 2023-10-27 ENCOUNTER — Telehealth (HOSPITAL_COMMUNITY): Payer: Medicaid Other | Admitting: Family

## 2023-10-27 ENCOUNTER — Encounter (HOSPITAL_COMMUNITY): Payer: Self-pay | Admitting: Family

## 2023-10-27 DIAGNOSIS — F331 Major depressive disorder, recurrent, moderate: Secondary | ICD-10-CM | POA: Diagnosis not present

## 2023-10-27 DIAGNOSIS — F411 Generalized anxiety disorder: Secondary | ICD-10-CM

## 2023-10-27 MED ORDER — ESCITALOPRAM OXALATE 10 MG PO TABS: 10.0000 mg | ORAL_TABLET | Freq: Every day | ORAL | 2 refills | Status: DC

## 2023-10-27 MED ORDER — HYDROXYZINE HCL 10 MG PO TABS: 10.0000 mg | ORAL_TABLET | Freq: Three times a day (TID) | ORAL | 0 refills | Status: DC | PRN

## 2023-10-27 NOTE — Progress Notes (Signed)
 Virtual Visit via Video Note  I connected with Taylor Jefferson on 10/27/23 at  7:00 AM EST by a video enabled telemedicine application and verified that I am speaking with the correct person using two identifiers.  Location: Patient: Home Provider: Office   I discussed the limitations of evaluation and management by telemedicine and the availability of in person appointments. The patient expressed understanding and agreed to proceed.   I discussed the assessment and treatment plan with the patient. The patient was provided an opportunity to ask questions and all were answered. The patient agreed with the plan and demonstrated an understanding of the instructions.   The patient was advised to call back or seek an in-person evaluation if the symptoms worsen or if the condition fails to improve as anticipated.  I provided 15 minutes of non-face-to-face time during this encounter.   Staci LOISE Kerns, NP    Psychiatric Initial Adult Assessment   Patient Identification: Taylor Jefferson MRN:  [id number] Date of Evaluation:  10/27/2023 Referral Source: therapist DOROTHA Rosser Chief Complaint:  Taylor Jefferson stated  I get angry easily. Visit Diagnosis:    ICD-10-CM   1. MDD (major depressive disorder), recurrent episode, moderate (HCC)  F33.1     2. GAD (generalized anxiety disorder)  F41.1       History of Present Illness: Taylor Jefferson 23 year old African-American female was seen via [facility].  She presents to establish care.  She reports a history related to major depressive disorder, generalized anxiety disorder and attention deficit disorder.  She denied that she has been prescribed any medications over the past year.  States she was followed by [facility] psychiatrist Akintayo for medication management while in high school.  Reports more recent her symptoms includes increased depression, mood irritability ,social isolation, decreased appetite, and disruptive  sleep.  Taylor Jefferson reports she completed high school and is currently employed at a customer service manager.  Denies that she has any children.  Reports she has a support system with her mom.  She reports daily marijuana use and occasional alcohol use.  Discussed taking psychotropic medications with THC/marijuana.  Patient reports she is willing to try.  Kynisha reports she has been followed by therapy services since her fifth grade year. Taylor Jefferson).  Recounts some childhood trauma.  Denied history related to physical sexual or emotional abuse.  She reports 4 previous suicide attempts.  States after the passing of her father 6 years ago she has not been inpatient since.  Rating her depression and anxiety 6 out of 10 with 10 being the worst.  Discussed initiating Lexapro  10 mg daily.  With consideration for Tegretol for reported anger management issues.  This provider will introduce 1 medication at a time pending medications compliance/tolerability.  Patient to follow-up 1 month for medication management.  Major depressive disorder (MDD), generalized anxiety disorder (GAD) currently followed by therapy services in office.  Taylor Jefferson was followed by psychiatrist Akintayo for medication management.  Chart review patient was prescribed Wellbutrin  150 mg, hydroxyzine  25 mg, trazodone  50 mg and Trileptal  150 mg twice daily.  Unsure if patient is able to tolerate medications.  During evaluation Taylor Jefferson is sitting in bed; she is alert/oriented x 4; calm/cooperative; and mood congruent with affect.  Patient is speaking in a clear tone at moderate volume, and normal pace; with good eye contact. Her thought process is coherent and relevant; There is no indication that she is currently responding to internal/external stimuli or experiencing delusional thought content.  Patient  denies suicidal/self-harm/homicidal ideation, psychosis, and paranoia.  Patient has remained calm throughout assessment and has  answered questions appropriately.  Associated Signs/Symptoms: Depression Symptoms:  depressed mood, anhedonia, difficulty concentrating, anxiety, (Hypo) Manic Symptoms:  Distractibility, Irritable Mood, Anxiety Symptoms:  Excessive Worry, Psychotic Symptoms:  Hallucinations: None PTSD Symptoms: Reported childhood trauma  Past Psychiatric History: Major depressive disorder MDD, generalized anxiety disorder GAD currently followed by therapy services.  Was followed by psychiatrist Akintayo for medication management.  Chart review patient was prescribed Wellbutrin  150 mg, hydroxyzine  25 mg, trazodone  50 mg and Trileptal  150 mg twice daily.  Unsure if patient is able to tolerate medications.  Previous Psychotropic Medications: Yes   Substance Abuse History in the last 12 months:  Yes.    Consequences of Substance Abuse: NA  Past Medical History:  Past Medical History:  Diagnosis Date   ADD (attention deficit disorder)    Allergy    Amenorrhea 11/02/2014   Anxiety    Asthma    Chlamydia 09/27/2019   tx 12/7, POC________   Contraceptive education 04/04/2014   Contraceptive management 05/06/14   Death of parent 08-28-20   Depression    HA (headache)    History of chlamydia 10/25/2019   Irregular periods 03/17/2019   Patellar pain    Dr. 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   Precocious puberty 10/08/2011   Reflux    Reflux    Seasonal allergies    Suicidal thoughts 01/06/2018   Vaginal burning 11/02/2014   Vaginal discharge 11/02/2014   Vaginal ulceration 04/04/2014   Will culture for HSV GC/CHL and a wound culture was obtained    Past Surgical History:  Procedure Laterality Date   XI ROBOTIC LAPAROSCOPIC ASSISTED APPENDECTOMY N/A 10/09/2023   Procedure: XI ROBOTIC LAPAROSCOPIC ASSISTED APPENDECTOMY;  Surgeon: Mavis Anes, MD;  Location: [facility];  Service: General;  Laterality: N/A;    Family Psychiatric History: Reported mother is diagnosed with  depression and  Family History:  Family History  Problem Relation Age of Onset   Depression Mother    HIV Mother    Other Mother        spinal stenosis   Glaucoma Mother    Migraines Mother    Heart disease Father    Hypertension Maternal Grandmother    Arthritis Maternal Grandmother        rheumatoid   Other Maternal Grandmother        acid reflux   Migraines Maternal Grandmother    Depression Maternal Grandmother    Hyperlipidemia Maternal Grandfather    ADD / ADHD Sister    ADD / ADHD Brother     Social History:   Social History   Socioeconomic History   Marital status: Single    Spouse name: Not on file   Number of children: Not on file   Years of education: Not on file   Highest education level: Not on file  Occupational History   Not on file  Tobacco Use   Smoking status: Every Day    Types: Cigarettes    Passive exposure: Current   Smokeless tobacco: Never  Vaping Use   Vaping status: Every Day   Substances: Nicotine  Substance and Sexual Activity   Alcohol use: Yes    Comment: occassionally   Drug use: Yes    Types: Marijuana    Comment: daily   Sexual activity: Yes  Other Topics Concern   Not on file  Social History Narrative  Izzy is an 11th grade student at [employer].  She performs well in school.    Lives with mother and maternal grandmother. She has paternal half siblings that do not live in the home.   Social Drivers of Corporate Investment Banker Strain: Not on file  Food Insecurity: Not on file  Transportation Needs: Not on file  Physical Activity: Not on file  Stress: Not on file  Social Connections: Not on file    Additional Social History:   Allergies:   Allergies  Allergen Reactions   Other Other (See Comments)    Seasonal - runny nose, itchy eyes    Metabolic Disorder Labs: Lab Results  Component Value Date   HGBA1C 5.2 05/18/2021   MPG 103 05/18/2021   MPG 53.75 01/07/2018   Lab Results   Component Value Date   PROLACTIN 55.3 (H) 01/07/2018   Lab Results  Component Value Date   CHOL 256 (H) 05/18/2021   TRIG 180 (H) 05/18/2021   HDL 40 (L) 05/18/2021   CHOLHDL 6.4 05/18/2021   VLDL 36 05/18/2021   LDLCALC 180 (H) 05/18/2021   LDLCALC 141 (H) 01/07/2018   Lab Results  Component Value Date   TSH 2.149 05/18/2021    Therapeutic Level Labs: No results found for: LITHIUM No results found for: CBMZ No results found for: VALPROATE  Current Medications: Current Outpatient Medications  Medication Sig Dispense Refill   HYDROcodone -acetaminophen  (NORCO/VICODIN) 5-325 MG tablet Take 1 tablet by mouth every 4 (four) hours as needed for moderate pain (pain score 4-6). 15 tablet 0   No current facility-administered medications for this visit.    Musculoskeletal: Teleassessment   Psychiatric Specialty Exam: Review of Systems  Psychiatric/Behavioral:  Positive for decreased concentration and sleep disturbance. Negative for suicidal ideas. The patient is nervous/anxious.   All other systems reviewed and are negative.   Last menstrual period 09/21/2023.There is no height or weight on file to calculate BMI.  General Appearance: Casual  Eye Contact:  Good  Speech:  Clear and Coherent  Volume:  Normal  Mood:  Anxious and Depressed  Affect:  Congruent  Thought Process:  Coherent  Orientation:  Full (Time, Place, and Person)  Thought Content:  Logical  Suicidal Thoughts:  No  Homicidal Thoughts:  No  Memory:  Immediate;   Good Recent;   Good  Judgement:  Good  Insight:  Good  Psychomotor Activity:  Normal  Concentration:  Concentration: Good  Recall:  Good  Fund of Knowledge:Good  Language: Fair  Akathisia:  No  Handed:  Right  AIMS (if indicated):  not done  Assets:  Communication Skills Desire for Improvement Resilience Social Support  ADL's:  Intact  Cognition: WNL  Sleep:  Good   Screenings: AIMS    Flowsheet Row Admission (Discharged)  [facility]  AIMS Total Score 0      AUDIT    Flowsheet Row [facility]) [facility] Admission (Discharged) from OP [facility]  Alcohol Use Disorder Identification Test Final Score (AUDIT) 4 0      PHQ2-9    Flowsheet Row ED from 05/16/2021 in [facility] Counselor from 12/05/2020 in [facility] Visit from 01/31/2020 in [facility]  PHQ-2 Total Score 4 2 0  PHQ-9 Total Score 11 8 8       Flowsheet Row ED to Hosp-Admission (Discharged) from 10/09/2023 in [facility] ED from 10/08/2023 in [facility] at [facility] ED from 05/02/2023 in [facility] [place]  C-SSRS RISK CATEGORY No Risk No Risk No Risk       Assessment and Plan: Audry Goldberg 23 year old African-American female presents to establish care.  Currently followed by therapy services which she reports she has been seeing the same therapist since the fifth grade.  Reports her mood continues to fluctuate as she struggles with depression and anxiety social isolation and mood irritability.  Unable to recall any medication she is taking in the past.  This visit discussed initiating Lexapro  with consideration of initiating Tegretol for mood stabilization.  She was receptive to plan.  Discussed discontinuing marijuana/THC use due to current psychotropic medications.  Prescribed.  She appeared reluctant however was amendable to treatment plan.  Patient to follow-up 1 month.  Major depressive disorder Generalized anxiety Initiated Lexapro  10 mg p.o. daily Consideration for Trileptal  150 mg p.o. twice daily at follow-up visit  Continue therapy services Follow-up 1  month  Collaboration of Care: Medication Management AEB initiated Lexapro   Patient/Guardian was advised Release of Information must be obtained prior to any record release in order to collaborate their care with an outside provider. Patient/Guardian was advised if they have not already done so to contact the registration department to sign all necessary forms in order for us  to release information regarding their care.   Consent: Patient/Guardian gives verbal consent for treatment and assignment of benefits for services provided during this visit. Patient/Guardian expressed understanding and agreed to proceed.   Staci LOISE Kerns, NP 1/6/20258:31 AM

## 2023-10-28 ENCOUNTER — Telehealth: Payer: Self-pay | Admitting: *Deleted

## 2023-10-28 ENCOUNTER — Telehealth: Payer: Medicaid Other | Admitting: Physician Assistant

## 2023-10-28 DIAGNOSIS — Z9049 Acquired absence of other specified parts of digestive tract: Secondary | ICD-10-CM

## 2023-10-28 DIAGNOSIS — R1030 Lower abdominal pain, unspecified: Secondary | ICD-10-CM

## 2023-10-28 DIAGNOSIS — J069 Acute upper respiratory infection, unspecified: Secondary | ICD-10-CM | POA: Diagnosis not present

## 2023-10-28 MED ORDER — PSEUDOEPH-BROMPHEN-DM 30-2-10 MG/5ML PO SYRP: 5.0000 mL | ORAL_SOLUTION | Freq: Four times a day (QID) | ORAL | 0 refills | Status: DC | PRN

## 2023-10-28 NOTE — Telephone Encounter (Signed)
 Surgical Date: 10/09/2023 Procedure: XI ROBOTIC LAPAROSCOPIC ASSISTED APPENDECTOMY  Received call from patient mother Tosha [phone]) [phone]~ telephone.   Reports that patient is having abdominal pain. Patient added to telephone call. States that she has x2 days of lower abdominal pain, diarrhea, and URI Sx.   Advised that Sx present as virus. Advised to keep hydrated, rest and continue to monitor. If sx worsen, or if patient is unable to keep fluids down, to go to ER for evaluation.

## 2023-10-28 NOTE — Progress Notes (Signed)
 Virtual Visit Consent   Emalee MARKEDA Jefferson, you are scheduled for a virtual visit with a [facility] provider today. Just as with appointments in the office, your consent must be obtained to participate. Your consent will be active for this visit and any virtual visit you may have with one of our providers in the next 365 days. If you have a MyChart account, a copy of this consent can be sent to you electronically.  As this is a virtual visit, video technology does not allow for your provider to perform a traditional examination. This may limit your provider's ability to fully assess your condition. If your provider identifies any concerns that need to be evaluated in person or the need to arrange testing (such as labs, EKG, etc.), we will make arrangements to do so. Although advances in technology are sophisticated, we cannot ensure that it will always work on either your end or our end. If the connection with a video visit is poor, the visit may have to be switched to a telephone visit. With either a video or telephone visit, we are not always able to ensure that we have a secure connection.  By engaging in this virtual visit, you consent to the provision of healthcare and authorize for your insurance to be billed (if applicable) for the services provided during this visit. Depending on your insurance coverage, you may receive a charge related to this service.  I need to obtain your verbal consent now. Are you willing to proceed with your visit today? Taylor Jefferson has provided verbal consent on 10/28/2023 for a virtual visit (video or telephone). Taylor CHRISTELLA Dickinson, PA-C  Date: 10/28/2023 4:24 PM  Virtual Visit via Video Note   I, Taylor Jefferson, connected with  Taylor Jefferson  ([id number], November 14, 2000) on 10/28/23 at  3:30 PM EST by a video-enabled telemedicine application and verified that I am speaking with the correct person using two identifiers.  Location: Patient: Virtual Visit  Location Patient: Home Provider: Virtual Visit Location Provider: Home Office   I discussed the limitations of evaluation and management by telemedicine and the availability of in person appointments. The patient expressed understanding and agreed to proceed.    History of Present Illness: Taylor Jefferson is a 23 y.o. who identifies as a female who was assigned female at birth, and is being seen today for cough.  HPI: Cough This is a new problem. The current episode started in the past 7 days (Started on 10/24/23). The problem has been gradually improving. Associated symptoms include nasal congestion (improving), postnasal drip (improving), rhinorrhea (improving), a sore throat (improved) and wheezing (improving). Pertinent negatives include no chills, ear congestion, ear pain, fever or myalgias. Associated symptoms comments: Having diarrhea, started 2 days ago. The symptoms are aggravated by lying down. She has tried a beta-agonist inhaler and rest for the symptoms. Her past medical history is significant for asthma.    Lower abdominal pain: Had Lap appendectomy on 10/09/23. Had symptom improvement but over the last couple of days lower abdominal pain has increased and is becoming more severe. She does report looser BM over the last couple of days. She is able to pass gas. She denies bloating. Reports TTP over lower abdomen. Mild nausea. Denies any melena or hematochezia. Denies Vomiting.  Problems:  Patient Active Problem List   Diagnosis Date Noted   Acute appendicitis with localized peritonitis, without perforation, abscess, or gangrene 10/09/2023   Screening examination for STD (sexually transmitted disease) 05/30/2022  Herpes 05/30/2022   History of chlamydia 05/30/2022   Marijuana use 05/17/2021   Recurrent major depressive disorder (HCC) 05/17/2021   Unspecified mood (affective) disorder (HCC) 05/17/2021   Anxiety disorder, unspecified 05/17/2021   Intentional overdose of drug in  tablet form (HCC) 05/16/2021   Pregnancy examination or test, negative result 01/02/2021   Nexplanon  in place 01/02/2021   Irregular intermenstrual bleeding 01/02/2021   Non-seasonal allergic rhinitis due to pollen 08/29/2020   Mild persistent asthma without complication 02/29/2020   Flat feet, bilateral 10/09/2018   MDD (major depressive disorder), recurrent severe, 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   Migraine without aura and without status migrainosus, not intractable 12/24/2016   Anxiety state 01/04/2015   Tension headache 01/04/2015   Contraceptive management 04/11/2014   Vaginal ulceration 04/04/2014    Allergies:  Allergies  Allergen Reactions   Other Other (See Comments)    Seasonal - runny nose, itchy eyes   Medications:  Current Outpatient Medications:    brompheniramine-pseudoephedrine-DM 30-2-10 MG/5ML syrup, Take 5 mLs by mouth 4 (four) times daily as needed., Disp: 120 mL, Rfl: 0   escitalopram  (LEXAPRO ) 10 MG tablet, Take 1 tablet (10 mg total) by mouth daily., Disp: 30 tablet, Rfl: 2   HYDROcodone -acetaminophen  (NORCO/VICODIN) 5-325 MG tablet, Take 1 tablet by mouth every 4 (four) hours as needed for moderate pain (pain score 4-6)., Disp: 15 tablet, Rfl: 0   hydrOXYzine  (ATARAX ) 10 MG tablet, Take 1 tablet (10 mg total) by mouth 3 (three) times daily as needed., Disp: 30 tablet, Rfl: 0  Observations/Objective: Patient is well-developed, well-nourished in no acute distress.  Resting comfortably at home.  Head is normocephalic, atraumatic.  No labored breathing.  Speech is clear and coherent with logical content.  Patient is alert and oriented at baseline.    Assessment and Plan: 1. Viral URI with cough (Primary) - brompheniramine-pseudoephedrine-DM 30-2-10 MG/5ML syrup; Take 5 mLs by mouth 4 (four) times daily as needed.  Dispense: 120 mL; Refill: 0  2. Lower abdominal pain  3. S/P laparoscopic appendectomy  - Suspect viral URI - Symptomatic medications of  choice over the counter as needed - Bromfed DM for cough - Push fluids - Rest - Seek further evaluation if symptoms change or worsen  - Advised to call gen surg office about abdominal pain post op - Seek immediate evaluation at local ER if symtpoms worsen or if she develops a fever, chills, nausea, vomiting   Follow Up Instructions: I discussed the assessment and treatment plan with the patient. The patient was provided an opportunity to ask questions and all were answered. The patient agreed with the plan and demonstrated an understanding of the instructions.  A copy of instructions were sent to the patient via MyChart unless otherwise noted below.    The patient was advised to call back or seek an in-person evaluation if the symptoms worsen or if the condition fails to improve as anticipated.    Taylor CHRISTELLA Dickinson, PA-C

## 2023-10-28 NOTE — Patient Instructions (Signed)
 Taylor Jefferson, thank you for joining Delon CHRISTELLA Dickinson, PA-C for today's virtual visit.  While this provider is not your primary care provider (PCP), if your PCP is located in our provider database this encounter information will be shared with them immediately following your visit.   A [facility] MyChart account gives you access to today's visit and all your visits, tests, and labs performed at [facility]  click here if you don't have a [facility] MyChart account or go to [email or web address]  Consent: (Patient) Taylor Jefferson provided verbal consent for this virtual visit at the beginning of the encounter.  Current Medications:  Current Outpatient Medications:    brompheniramine-pseudoephedrine-DM 30-2-10 MG/5ML syrup, Take 5 mLs by mouth 4 (four) times daily as needed., Disp: 120 mL, Rfl: 0   escitalopram  (LEXAPRO ) 10 MG tablet, Take 1 tablet (10 mg total) by mouth daily., Disp: 30 tablet, Rfl: 2   HYDROcodone -acetaminophen  (NORCO/VICODIN) 5-325 MG tablet, Take 1 tablet by mouth every 4 (four) hours as needed for moderate pain (pain score 4-6)., Disp: 15 tablet, Rfl: 0   hydrOXYzine  (ATARAX ) 10 MG tablet, Take 1 tablet (10 mg total) by mouth 3 (three) times daily as needed., Disp: 30 tablet, Rfl: 0   Medications ordered in this encounter:  Meds ordered this encounter  Medications   brompheniramine-pseudoephedrine-DM 30-2-10 MG/5ML syrup    Sig: Take 5 mLs by mouth 4 (four) times daily as needed.    Dispense:  120 mL    Refill:  0    Supervising Provider:   BLAISE ALEENE KIDD [[id number]]     *If you need refills on other medications prior to your next appointment, please contact your pharmacy*  Follow-Up: Call back or seek an in-person evaluation if the symptoms worsen or if the condition fails to improve as anticipated.  [facility] [phone]  Other Instructions Upper Respiratory Infection, Adult An upper respiratory infection (URI) is  a common viral infection of the nose, throat, and upper air passages that lead to the lungs. The most common type of URI is the common cold. URIs usually get better on their own, without medical treatment. What are the causes? A URI is caused by a virus. You may catch a virus by: Breathing in droplets from an infected person's cough or sneeze. Touching something that has been exposed to the virus (is contaminated) and then touching your mouth, nose, or eyes. What increases the risk? You are more likely to get a URI if: You are very young or very old. You have close contact with others, such as at work, school, or a health care facility. You smoke. You have long-term (chronic) heart or lung disease. You have a weakened disease-fighting system (immune system). You have nasal allergies or asthma. You are experiencing a lot of stress. You have poor nutrition. What are the signs or symptoms? A URI usually involves some of the following symptoms: Runny or stuffy (congested) nose. Cough. Sneezing. Sore throat. Headache. Fatigue. Fever. Loss of appetite. Pain in your forehead, behind your eyes, and over your cheekbones (sinus pain). Muscle aches. Redness or irritation of the eyes. Pressure in the ears or face. How is this diagnosed? This condition may be diagnosed based on your medical history and symptoms, and a physical exam. Your health care provider may use a swab to take a mucus sample from your nose (nasal swab). This sample can be tested to determine what virus is causing the illness. How is this treated?  URIs usually get better on their own within 7-10 days. Medicines cannot cure URIs, but your health care provider may recommend certain medicines to help relieve symptoms, such as: Over-the-counter cold medicines. Cough suppressants. Coughing is a type of defense against infection that helps to clear the respiratory system, so take these medicines only as recommended by your health  care provider. Fever-reducing medicines. Follow these instructions at home: Activity Rest as needed. If you have a fever, stay home from work or school until your fever is gone or until your health care provider says your URI cannot spread to other people (is no longer contagious). Your health care provider may have you wear a face mask to prevent your infection from spreading. Relieving symptoms Gargle with a mixture of salt and water  3-4 times a day or as needed. To make salt water , completely dissolve -1 tsp (3-6 g) of salt in 1 cup (237 mL) of warm water . Use a cool-mist humidifier to add moisture to the air. This can help you breathe more easily. Eating and drinking  Drink enough fluid to keep your urine pale yellow. Eat soups and other clear broths. General instructions  Take over-the-counter and prescription medicines only as told by your health care provider. These include cold medicines, fever reducers, and cough suppressants. Do not use any products that contain nicotine or tobacco. These products include cigarettes, chewing tobacco, and vaping devices, such as e-cigarettes. If you need help quitting, ask your health care provider. Stay away from secondhand smoke. Stay up to date on all immunizations, including the yearly (annual) flu vaccine. Keep all follow-up visits. This is important. How to prevent the spread of infection to others URIs can be contagious. To prevent the infection from spreading: Wash your hands with soap and water  for at least 20 seconds. If soap and water  are not available, use hand sanitizer. Avoid touching your mouth, face, eyes, or nose. Cough or sneeze into a tissue or your sleeve or elbow instead of into your hand or into the air.  Contact a health care provider if: You are getting worse instead of better. You have a fever or chills. Your mucus is brown or red. You have yellow or brown discharge coming from your nose. You have pain in your face,  especially when you bend forward. You have swollen neck glands. You have pain while swallowing. You have white areas in the back of your throat. Get help right away if: You have shortness of breath that gets worse. You have severe or persistent: Headache. Ear pain. Sinus pain. Chest pain. You have chronic lung disease along with any of the following: Making high-pitched whistling sounds when you breathe, most often when you breathe out (wheezing). Prolonged cough (more than 14 days). Coughing up blood. A change in your usual mucus. You have a stiff neck. You have changes in your: Vision. Hearing. Thinking. Mood. These symptoms may be an emergency. Get help right away. Call 911. Do not wait to see if the symptoms will go away. Do not drive yourself to the hospital. Summary An upper respiratory infection (URI) is a common infection of the nose, throat, and upper air passages that lead to the lungs. A URI is caused by a virus. URIs usually get better on their own within 7-10 days. Medicines cannot cure URIs, but your health care provider may recommend certain medicines to help relieve symptoms. This information is not intended to replace advice given to you by your health care provider. Make sure  you discuss any questions you have with your health care provider. Document Revised: 05/09/2021 Document Reviewed: 05/09/2021 Elsevier Patient Education  2024 Elsevier Inc.    If you have been instructed to have an in-person evaluation today at a local Urgent Care facility, please use the link below. It will take you to a list of all of our available [facility], including address, phone number and hours of operation. Please do not delay care.  [facility]  If you or a family member do not have a primary care provider, use the link below to schedule a visit and establish care. When you choose a [facility] primary care physician or advanced practice provider,  you gain a long-term partner in health. Find a Primary Care Provider  Learn more about [facility]'s in-office and virtual care options: [facility] - Get Care Now

## 2023-10-29 ENCOUNTER — Emergency Department (HOSPITAL_COMMUNITY)
Admission: EM | Admit: 2023-10-29 | Discharge: 2023-10-30 | Disposition: A | Discharge status: Home / Self Care | Payer: Medicaid Other | Attending: Emergency Medicine | Admitting: Emergency Medicine

## 2023-10-29 ENCOUNTER — Encounter (HOSPITAL_COMMUNITY): Payer: Self-pay

## 2023-10-29 DIAGNOSIS — K529 Noninfective gastroenteritis and colitis, unspecified: Secondary | ICD-10-CM | POA: Insufficient documentation

## 2023-10-29 DIAGNOSIS — R109 Unspecified abdominal pain: Secondary | ICD-10-CM | POA: Diagnosis present

## 2023-10-29 LAB — COMPREHENSIVE METABOLIC PANEL
ALT: 11 U/L (ref 0–44)
AST: 15 U/L (ref 15–41)
Albumin: 3.9 g/dL (ref 3.5–5.0)
Alkaline Phosphatase: 96 U/L (ref 38–126)
Anion gap: 8 (ref 5–15)
BUN: 9 mg/dL (ref 6–20)
CO2: 28 mmol/L (ref 22–32)
Calcium: 9.6 mg/dL (ref 8.9–10.3)
Chloride: 102 mmol/L (ref 98–111)
Creatinine, Ser: 0.81 mg/dL (ref 0.44–1.00)
GFR, Estimated: >60 mL/min (ref >60)
Glucose, Bld: 86 mg/dL (ref 70–99)
Potassium: 3.8 mmol/L (ref 3.5–5.1)
Sodium: 138 mmol/L (ref 135–145)
Total Bilirubin: 0.7 mg/dL (ref 0.0–1.2)
Total Protein: 8.1 g/dL (ref 6.5–8.1)

## 2023-10-29 LAB — LIPASE, BLOOD: Lipase: 38 U/L (ref 11–51)

## 2023-10-29 LAB — CBC
HCT: 45.1 % (ref 36.0–46.0)
Hemoglobin: 14.5 g/dL (ref 12.0–15.0)
MCH: 29.7 pg (ref 26.0–34.0)
MCHC: 32.2 g/dL (ref 30.0–36.0)
MCV: 92.2 fL (ref 80.0–100.0)
Platelets: 292 10*3/uL (ref 150–400)
RBC: 4.89 MIL/uL (ref 3.87–5.11)
RDW: 12.4 % (ref 11.5–15.5)
WBC: 5.5 10*3/uL (ref 4.0–10.5)
nRBC: 0 % (ref 0.0–0.2)

## 2023-10-29 LAB — POC URINE PREG, ED: Preg Test, Ur: NEGATIVE

## 2023-10-29 MED ORDER — ONDANSETRON HCL 4 MG/2ML IJ SOLN
4.0000 mg | Freq: Once | INTRAMUSCULAR | Status: AC
  Administered 2023-10-30: 4 mg via INTRAVENOUS
  Filled 2023-10-29: qty 2

## 2023-10-29 MED ORDER — SODIUM CHLORIDE 0.9 % IV BOLUS
1000.0000 mL | Freq: Once | INTRAVENOUS | Status: AC
  Administered 2023-10-30: 1000 mL via INTRAVENOUS

## 2023-10-29 MED ORDER — KETOROLAC TROMETHAMINE 30 MG/ML IJ SOLN
30.0000 mg | Freq: Once | INTRAMUSCULAR | Status: AC
  Administered 2023-10-30: 30 mg via INTRAVENOUS
  Filled 2023-10-29: qty 1

## 2023-10-29 MED ORDER — MORPHINE SULFATE (PF) 4 MG/ML IV SOLN
4.0000 mg | Freq: Once | INTRAVENOUS | Status: AC
  Administered 2023-10-30: 4 mg via INTRAVENOUS
  Filled 2023-10-29: qty 1

## 2023-10-29 NOTE — ED Triage Notes (Signed)
 Pt c/o lower abdominal pain/pelvic pain and nausea x3 days.  Pain score 7/10.  Pt reports rectal pain w/ BMs.  Pt had appendix removed on 12/18.  Sts she has called her Surgeon and they think she has a virus.  However, pain is similar to before surgery.

## 2023-10-29 NOTE — ED Provider Notes (Signed)
 [place] [facility] AT [facility] Provider Note   CSN: [id number] Arrival date & time: 10/29/23  1746     History  Chief Complaint  Patient presents with   Abdominal Pain   Nausea    Taylor Jefferson is a 23 y.o. female.  Patient is a 23 year old female with history of migraines, depression, and appendectomy performed in mid December by Dr. Mavis.  Patient presenting today with abdominal pain.  She describes generalized abdominal pain worsening over the past 3 days.  She has had some loose stool, but denies any bloody stool.  No fevers or chills.  Pain feels similar to what she experienced when she had appendicitis.  The history is provided by the patient.       Home Medications Prior to Admission medications   Medication Sig Start Date End Date Taking? Authorizing Provider  brompheniramine-pseudoephedrine-DM 30-2-10 MG/5ML syrup Take 5 mLs by mouth 4 (four) times daily as needed. 10/28/23   Vivienne Delon HERO, PA-C  escitalopram  (LEXAPRO ) 10 MG tablet Take 1 tablet (10 mg total) by mouth daily. 10/27/23   Ezzard Staci SAILOR, NP  HYDROcodone -acetaminophen  (NORCO/VICODIN) 5-325 MG tablet Take 1 tablet by mouth every 4 (four) hours as needed for moderate pain (pain score 4-6). 10/13/23 10/12/24  Mavis Anes, MD  hydrOXYzine  (ATARAX ) 10 MG tablet Take 1 tablet (10 mg total) by mouth 3 (three) times daily as needed. 10/27/23   Ezzard Staci SAILOR, NP      Allergies    Other    Review of Systems   Review of Systems  All other systems reviewed and are negative.   Physical Exam Updated Vital Signs BP 107/76 (BP Location: Left Arm)   Pulse 95   Temp 98.3 F (36.8 C)   Resp 16   Ht 5' 2 (1.575 m)   Wt 54 kg   LMP 09/21/2023 (Approximate)   SpO2 100%   BMI 21.77 kg/m  Physical Exam Vitals and nursing note reviewed.  Constitutional:      General: She is not in acute distress.    Appearance: She is well-developed. She is not diaphoretic.  HENT:      Head: Normocephalic and atraumatic.  Cardiovascular:     Rate and Rhythm: Normal rate and regular rhythm.     Heart sounds: No murmur heard.    No friction rub. No gallop.  Pulmonary:     Effort: Pulmonary effort is normal. No respiratory distress.     Breath sounds: Normal breath sounds. No wheezing.  Abdominal:     General: Bowel sounds are normal. There is no distension.     Palpations: Abdomen is soft.     Tenderness: There is generalized abdominal tenderness. There is no right CVA tenderness, left CVA tenderness, guarding or rebound.  Musculoskeletal:        General: Normal range of motion.     Cervical back: Normal range of motion and neck supple.  Skin:    General: Skin is warm and dry.  Neurological:     General: No focal deficit present.     Mental Status: She is alert and oriented to person, place, and time.     ED Results / Procedures / Treatments   Labs (all labs ordered are listed, but only abnormal results are displayed) Labs Reviewed  LIPASE, BLOOD  COMPREHENSIVE METABOLIC PANEL  CBC  URINALYSIS, ROUTINE W REFLEX MICROSCOPIC  POC URINE PREG, ED    EKG None  Radiology No results found.  Procedures Procedures    Medications Ordered in ED Medications  sodium chloride  0.9 % bolus 1,000 mL (has no administration in time range)  ondansetron  (ZOFRAN ) injection 4 mg (has no administration in time range)  ketorolac  (TORADOL ) 30 MG/ML injection 30 mg (has no administration in time range)  morphine  (PF) 4 MG/ML injection 4 mg (has no administration in time range)    ED Course/ Medical Decision Making/ A&P  Patient is a 23 year old female presenting with abdominal pain several weeks status post appendectomy.  Patient arrives here with stable vital signs and is afebrile.  She does have generalized abdominal tenderness, but no peritoneal signs.  Laboratory studies obtained including CBC, CMP, and lipase, all of which are unremarkable.  There is no  leukocytosis, no elevation of liver or pancreatic enzymes, and no electrolyte derangement.  Urinalysis is clear and pregnancy test is negative.  CT scan of the abdomen and pelvis obtained showing bowel wall thickening in both the small bowel and colon likely related to enterocolitis.  Patient treated with IV fluids along with morphine  for pain, Toradol  for pain, and Zofran  for nausea and seems to be feeling significantly improved.  Patient to be discharged with Flagyl , pain medication, and follow-up as needed.  Final Clinical Impression(s) / ED Diagnoses Final diagnoses:  None    Rx / DC Orders ED Discharge Orders     None         Geroldine Berg, MD 10/30/23 [phone]

## 2023-10-30 ENCOUNTER — Emergency Department (HOSPITAL_COMMUNITY): Payer: Medicaid Other

## 2023-10-30 LAB — URINALYSIS, ROUTINE W REFLEX MICROSCOPIC
Bacteria, UA: NONE SEEN
Bilirubin Urine: NEGATIVE
Glucose, UA: NEGATIVE mg/dL
Hgb urine dipstick: NEGATIVE
Ketones, ur: 5 mg/dL — AB
Leukocytes,Ua: NEGATIVE
Nitrite: NEGATIVE
Protein, ur: 30 mg/dL — AB
Specific Gravity, Urine: 1.025 (ref 1.005–1.030)
pH: 6 (ref 5.0–8.0)

## 2023-10-30 MED ORDER — HYDROCODONE-ACETAMINOPHEN 5-325 MG PO TABS: 1.0000 | ORAL_TABLET | Freq: Four times a day (QID) | ORAL | 0 refills | Status: DC | PRN

## 2023-10-30 MED ORDER — IOHEXOL 300 MG/ML  SOLN
100.0000 mL | Freq: Once | INTRAMUSCULAR | Status: AC | PRN
  Administered 2023-10-30: 100 mL via INTRAVENOUS

## 2023-10-30 MED ORDER — METRONIDAZOLE 500 MG PO TABS
500.0000 mg | ORAL_TABLET | Freq: Once | ORAL | Status: AC
  Administered 2023-10-30: 500 mg via ORAL
  Filled 2023-10-30: qty 1

## 2023-10-30 MED ORDER — METRONIDAZOLE 500 MG PO TABS: 500.0000 mg | ORAL_TABLET | Freq: Three times a day (TID) | ORAL | 0 refills | Status: DC

## 2023-10-30 NOTE — Discharge Instructions (Signed)
 Begin taking Flagyl  as prescribed.  Begin taking hydrocodone  as prescribed as needed for pain.  Clear liquids for the next 12 hours, then slowly advance diet as tolerated.  Return to the ER if you develop worsening pain, high fevers, bloody stools, or for other new and concerning symptoms.

## 2023-10-30 NOTE — ED Notes (Signed)
Patient verbalizes understanding of discharge instructions. Opportunity for questioning and answers were provided. Armband removed by staff, pt discharged from ED. Ambulated out to lobby with mom  

## 2023-10-31 NOTE — ED Notes (Signed)
 10/31/23 1542: Pt called stating pharmacy states medication is still not correct. This RN called [facility] and spoke with Shaun (pharmacist at [facility]) and per pharmacist prescription has been corrected but it will not be filled until later this evening. This RN called pt's mother Skippy and gave updated information at this time.

## 2023-10-31 NOTE — ED Notes (Signed)
 Pt called requesting flagyl order to be adjusted per pharmacy request. Flagyl is TID x 7 days for total of 21 pills. 10/31/2023 @1415hrs 

## 2023-11-05 ENCOUNTER — Ambulatory Visit (HOSPITAL_COMMUNITY): Payer: Medicaid Other | Admitting: Licensed Clinical Social Worker

## 2023-11-05 ENCOUNTER — Encounter (HOSPITAL_COMMUNITY): Payer: Self-pay | Admitting: Licensed Clinical Social Worker

## 2023-11-05 DIAGNOSIS — F411 Generalized anxiety disorder: Secondary | ICD-10-CM | POA: Diagnosis not present

## 2023-11-05 DIAGNOSIS — F902 Attention-deficit hyperactivity disorder, combined type: Secondary | ICD-10-CM

## 2023-11-05 DIAGNOSIS — F331 Major depressive disorder, recurrent, moderate: Secondary | ICD-10-CM

## 2023-11-05 NOTE — Progress Notes (Signed)
 Virtual Visit via Video Note  I connected with Taylor Jefferson on 11/05/23 at  1:30 PM EST by a video enabled telemedicine application and verified that I am speaking with the correct person using two identifiers.  Location: Patient: home Provider: home office   I discussed the limitations of evaluation and management by telemedicine and the availability of in person appointments. The patient expressed understanding and agreed to proceed.   I discussed the assessment and treatment plan with the patient. The patient was provided an opportunity to ask questions and all were answered. The patient agreed with the plan and demonstrated an understanding of the instructions.   The patient was advised to call back or seek an in-person evaluation if the symptoms worsen or if the condition fails to improve as anticipated.  I provided 40 minutes of non-face-to-face time during this encounter.   Seldon Dago, LCSW   THERAPIST PROGRESS NOTE  Session Time: 1:30pm-2:10pm  Participation Level: Active  Behavioral Response: NeatAlertEuthymic  Type of Therapy: Individual Therapy  Treatment Goals addressed:      Goal: LTG: Reduce frequency, intensity, and duration of depression symptoms so that daily functioning is improved     Dates: Start:  10/21/23    Expected End:  04/19/24       Disciplines: Interdisciplinary, PROVIDER                 Goal: LTG: Increase coping skills to manage depression and improve ability to perform daily activities     Dates: Start:  10/21/23    Expected End:  04/19/24       Disciplines: Interdisciplinary, PROVIDER      ProgressTowards Goals: Progressing  Interventions: CBT  Summary: Taylor Jefferson is a 23 y.o. female who presents with MDD, recurrent, moderate, GAD, and ADHD.   Suicidal/Homicidal: Nowithout intent/plan  Therapist Response: Taylor Jefferson engaged well in individual virtual session with clinician.  Clinician utilized CBT to process thoughts  feelings and interactions.  Clinician explored mood, health, and family interactions.  Clinician discussed recent appointment with Dan Dun for med management.  Clinician reviewed notes from [place] and explored miracles in view of appointment as well as medication prescription.  Taylor Jefferson shared some hope that the medication will help her feel better.  She shared comfort with Taylor Jefferson.  Clinician discussed plans for work, as well as plans and goals for this year.  Plan: Return again in 2 weeks.  Diagnosis: MDD (major depressive disorder), recurrent episode, moderate (HCC)  GAD (generalized anxiety disorder)  Attention deficit hyperactivity disorder (ADHD), combined type  Collaboration of Care: Medication Management AEB reviewed note from med appt.  Patient/Guardian was advised Release of Information must be obtained prior to any record release in order to collaborate their care with an outside provider. Patient/Guardian was advised if they have not already done so to contact the registration department to sign all necessary forms in order for us  to release information regarding their care.   Consent: Patient/Guardian gives verbal consent for treatment and assignment of benefits for services provided during this visit. Patient/Guardian expressed understanding and agreed to proceed.   Taylor Jefferson [employer], LCSW 11/05/2023

## 2023-11-18 ENCOUNTER — Encounter (HOSPITAL_COMMUNITY): Payer: Self-pay | Admitting: Licensed Clinical Social Worker

## 2023-11-18 ENCOUNTER — Ambulatory Visit (INDEPENDENT_AMBULATORY_CARE_PROVIDER_SITE_OTHER): Payer: Medicaid Other | Admitting: Licensed Clinical Social Worker

## 2023-11-18 ENCOUNTER — Other Ambulatory Visit (HOSPITAL_COMMUNITY): Payer: Self-pay | Admitting: Family

## 2023-11-18 DIAGNOSIS — F331 Major depressive disorder, recurrent, moderate: Secondary | ICD-10-CM | POA: Diagnosis not present

## 2023-11-18 NOTE — Progress Notes (Signed)
Virtual Visit via Video Note  I connected with TIJANA WALDER on 11/18/23 at 11:00 AM EST by a video enabled telemedicine application and verified that I am speaking with the correct person using two identifiers.  Location: Patient: parked car Provider: office   I discussed the limitations of evaluation and management by telemedicine and the availability of in person appointments. The patient expressed understanding and agreed to proceed.   I discussed the assessment and treatment plan with the patient. The patient was provided an opportunity to ask questions and all were answered. The patient agreed with the plan and demonstrated an understanding of the instructions.   The patient was advised to call back or seek an in-person evaluation if the symptoms worsen or if the condition fails to improve as anticipated.  I provided 40 minutes of non-face-to-face time during this encounter.   Veneda Melter, LCSW   THERAPIST PROGRESS NOTE  Session Time: 11:10am-11:50am  Participation Level: Active  Behavioral Response: CasualAlertIrritable  Type of Therapy: Individual Therapy  Treatment Goals addressed:  Goal: LTG: Reduce frequency, intensity, and duration of depression symptoms so that daily functioning is improved     Dates: Start:  10/21/23    Expected End:  04/19/24       Disciplines: Interdisciplinary, PROVIDER                   Goal: LTG: Increase coping skills to manage depression and improve ability to perform daily activities     Dates: Start:  10/21/23    Expected End:  04/19/24       Disciplines: Interdisciplinary, PROVIDER      ProgressTowards Goals: Progressing  Interventions: CBT  Summary: ANAPAOLA KINSEL is a 23 y.o. female who presents with MDD, recurrent, moderate.   Suicidal/Homicidal: Nowithout intent/plan  Therapist Response: Lanyla engaged well in individual virtual session with clinician. Clinician utilized CBT to process thoughts, feelings,  and interactions. Saudia provided updates about high levels of frustration and challenges with mother-daughter relationship. Clinician discussed mother's reactions of Roann's behaviors, continuing arguing and complaining, and increased anger and depressed mood. Clinician explored coping skills, as well as ways to change her behavior to reduce the number of problems mother complains about. Clinician explored mother and Jaylanni's levels of depression. Clinician also explored ways to make a peace offering in order to be able to manage life in the house for the next year until Darria can get herself independent. Clinician offered a family session with mom and Samyuktha at the next session on 2/11. Deolinda agreed and will offer this to mom.   Plan: Return again in 2 weeks.  Diagnosis: MDD (major depressive disorder), recurrent episode, moderate (HCC)  Collaboration of Care: Patient refused AEB none required. Next session will be family session.   Patient/Guardian was advised Release of Information must be obtained prior to any record release in order to collaborate their care with an outside provider. Patient/Guardian was advised if they have not already done so to contact the registration department to sign all necessary forms in order for [place] to release information regarding their care.   Consent: Patient/Guardian gives verbal consent for treatment and assignment of benefits for services provided during this visit. Patient/Guardian expressed understanding and agreed to proceed.   Chryl Heck [place], LCSW 11/18/2023

## 2023-12-02 ENCOUNTER — Ambulatory Visit (HOSPITAL_COMMUNITY): Payer: Medicaid Other | Admitting: Licensed Clinical Social Worker

## 2023-12-03 ENCOUNTER — Ambulatory Visit (HOSPITAL_COMMUNITY): Payer: Medicaid Other | Admitting: Family

## 2023-12-23 ENCOUNTER — Ambulatory Visit (INDEPENDENT_AMBULATORY_CARE_PROVIDER_SITE_OTHER): Admitting: *Deleted

## 2023-12-23 ENCOUNTER — Other Ambulatory Visit: Payer: Self-pay | Admitting: Adult Health

## 2023-12-23 VITALS — BP 125/75 | HR 83

## 2023-12-23 DIAGNOSIS — Z3201 Encounter for pregnancy test, result positive: Secondary | ICD-10-CM | POA: Diagnosis not present

## 2023-12-23 LAB — POCT URINE PREGNANCY: Preg Test, Ur: POSITIVE — AB

## 2023-12-23 MED ORDER — PRENATAL PLUS 27-1 MG PO TABS: 1.0000 | ORAL_TABLET | Freq: Every day | ORAL | 12 refills | Status: DC

## 2023-12-23 NOTE — Progress Notes (Signed)
 Rx PNV ?

## 2023-12-23 NOTE — Progress Notes (Signed)
   NURSE VISIT- PREGNANCY CONFIRMATION   SUBJECTIVE:  Taylor Jefferson is a 23 y.o. G2P0 female at [redacted]w[redacted]d by certain LMP of Patient's last menstrual period was 11/17/2023. Here for pregnancy confirmation.  Home pregnancy test: positive x 2   She reports nausea.  She is not taking prenatal vitamins.    OBJECTIVE:  BP 125/75 (BP Location: Right Arm, Patient Position: Sitting, Cuff Size: Normal)   Pulse 83   LMP 11/17/2023   Appears well, in no apparent distress  Results for orders placed or performed in visit on 12/23/23 (from the past 24 hours)  POCT urine pregnancy   Collection Time: 12/23/23  9:07 AM  Result Value Ref Range   Preg Test, Ur Positive (A) Negative    ASSESSMENT: Positive pregnancy test, [redacted]w[redacted]d by LMP    PLAN: Schedule for dating ultrasound in 2 weeks Prenatal vitamins: note routed to Cyril Mourning to send prescription   Nausea medicines: not currently needed   OB packet given: Yes  Annamarie Dawley  12/23/2023 9:10 AM

## 2024-01-02 ENCOUNTER — Other Ambulatory Visit: Payer: Self-pay | Admitting: Adult Health

## 2024-01-02 MED ORDER — VALACYCLOVIR HCL 1 G PO TABS: 1000.0000 mg | ORAL_TABLET | Freq: Two times a day (BID) | ORAL | 12 refills | Status: DC

## 2024-01-02 MED ORDER — ONDANSETRON HCL 4 MG PO TABS: 4.0000 mg | ORAL_TABLET | Freq: Three times a day (TID) | ORAL | 1 refills | Status: DC | PRN

## 2024-01-02 NOTE — Progress Notes (Signed)
 Rx zofran, refilled valtrex

## 2024-01-06 ENCOUNTER — Other Ambulatory Visit: Payer: Self-pay | Admitting: Obstetrics & Gynecology

## 2024-01-06 DIAGNOSIS — O3680X0 Pregnancy with inconclusive fetal viability, not applicable or unspecified: Secondary | ICD-10-CM

## 2024-01-12 ENCOUNTER — Other Ambulatory Visit (HOSPITAL_COMMUNITY)
Admission: RE | Admit: 2024-01-12 | Discharge: 2024-01-12 | Disposition: A | Discharge status: Home / Self Care | Source: Ambulatory Visit | Attending: Obstetrics & Gynecology | Admitting: Obstetrics & Gynecology

## 2024-01-12 ENCOUNTER — Ambulatory Visit: Admitting: Radiology

## 2024-01-12 ENCOUNTER — Encounter: Payer: Self-pay | Admitting: Radiology

## 2024-01-12 ENCOUNTER — Ambulatory Visit: Admitting: *Deleted

## 2024-01-12 DIAGNOSIS — Z3491 Encounter for supervision of normal pregnancy, unspecified, first trimester: Secondary | ICD-10-CM

## 2024-01-12 DIAGNOSIS — N898 Other specified noninflammatory disorders of vagina: Secondary | ICD-10-CM

## 2024-01-12 DIAGNOSIS — Z3A08 8 weeks gestation of pregnancy: Secondary | ICD-10-CM | POA: Diagnosis not present

## 2024-01-12 DIAGNOSIS — O3680X0 Pregnancy with inconclusive fetal viability, not applicable or unspecified: Secondary | ICD-10-CM

## 2024-01-12 NOTE — Progress Notes (Signed)
 [place]: GA = 105w1d by LMP of 11-16-23 Anteverted uterus with single viable early IUP, GS intact, CRL = 17.1 mm = 8+1 weeks FHR = 160 bpm,  Yolk Sac = 5.12 mm,  normal ovaries, neg adnexal regions,  neg CDS, no Free fluid present EDD by LMP = 08-22-24

## 2024-01-12 NOTE — Progress Notes (Signed)
   NURSE VISIT- VAGINITIS/STD/POC  SUBJECTIVE:  Taylor Jefferson is a 23 y.o. G1P0 [redacted]w[redacted]d pregnantfemale here for a vaginal swab for vaginitis screening.  She reports the following symptoms: vulvar itching for several days. Denies abnormal vaginal bleeding, significant pelvic pain, fever, or UTI symptoms.  OBJECTIVE:  LMP 11/16/2023 (Exact Date)   Appears well, in no apparent distress  ASSESSMENT: Vaginal swab for vaginitis screening  PLAN: Self-collected vaginal probe for Bacterial Vaginosis, Yeast sent to lab Treatment: to be determined once results are received Follow-up as needed if symptoms persist/worsen, or new symptoms develop  Annamarie Dawley  01/12/2024 4:04 PM

## 2024-01-13 LAB — CERVICOVAGINAL ANCILLARY ONLY
Bacterial Vaginitis (gardnerella): POSITIVE — AB
Candida Glabrata: NEGATIVE
Candida Vaginitis: POSITIVE — AB
Comment: NEGATIVE
Comment: NEGATIVE
Comment: NEGATIVE

## 2024-01-14 ENCOUNTER — Other Ambulatory Visit: Payer: Self-pay | Admitting: Adult Health

## 2024-01-14 MED ORDER — METRONIDAZOLE 500 MG PO TABS: 500.0000 mg | ORAL_TABLET | Freq: Two times a day (BID) | ORAL | 0 refills | Status: DC

## 2024-01-30 ENCOUNTER — Other Ambulatory Visit: Payer: Self-pay

## 2024-01-30 ENCOUNTER — Emergency Department (HOSPITAL_COMMUNITY)
Admission: EM | Admit: 2024-01-30 | Discharge: 2024-01-30 | Disposition: A | Discharge status: Home / Self Care | Attending: Emergency Medicine | Admitting: Emergency Medicine

## 2024-01-30 ENCOUNTER — Encounter (HOSPITAL_COMMUNITY): Payer: Self-pay | Admitting: Emergency Medicine

## 2024-01-30 DIAGNOSIS — O209 Hemorrhage in early pregnancy, unspecified: Secondary | ICD-10-CM | POA: Diagnosis present

## 2024-01-30 DIAGNOSIS — Z3A10 10 weeks gestation of pregnancy: Secondary | ICD-10-CM | POA: Diagnosis not present

## 2024-01-30 LAB — CBC
HCT: 42.6 % (ref 36.0–46.0)
Hemoglobin: 13.8 g/dL (ref 12.0–15.0)
MCH: 29.6 pg (ref 26.0–34.0)
MCHC: 32.4 g/dL (ref 30.0–36.0)
MCV: 91.4 fL (ref 80.0–100.0)
Platelets: 176 10*3/uL (ref 150–400)
RBC: 4.66 MIL/uL (ref 3.87–5.11)
RDW: 12.2 % (ref 11.5–15.5)
WBC: 5.9 10*3/uL (ref 4.0–10.5)
nRBC: 0 % (ref 0.0–0.2)

## 2024-01-30 LAB — BASIC METABOLIC PANEL WITH GFR
Anion gap: 11 (ref 5–15)
BUN: 12 mg/dL (ref 6–20)
CO2: 22 mmol/L (ref 22–32)
Calcium: 9.1 mg/dL (ref 8.9–10.3)
Chloride: 101 mmol/L (ref 98–111)
Creatinine, Ser: 0.62 mg/dL (ref 0.44–1.00)
GFR, Estimated: >60 mL/min (ref >60)
Glucose, Bld: 79 mg/dL (ref 70–99)
Potassium: 3.5 mmol/L (ref 3.5–5.1)
Sodium: 134 mmol/L — ABNORMAL LOW (ref 135–145)

## 2024-01-30 LAB — HCG, QUANTITATIVE, PREGNANCY: hCG, Beta Chain, Quant, S: 101966 m[IU]/mL — ABNORMAL HIGH (ref <5)

## 2024-01-30 NOTE — Discharge Instructions (Addendum)
 Your exam and labs today are reassuring, as discussed spotting early in pregnancy can be normal, but also can be early signs of a miscarriage.  Plan to follow-up with your gynecologist as you have scheduled.

## 2024-01-30 NOTE — ED Triage Notes (Signed)
 Pt to ER with c/o vaginal bleeding intermittently for last 1-2 weeks.  Pt states is only spotting and does not require pad.

## 2024-01-31 NOTE — ED Provider Notes (Signed)
  EMERGENCY DEPARTMENT AT [facility] Provider Note   CSN: [id number] Arrival date & time: 01/30/24  1851     History  Chief Complaint  Patient presents with   Vaginal Bleeding    Taylor Jefferson is a 23 y.o. female who is pregnant, currently 10 weeks 6 days gestation given ultrasound completed 01/15/2024, [facility], presenting for evaluation of vaginal bleeding.  She describes seeing pink tinge of blood on her toilet tissue after urinating twice, once a day and once yesterday.  She denies pelvic pain, cramping, also denies dysuria, fevers chills or abdominal pain.  She has no physical complaints but wanted to get checked out.  Denies vaginal discharge.  Has had some mild nausea without emesis.  The history is provided by the patient.       Home Medications Prior to Admission medications   Medication Sig Start Date End Date Taking? Authorizing Provider  metroNIDAZOLE (FLAGYL) 500 MG tablet Take 1 tablet (500 mg total) by mouth 2 (two) times daily. 01/14/24   Javan Messing, NP  ondansetron (ZOFRAN) 4 MG tablet Take 1 tablet (4 mg total) by mouth every 8 (eight) hours as needed. 01/02/24   Javan Messing, NP  prenatal vitamin w/FE, FA (PRENATAL 1 + 1) 27-1 MG TABS tablet Take 1 tablet by mouth daily at 12 noon. 12/23/23   Javan Messing, NP  valACYclovir (VALTREX) 1000 MG tablet Take 1 tablet (1,000 mg total) by mouth 2 (two) times daily. 01/02/24   Javan Messing, NP      Allergies    Other    Review of Systems   Review of Systems  Constitutional:  Negative for fever.  HENT:  Negative for congestion and sore throat.   Eyes: Negative.   Respiratory:  Negative for chest tightness and shortness of breath.   Cardiovascular:  Negative for chest pain.  Gastrointestinal:  Negative for abdominal pain and nausea.  Genitourinary:  Positive for vaginal bleeding. Negative for dysuria, pelvic pain, vaginal discharge and vaginal pain.   Musculoskeletal:  Negative for arthralgias, joint swelling and neck pain.  Skin: Negative.  Negative for rash and wound.  Neurological:  Negative for dizziness, weakness, light-headedness, numbness and headaches.  Psychiatric/Behavioral: Negative.      Physical Exam Updated Vital Signs BP 120/76   Pulse 77   Temp 97.7 F (36.5 C) (Oral)   Resp 16   Ht 5\' 2"  (1.575 m)   Wt 52.2 kg   LMP 11/16/2023 (Exact Date)   SpO2 100%   BMI 21.03 kg/m  Physical Exam Vitals and nursing note reviewed.  Constitutional:      Appearance: She is well-developed.  HENT:     Head: Normocephalic and atraumatic.  Eyes:     Conjunctiva/sclera: Conjunctivae normal.  Cardiovascular:     Rate and Rhythm: Normal rate and regular rhythm.     Heart sounds: Normal heart sounds.  Pulmonary:     Effort: Pulmonary effort is normal.     Breath sounds: Normal breath sounds. No wheezing.  Abdominal:     General: Bowel sounds are normal.     Palpations: Abdomen is soft.     Tenderness: There is no abdominal tenderness. There is no guarding.  Musculoskeletal:        General: Normal range of motion.     Cervical back: Normal range of motion.  Skin:    General: Skin is warm and dry.  Neurological:  Mental Status: She is alert.     ED Results / Procedures / Treatments   Labs (all labs ordered are listed, but only abnormal results are displayed) Labs Reviewed  HCG, QUANTITATIVE, PREGNANCY - Abnormal; Notable for the following components:      Result Value   hCG, Beta Chain, Quant, S 101,966 (*)    All other components within normal limits  BASIC METABOLIC PANEL WITH GFR - Abnormal; Notable for the following components:   Sodium 134 (*)    All other components within normal limits  CBC    EKG None  Radiology No results found.  Procedures Procedures    Medications Ordered in ED Medications - No data to display  ED Course/ Medical Decision Making/ A&P                                  Medical Decision Making Patient presenting with mild spotting early in pregnancy, no pelvic pain, no other complaints or symptoms.  Suspect this is benign spotting but we did discuss that we cannot guarantee she will not have a miscarriage, her labs today however are reassuring with a healthy hCG quant.  She has a normal exam, without pelvic pain, fetal heart tones are reassuring with a rate of 173.  She was encouraged close follow-up with her OB/GYN for any new or worsening concerns, strict return precautions were also  Amount and/or Complexity of Data Reviewed Labs: ordered.    Details: Beta-hCG consistent with gestational age, be met in CBC are also reassuring           Final Clinical Impression(s) / ED Diagnoses Final diagnoses:  Vaginal bleeding in pregnancy, first trimester    Rx / DC Orders ED Discharge Orders     None         Alyse July 01/31/24 2257    Cheyenne Cotta, MD 02/02/24 1135

## 2024-02-04 ENCOUNTER — Other Ambulatory Visit: Payer: Self-pay | Admitting: Obstetrics & Gynecology

## 2024-02-04 DIAGNOSIS — Z3682 Encounter for antenatal screening for nuchal translucency: Secondary | ICD-10-CM

## 2024-02-09 ENCOUNTER — Encounter: Admitting: *Deleted

## 2024-02-09 ENCOUNTER — Other Ambulatory Visit (HOSPITAL_COMMUNITY)
Admission: RE | Admit: 2024-02-09 | Discharge: 2024-02-09 | Disposition: A | Discharge status: Home / Self Care | Source: Ambulatory Visit | Attending: Women's Health | Admitting: Women's Health

## 2024-02-09 ENCOUNTER — Ambulatory Visit

## 2024-02-09 ENCOUNTER — Ambulatory Visit (INDEPENDENT_AMBULATORY_CARE_PROVIDER_SITE_OTHER): Admitting: Women's Health

## 2024-02-09 ENCOUNTER — Encounter: Payer: Self-pay | Admitting: Women's Health

## 2024-02-09 VITALS — BP 128/90 | HR 94 | Wt 111.2 lb

## 2024-02-09 DIAGNOSIS — Z3490 Encounter for supervision of normal pregnancy, unspecified, unspecified trimester: Secondary | ICD-10-CM | POA: Insufficient documentation

## 2024-02-09 DIAGNOSIS — Z3682 Encounter for antenatal screening for nuchal translucency: Secondary | ICD-10-CM

## 2024-02-09 DIAGNOSIS — Z1332 Encounter for screening for maternal depression: Secondary | ICD-10-CM | POA: Diagnosis not present

## 2024-02-09 DIAGNOSIS — Z3481 Encounter for supervision of other normal pregnancy, first trimester: Secondary | ICD-10-CM

## 2024-02-09 DIAGNOSIS — Z124 Encounter for screening for malignant neoplasm of cervix: Secondary | ICD-10-CM | POA: Diagnosis present

## 2024-02-09 DIAGNOSIS — Z113 Encounter for screening for infections with a predominantly sexual mode of transmission: Secondary | ICD-10-CM | POA: Diagnosis present

## 2024-02-09 DIAGNOSIS — Z3A12 12 weeks gestation of pregnancy: Secondary | ICD-10-CM | POA: Diagnosis not present

## 2024-02-09 DIAGNOSIS — Z3401 Encounter for supervision of normal first pregnancy, first trimester: Secondary | ICD-10-CM

## 2024-02-09 DIAGNOSIS — R030 Elevated blood-pressure reading, without diagnosis of hypertension: Secondary | ICD-10-CM

## 2024-02-09 DIAGNOSIS — Z1379 Encounter for other screening for genetic and chromosomal anomalies: Secondary | ICD-10-CM

## 2024-02-09 DIAGNOSIS — O0990 Supervision of high risk pregnancy, unspecified, unspecified trimester: Secondary | ICD-10-CM | POA: Insufficient documentation

## 2024-02-09 DIAGNOSIS — B009 Herpesviral infection, unspecified: Secondary | ICD-10-CM

## 2024-02-09 MED ORDER — ONDANSETRON 4 MG PO TBDP: 4.0000 mg | ORAL_TABLET | Freq: Three times a day (TID) | ORAL | 3 refills | Status: DC | PRN

## 2024-02-09 MED ORDER — ASPIRIN 81 MG PO TBEC: 162.0000 mg | DELAYED_RELEASE_TABLET | Freq: Every day | ORAL | 2 refills | Status: DC

## 2024-02-09 NOTE — Progress Notes (Signed)
 US  12+1 wks,measurements c/w dates,FHR 173 bpm,NT 1.1 mm,NB present,posterior placenta,CRL 56.79 mm,normal ovaries

## 2024-02-09 NOTE — Progress Notes (Signed)
 INITIAL OBSTETRICAL VISIT Patient name: Taylor Jefferson MRN [id number]  Date of birth: 02-26-2001 Chief Complaint:   Initial Prenatal Visit  History of Present Illness:   Taylor Jefferson is a 23 y.o. G67P0010 African-American female at [redacted]w[redacted]d by LMP c/w u/s at 8 weeks with an Estimated Date of Delivery: 08/22/24 being seen today for her initial obstetrical visit.   Patient's last menstrual period was 11/16/2023 (exact date). Her obstetrical history is significant for  SAB x 1 .   Today she reports nausea-requests zofran  odt (regular zofran  not helping and has taken her mom's odt and it worked well).  Dep/anx- stopped lexapro  w/ +PT, feels like she needs to restart but wants to talk to her psychiatrist first (sees [facility]), has therapy appt next week. Denies SI.  BP elevated, reports she is mad Last pap never. Results were: N/A     02/09/2024    9:06 AM 05/16/2021    4:53 AM 12/05/2020    1:19 PM 01/31/2020   11:34 AM  Depression screen PHQ 2/9  Decreased Interest 1 2  0  Down, Depressed, Hopeless 0 2  0  PHQ - 2 Score 1 4  0  Altered sleeping 0 1  3  Tired, decreased energy 1 2  2   Change in appetite 1 0  2  Feeling bad or failure about yourself  0 2  0  Trouble concentrating 0 1  0  Moving slowly or fidgety/restless 0 0  1  Suicidal thoughts 0 1    PHQ-9 Score 3 11  8   Difficult doing work/chores  Very difficult       Information is confidential and restricted. Go to Review Flowsheets to unlock data.        02/09/2024    9:06 AM  GAD 7 : Generalized Anxiety Score  Nervous, Anxious, on Edge 0  Control/stop worrying 0  Worry too much - different things 0  Trouble relaxing 0  Restless 0  Easily annoyed or irritable 0  Afraid - awful might happen 0  Total GAD 7 Score 0     Review of Systems:   Pertinent items are noted in HPI Denies cramping/contractions, leakage of fluid, vaginal bleeding, abnormal vaginal discharge w/ itching/odor/irritation, headaches, visual  changes, shortness of breath, chest pain, abdominal pain, severe nausea/vomiting, or problems with urination or bowel movements unless otherwise stated above.  Pertinent History Reviewed:  Reviewed past medical,surgical, social, obstetrical and family history.  Reviewed problem list, medications and allergies. OB History  Gravida Para Term Preterm AB Living  2    1   SAB IAB Ectopic Multiple Live Births  1        # Outcome Date GA Lbr Len/2nd Weight Sex Type Anes PTL Lv  2 Current           1 SAB            Physical Assessment:   Vitals:   02/09/24 0925 02/09/24 0941  BP: (!) 133/93 (!) 128/90  Pulse: 90 94  Weight: 111 lb 3.2 oz (50.4 kg)   Body mass index is 20.34 kg/m.       Physical Examination:  General appearance - well appearing, and in no distress  Mental status - alert, oriented to person, place, and time  Psych:  She has a normal mood and affect  Skin - warm and dry, normal color, no suspicious lesions noted  Chest - effort normal, all lung fields clear to auscultation  bilaterally  Heart - normal rate and regular rhythm  Abdomen - soft, nontender  Extremities:  No swelling or varicosities noted  Pelvic - VULVA: normal appearing vulva with no masses, tenderness or lesions  VAGINA: normal appearing vagina with normal color and discharge, no lesions  CERVIX: normal appearing cervix without discharge or lesions, no CMT  Thin prep pap is done w/ reflex HR HPV cotesting  Chaperone: Lorean Rodes  TODAY'S NT US  12+1 wks,measurements c/w dates,FHR 173 bpm,NT 1.1 mm,NB present,posterior placenta,CRL 56.79 mm,normal ovaries   No results found for this or any previous visit (from the past 24 hours).  Assessment & Plan:  1) Low-Risk Pregnancy G2P0010 at [redacted]w[redacted]d with an Estimated Date of Delivery: 08/22/24   2) Initial OB visit  3) Elevated bp> no h/o HTN, states she is mad, review of chart w/ normal bp's. Start ASA, get baseline labs  4) Dep/anx> stopped lexapro  w/ +PT,  feels like she needs to get back on- offered today but wants to talk to her psychiatrist [facility]), to call and schedule appt. 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 Swallow whole.    Dispense:  180 tablet    Refill:  2   ondansetron  (ZOFRAN -ODT) 4 MG disintegrating tablet    Sig: Take 1 tablet (4 mg total) by mouth every 8 (eight) hours as needed for nausea or vomiting.    Dispense:  20 tablet    Refill:  3    Initial labs obtained Continue prenatal vitamins Reviewed n/v relief measures and warning s/s to report Reviewed recommended weight gain based on pre-gravid BMI Encouraged well-balanced diet Genetic & carrier screening discussed: requests Panorama, NT/IT, and Horizon  Ultrasound discussed; fetal survey: requested CCNC completed> form faxed if has or is planning to apply for medicaid The nature of [employer] for [employer] with multiple MDs and other Advanced Practice Providers was explained to patient; also emphasized that fellows, residents, and students are part of our team. Does have home bp cuff. Office bp cuff given: no. Rx sent: n/a. Check bp weekly, let us  know if consistently >140/90.   Follow-up: Return in about 4 weeks (around 03/08/2024) for LROB, 2nd IT, CNM, in person; then 7wks from now anatomy u/s and LROB w/ CNM.   Orders Placed This Encounter  Procedures   Urine Culture   CBC/D/Plt+RPR+Rh+ABO+RubIgG...   Integrated 1   HgB A1c   HORIZON CUSTOM   PANORAMA PRENATAL TEST   Comprehensive metabolic panel with GFR   Protein / creatinine ratio, urine    Ferd Householder CNM, [facility] 02/09/2024 10:05 AM

## 2024-02-09 NOTE — Patient Instructions (Signed)
Taylor Jefferson, thank you for choosing our office today! We appreciate the opportunity to meet your healthcare needs. You may receive a short survey by mail, e-mail, or through [employer]. If you are happy with your care we would appreciate if you could take just a few minutes to complete the survey questions. We read all of your comments and take your feedback very seriously. Thank you again for choosing our office.  Center for [employer] Healthcare Team at [facility]  [facility] at [facility] ([address] [place], [place] [address]) Entrance C, located off of [address] Free 24/7 valet parking   Nausea & Vomiting Have saltine crackers or pretzels by your bed and eat a few bites before you raise your head out of bed in the morning Eat small frequent meals throughout the day instead of large meals Drink plenty of fluids throughout the day to stay hydrated, just don't drink a lot of fluids with your meals.  This can make your stomach fill up faster making you feel sick Do not brush your teeth right after you eat Products with real ginger are good for nausea, like ginger ale and ginger hard candy Make sure it says made with real ginger! Sucking on sour candy like lemon heads is also good for nausea If your prenatal vitamins make you nauseated, take them at night so you will sleep through the nausea Sea Bands If you feel like you need medicine for the nausea & vomiting please let us know If you are unable to keep any fluids or food down please let us know   Constipation Drink plenty of fluid, preferably water, throughout the day Eat foods high in fiber such as fruits, vegetables, and grains Exercise, such as walking, is a good way to keep your bowels regular Drink warm fluids, especially warm prune juice, or decaf coffee Eat a 1/2 cup of real oatmeal (not instant), 1/2 cup applesauce, and 1/2-1 cup warm prune juice every day If needed, you may take Colace (docusate sodium) stool softener  once or twice a day to help keep the stool soft.  If you still are having problems with constipation, you may take Miralax once daily as needed to help keep your bowels regular.   Home Blood Pressure Monitoring for Patients   Your provider has recommended that you check your blood pressure (BP) at least once a week at home. If you do not have a blood pressure cuff at home, one will be provided for you. Contact your provider if you have not received your monitor within 1 week.   Helpful Tips for Accurate Home Blood Pressure Checks  Don't smoke, exercise, or drink caffeine 30 minutes before checking your BP Use the restroom before checking your BP (a full bladder can raise your pressure) Relax in a comfortable upright chair Feet on the ground Left arm resting comfortably on a flat surface at the level of your heart Legs uncrossed Back supported Sit quietly and don't talk Place the cuff on your bare arm Adjust snuggly, so that only two fingertips can fit between your skin and the top of the cuff Check 2 readings separated by at least one minute Keep a log of your BP readings For a visual, please reference this diagram: http://ccnc.care/bpdiagram  Provider Name: [facility]     Phone: [phone]  Zone 1: ALL CLEAR  Continue to monitor your symptoms:  BP reading is less than 140 (top number) or less than 90 (bottom  number)  No right upper stomach pain No headaches or seeing spots No feeling nauseated or throwing up No swelling in face and hands  Zone 2: CAUTION Call your doctor's office for any of the following:  BP reading is greater than 140 (top number) or greater than 90 (bottom number)  Stomach pain under your ribs in the middle or right side Headaches or seeing spots Feeling nauseated or throwing up Swelling in face and hands  Zone 3: EMERGENCY  Seek immediate medical care if you have any of the following:  BP reading is greater than160 (top number) or greater than  110 (bottom number) Severe headaches not improving with Tylenol Serious difficulty catching your breath Any worsening symptoms from Zone 2    First Trimester of Pregnancy The first trimester of pregnancy is from week 1 until the end of week 12 (months 1 through 3). A week after a sperm fertilizes an egg, the egg will implant on the wall of the uterus. This embryo will begin to develop into a baby. Genes from you and your partner are forming the baby. The female genes determine whether the baby is a boy or a girl. At 6-8 weeks, the eyes and face are formed, and the heartbeat can be seen on ultrasound. At the end of 12 weeks, all the baby's organs are formed.  Now that you are pregnant, you will want to do everything you can to have a healthy baby. Two of the most important things are to get good prenatal care and to follow your health care provider's instructions. Prenatal care is all the medical care you receive before the baby's birth. This care will help prevent, find, and treat any problems during the pregnancy and childbirth. BODY CHANGES Your body goes through many changes during pregnancy. The changes vary from woman to woman.  You may gain or lose a couple of pounds at first. You may feel sick to your stomach (nauseous) and throw up (vomit). If the vomiting is uncontrollable, call your health care provider. You may tire easily. You may develop headaches that can be relieved by medicines approved by your health care provider. You may urinate more often. Painful urination may mean you have a bladder infection. You may develop heartburn as a result of your pregnancy. You may develop constipation because certain hormones are causing the muscles that push waste through your intestines to slow down. You may develop hemorrhoids or swollen, bulging veins (varicose veins). Your breasts may begin to grow larger and become tender. Your nipples may stick out more, and the tissue that surrounds them  (areola) may become darker. Your gums may bleed and may be sensitive to brushing and flossing. Dark spots or blotches (chloasma, mask of pregnancy) may develop on your face. This will likely fade after the baby is born. Your menstrual periods will stop. You may have a loss of appetite. You may develop cravings for certain kinds of food. You may have changes in your emotions from day to day, such as being excited to be pregnant or being concerned that something may go wrong with the pregnancy and baby. You may have more vivid and strange dreams. You may have changes in your hair. These can include thickening of your hair, rapid growth, and changes in texture. Some women also have hair loss during or after pregnancy, or hair that feels dry or thin. Your hair will most likely return to normal after your baby is born. WHAT TO EXPECT AT YOUR PRENATAL  VISITS During a routine prenatal visit: You will be weighed to make sure you and the baby are growing normally. Your blood pressure will be taken. Your abdomen will be measured to track your baby's growth. The fetal heartbeat will be listened to starting around week 10 or 12 of your pregnancy. Test results from any previous visits will be discussed. Your health care provider may ask you: How you are feeling. If you are feeling the baby move. If you have had any abnormal symptoms, such as leaking fluid, bleeding, severe headaches, or abdominal cramping. If you have any questions. Other tests that may be performed during your first trimester include: Blood tests to find your blood type and to check for the presence of any previous infections. They will also be used to check for low iron levels (anemia) and Rh antibodies. Later in the pregnancy, blood tests for diabetes will be done along with other tests if problems develop. Urine tests to check for infections, diabetes, or protein in the urine. An ultrasound to confirm the proper growth and development  of the baby. An amniocentesis to check for possible genetic problems. Fetal screens for spina bifida and Down syndrome. You may need other tests to make sure you and the baby are doing well. HOME CARE INSTRUCTIONS  Medicines Follow your health care provider's instructions regarding medicine use. Specific medicines may be either safe or unsafe to take during pregnancy. Take your prenatal vitamins as directed. If you develop constipation, try taking a stool softener if your health care provider approves. Diet Eat regular, well-balanced meals. Choose a variety of foods, such as meat or vegetable-based protein, fish, milk and low-fat dairy products, vegetables, fruits, and whole grain breads and cereals. Your health care provider will help you determine the amount of weight gain that is right for you. Avoid raw meat and uncooked cheese. These carry germs that can cause birth defects in the baby. Eating four or five small meals rather than three large meals a day may help relieve nausea and vomiting. If you start to feel nauseous, eating a few soda crackers can be helpful. Drinking liquids between meals instead of during meals also seems to help nausea and vomiting. If you develop constipation, eat more high-fiber foods, such as fresh vegetables or fruit and whole grains. Drink enough fluids to keep your urine clear or pale yellow. Activity and Exercise Exercise only as directed by your health care provider. Exercising will help you: Control your weight. Stay in shape. Be prepared for labor and delivery. Experiencing pain or cramping in the lower abdomen or low back is a good sign that you should stop exercising. Check with your health care provider before continuing normal exercises. Try to avoid standing for long periods of time. Move your legs often if you must stand in one place for a long time. Avoid heavy lifting. Wear low-heeled shoes, and practice good posture. You may continue to have sex  unless your health care provider directs you otherwise. Relief of Pain or Discomfort Wear a good support bra for breast tenderness.   Take warm sitz baths to soothe any pain or discomfort caused by hemorrhoids. Use hemorrhoid cream if your health care provider approves.   Rest with your legs elevated if you have leg cramps or low back pain. If you develop varicose veins in your legs, wear support hose. Elevate your feet for 15 minutes, 3-4 times a day. Limit salt in your diet. Prenatal Care Schedule your prenatal visits by the  twelfth week of pregnancy. They are usually scheduled monthly at first, then more often in the last 2 months before delivery. Write down your questions. Take them to your prenatal visits. Keep all your prenatal visits as directed by your health care provider. Safety Wear your seat belt at all times when driving. Make a list of emergency phone numbers, including numbers for family, friends, the hospital, and police and fire departments. General Tips Ask your health care provider for a referral to a local prenatal education class. Begin classes no later than at the beginning of month 6 of your pregnancy. Ask for help if you have counseling or nutritional needs during pregnancy. Your health care provider can offer advice or refer you to specialists for help with various needs. Do not use hot tubs, steam rooms, or saunas. Do not douche or use tampons or scented sanitary pads. Do not cross your legs for long periods of time. Avoid cat litter boxes and soil used by cats. These carry germs that can cause birth defects in the baby and possibly loss of the fetus by miscarriage or stillbirth. Avoid all smoking, herbs, alcohol, and medicines not prescribed by your health care provider. Chemicals in these affect the formation and growth of the baby. Schedule a dentist appointment. At home, brush your teeth with a soft toothbrush and be gentle when you floss. SEEK MEDICAL CARE IF:   You have dizziness. You have mild pelvic cramps, pelvic pressure, or nagging pain in the abdominal area. You have persistent nausea, vomiting, or diarrhea. You have a bad smelling vaginal discharge. You have pain with urination. You notice increased swelling in your face, hands, legs, or ankles. SEEK IMMEDIATE MEDICAL CARE IF:  You have a fever. You are leaking fluid from your vagina. You have spotting or bleeding from your vagina. You have severe abdominal cramping or pain. You have rapid weight gain or loss. You vomit blood or material that looks like coffee grounds. You are exposed to [place] measles and have never had them. You are exposed to fifth disease or chickenpox. You develop a severe headache. You have shortness of breath. You have any kind of trauma, such as from a fall or a car accident. Document Released: 10/01/2001 Document Revised: 02/21/2014 Document Reviewed: 08/17/2013 [facility] Patient Information 2015 [place], [place]. This information is not intended to replace advice given to you by your health care provider. Make sure you discuss any questions you have with your health care provider.

## 2024-02-10 LAB — COMPREHENSIVE METABOLIC PANEL WITH GFR
ALT: 15 IU/L (ref 0–32)
AST: 19 IU/L (ref 0–40)
Albumin: 4.4 g/dL (ref 4.0–5.0)
Alkaline Phosphatase: 88 IU/L (ref 44–121)
BUN/Creatinine Ratio: 10 (ref 9–23)
BUN: 7 mg/dL (ref 6–20)
Bilirubin Total: 0.3 mg/dL (ref 0.0–1.2)
CO2: 20 mmol/L (ref 20–29)
Calcium: 9.7 mg/dL (ref 8.7–10.2)
Chloride: 103 mmol/L (ref 96–106)
Creatinine, Ser: 0.7 mg/dL (ref 0.57–1.00)
Globulin, Total: 3.2 g/dL (ref 1.5–4.5)
Glucose: 88 mg/dL (ref 70–99)
Potassium: 3.8 mmol/L (ref 3.5–5.2)
Sodium: 138 mmol/L (ref 134–144)
Total Protein: 7.6 g/dL (ref 6.0–8.5)
eGFR: 126 mL/min/{1.73_m2} (ref >59)

## 2024-02-10 LAB — INTEGRATED 1

## 2024-02-10 LAB — PROTEIN / CREATININE RATIO, URINE
Creatinine, Urine: <4.2 mg/dL
Protein, Ur: <4 mg/dL

## 2024-02-11 ENCOUNTER — Encounter: Payer: Self-pay | Admitting: Women's Health

## 2024-02-11 LAB — URINE CULTURE: Organism ID, Bacteria: NO GROWTH

## 2024-02-11 LAB — CYTOLOGY - PAP
Chlamydia: POSITIVE — AB
Comment: NEGATIVE
Comment: NORMAL
Diagnosis: NEGATIVE
Neisseria Gonorrhea: NEGATIVE

## 2024-02-11 MED ORDER — AZITHROMYCIN 500 MG PO TABS: 1000.0000 mg | ORAL_TABLET | Freq: Once | ORAL | 0 refills | Status: AC

## 2024-02-11 NOTE — Addendum Note (Signed)
 Addended by: Ferd Householder on: 02/11/2024 04:40 PM   Modules accepted: Orders

## 2024-02-17 ENCOUNTER — Encounter: Payer: Self-pay | Admitting: Women's Health

## 2024-02-17 LAB — PANORAMA PRENATAL TEST FULL PANEL:PANORAMA TEST PLUS 5 ADDITIONAL MICRODELETIONS: FETAL FRACTION: 5.7

## 2024-02-18 ENCOUNTER — Encounter (HOSPITAL_COMMUNITY): Payer: Self-pay | Admitting: Licensed Clinical Social Worker

## 2024-02-18 ENCOUNTER — Ambulatory Visit (HOSPITAL_COMMUNITY): Admitting: Licensed Clinical Social Worker

## 2024-02-18 DIAGNOSIS — F330 Major depressive disorder, recurrent, mild: Secondary | ICD-10-CM

## 2024-02-18 NOTE — Progress Notes (Signed)
 Virtual Visit via Video Note  I connected with Taylor Jefferson on 02/18/24 at  9:00 AM EDT by a video enabled telemedicine application and verified that I am speaking with the correct person using two identifiers.  Location: Patient: home Provider: home office   I discussed the limitations of evaluation and management by telemedicine and the availability of in person appointments. The patient expressed understanding and agreed to proceed.   I discussed the assessment and treatment plan with the patient. The patient was provided an opportunity to ask questions and all were answered. The patient agreed with the plan and demonstrated an understanding of the instructions.   The patient was advised to call back or seek an in-person evaluation if the symptoms worsen or if the condition fails to improve as anticipated.  I provided 40 minutes of non-face-to-face time during this encounter.   Seldon Dago, LCSW   THERAPIST PROGRESS NOTE  Session Time: 9:20-10:00am  Participation Level: Active  Behavioral Response: CasualAlertEuthymic  Type of Therapy: Individual Therapy  Treatment Goals addressed:  Goal: LTG: Reduce frequency, intensity, and duration of depression symptoms so that daily functioning is improved     Dates: Start:  10/21/23    Expected End:  04/19/24       Disciplines: Interdisciplinary, PROVIDER                    Goal: LTG: Increase coping skills to manage depression and improve ability to perform daily activities     Dates: Start:  10/21/23    Expected End:  04/19/24       Disciplines: Interdisciplinary, PROVIDER       ProgressTowards Goals: Progressing  Interventions: Supportive  Summary: Taylor Jefferson is a 23 y.o. female who presents with MDD, recurrent, mild.   Suicidal/Homicidal: Nowithout intent/plan  Therapist Response: Taylor Jefferson engaged well in individual virtual session with clinician.  Clinician utilized supportive counseling to process  thoughts feelings and experiences.  Clinician discussed updates and Taylor Jefferson's life, including pregnancy.  Clinician explored feelings about having a baby, disconnecting from the father, and improving relationship with mother.  Clinician discussed options for support.  Taylor Jefferson shared that she is very excited and so is her mother.  Clinician identified the importance of that relationship, especially once the baby comes.  Clinician explored mood and noted that the baby's father was extremely stressful to Taylor Jefferson, not supportive, and not interested in being a part at this time.  Taylor Jefferson shared that she was proud of herself for setting her boundary.  Plan: Return again in 4 weeks.  Diagnosis: MDD (major depressive disorder), recurrent episode, mild (HCC)  Collaboration of Care: Other provider involved in patient's care AEB reviewed notes re: pregnancy  Patient/Guardian was advised Release of Information must be obtained prior to any record release in order to collaborate their care with an outside provider. Patient/Guardian was advised if they have not already done so to contact the registration department to sign all necessary forms in order for us  to release information regarding their care.   Consent: Patient/Guardian gives verbal consent for treatment and assignment of benefits for services provided during this visit. Patient/Guardian expressed understanding and agreed to proceed.   Taylor Jefferson [place], LCSW 02/18/2024

## 2024-02-20 LAB — CBC/D/PLT+RPR+RH+ABO+RUBIGG...
Antibody Screen: NEGATIVE
Basophils Absolute: 0 10*3/uL (ref 0.0–0.2)
Basos: 0 %
EOS (ABSOLUTE): 0 10*3/uL (ref 0.0–0.4)
Eos: 0 %
HCV Ab: NONREACTIVE
HIV Screen 4th Generation wRfx: NONREACTIVE
Hematocrit: 40.7 % (ref 34.0–46.6)
Hemoglobin: 13.7 g/dL (ref 11.1–15.9)
Immature Grans (Abs): 0 10*3/uL (ref 0.0–0.1)
Immature Granulocytes: 0 %
Lymphocytes Absolute: 0.8 10*3/uL (ref 0.7–3.1)
Lymphs: 12 %
MCH: 29.7 pg (ref 26.6–33.0)
MCHC: 33.7 g/dL (ref 31.5–35.7)
MCV: 88 fL (ref 79–97)
Monocytes Absolute: 0.3 10*3/uL (ref 0.1–0.9)
Monocytes: 4 %
Neutrophils Absolute: 5.6 10*3/uL (ref 1.4–7.0)
Neutrophils: 84 %
Platelets: 167 10*3/uL (ref 150–450)
RBC: 4.61 x10E6/uL (ref 3.77–5.28)
RDW: 12.1 % (ref 11.7–15.4)
RPR Ser Ql: NONREACTIVE
Rh Factor: POSITIVE
Rubella Antibodies, IGG: 9.8 {index} (ref >0.99)
WBC: 6.7 10*3/uL (ref 3.4–10.8)

## 2024-02-20 LAB — HEMOGLOBIN A1C
Est. average glucose Bld gHb Est-mCnc: 103 mg/dL
Hgb A1c MFr Bld: 5.2 % (ref 4.8–5.6)

## 2024-02-20 LAB — INTEGRATED 1
Crown Rump Length: 56.8 mm
Gest. Age on Collection Date: 12 wk
Maternal Age at EDD: 21.9 a
Nuchal Translucency (NT): 1.1 mm
Number of Fetuses: 1
PAPP-A Value: 300.9 ng/mL
Sonographer ID#: 309760
Weight: 111 [lb_av]

## 2024-02-20 LAB — HEPATITIS B SURFACE AG, CONFIRM

## 2024-02-20 LAB — HCV INTERPRETATION

## 2024-02-24 ENCOUNTER — Encounter: Payer: Self-pay | Admitting: Women's Health

## 2024-02-25 ENCOUNTER — Ambulatory Visit: Admitting: *Deleted

## 2024-02-25 ENCOUNTER — Encounter: Payer: Self-pay | Admitting: Obstetrics & Gynecology

## 2024-02-25 VITALS — BP 129/81 | HR 107

## 2024-02-25 DIAGNOSIS — O99891 Other specified diseases and conditions complicating pregnancy: Secondary | ICD-10-CM

## 2024-02-25 DIAGNOSIS — Z3A14 14 weeks gestation of pregnancy: Secondary | ICD-10-CM

## 2024-02-25 DIAGNOSIS — M549 Dorsalgia, unspecified: Secondary | ICD-10-CM

## 2024-02-25 LAB — POCT URINALYSIS DIPSTICK OB
Blood, UA: NEGATIVE
Glucose, UA: NEGATIVE
Ketones, UA: NEGATIVE
Leukocytes, UA: NEGATIVE
Nitrite, UA: NEGATIVE
POC,PROTEIN,UA: NEGATIVE

## 2024-02-25 MED ORDER — NITROFURANTOIN MONOHYD MACRO 100 MG PO CAPS: 100.0000 mg | ORAL_CAPSULE | Freq: Two times a day (BID) | ORAL | 0 refills | Status: DC

## 2024-02-25 NOTE — Addendum Note (Signed)
 Addended by: Ferd Householder on: 02/25/2024 04:06 PM   Modules accepted: Orders

## 2024-02-25 NOTE — Progress Notes (Signed)
   NURSE VISIT- UTI SYMPTOMS   SUBJECTIVE:  Taylor Jefferson is a 23 y.o. G7P0010 female here for UTI symptoms. She is [redacted]w[redacted]d pregnant. She reports flank pain bilaterally and urinary frequency.  OBJECTIVE:  BP 129/81 (BP Location: Right Arm, Patient Position: Sitting, Cuff Size: Normal)   Pulse (!) 107   LMP 11/16/2023 (Exact Date)   Appears well, in no apparent distress  No results found for this or any previous visit (from the past 24 hours).  ASSESSMENT: Pregnancy [redacted]w[redacted]d with UTI symptoms and negative nitrites  PLAN: Discussed with Benny Braver, CNM, [facility]   Rx sent by provider today: Yes Urine culture sent Call or return to clinic prn if these symptoms worsen or fail to improve as anticipated. Follow-up: as scheduled   Kerrie Peek  02/25/2024 3:07 PM

## 2024-02-27 ENCOUNTER — Inpatient Hospital Stay (HOSPITAL_COMMUNITY)
Admission: AD | Admit: 2024-02-27 | Discharge: 2024-02-27 | Disposition: A | Discharge status: Home / Self Care | Attending: Obstetrics & Gynecology | Admitting: Obstetrics & Gynecology

## 2024-02-27 ENCOUNTER — Encounter (HOSPITAL_COMMUNITY): Payer: Self-pay | Admitting: Obstetrics & Gynecology

## 2024-02-27 DIAGNOSIS — O23592 Infection of other part of genital tract in pregnancy, second trimester: Secondary | ICD-10-CM | POA: Insufficient documentation

## 2024-02-27 DIAGNOSIS — B9689 Other specified bacterial agents as the cause of diseases classified elsewhere: Secondary | ICD-10-CM

## 2024-02-27 DIAGNOSIS — X58XXXA Exposure to other specified factors, initial encounter: Secondary | ICD-10-CM | POA: Diagnosis not present

## 2024-02-27 DIAGNOSIS — S39012A Strain of muscle, fascia and tendon of lower back, initial encounter: Secondary | ICD-10-CM | POA: Diagnosis not present

## 2024-02-27 DIAGNOSIS — Z3A14 14 weeks gestation of pregnancy: Secondary | ICD-10-CM | POA: Diagnosis not present

## 2024-02-27 DIAGNOSIS — N760 Acute vaginitis: Secondary | ICD-10-CM | POA: Diagnosis not present

## 2024-02-27 DIAGNOSIS — R109 Unspecified abdominal pain: Secondary | ICD-10-CM | POA: Diagnosis present

## 2024-02-27 DIAGNOSIS — O26899 Other specified pregnancy related conditions, unspecified trimester: Secondary | ICD-10-CM

## 2024-02-27 DIAGNOSIS — O26892 Other specified pregnancy related conditions, second trimester: Secondary | ICD-10-CM

## 2024-02-27 DIAGNOSIS — O9A212 Injury, poisoning and certain other consequences of external causes complicating pregnancy, second trimester: Secondary | ICD-10-CM | POA: Diagnosis not present

## 2024-02-27 LAB — URINALYSIS, ROUTINE W REFLEX MICROSCOPIC
Bilirubin Urine: NEGATIVE
Glucose, UA: NEGATIVE mg/dL
Hgb urine dipstick: NEGATIVE
Ketones, ur: NEGATIVE mg/dL
Nitrite: NEGATIVE
Protein, ur: NEGATIVE mg/dL
Specific Gravity, Urine: 1.016 (ref 1.005–1.030)
pH: 6 (ref 5.0–8.0)

## 2024-02-27 LAB — WET PREP, GENITAL
Sperm: NONE SEEN
Trich, Wet Prep: NONE SEEN
WBC, Wet Prep HPF POC: >=10 — AB (ref <10)
Yeast Wet Prep HPF POC: NONE SEEN

## 2024-02-27 LAB — URINE CULTURE

## 2024-02-27 MED ORDER — CYCLOBENZAPRINE HCL 5 MG PO TABS
5.0000 mg | ORAL_TABLET | Freq: Once | ORAL | Status: AC
  Administered 2024-02-27: 5 mg via ORAL
  Filled 2024-02-27: qty 1

## 2024-02-27 MED ORDER — CYCLOBENZAPRINE HCL 5 MG PO TABS: 10.0000 mg | ORAL_TABLET | Freq: Three times a day (TID) | ORAL | 2 refills | Status: DC | PRN

## 2024-02-27 MED ORDER — METRONIDAZOLE 0.75 % VA GEL: 1.0000 | Freq: Every day | VAGINAL | 0 refills | Status: DC

## 2024-02-27 NOTE — MAU Note (Signed)
 Pt says she went [facility] 1-2 mths ago- for cramping.   Became less but now worse since  530pm -9/10.   Took XS Tyl 2 tabs at 530pm- 9/10 (Also took 2 tabs at 0900- for back pain )  Now 7/10. [facility] - next appointment 5-19 Last sex- 1 mth - different partner than 4-23

## 2024-02-27 NOTE — MAU Provider Note (Signed)
 Chief Complaint: Abdominal Pain   Event Date/Time   First Provider Initiated Contact with Patient 02/27/24 2035      SUBJECTIVE HPI: Taylor Jefferson is a 23 y.o. G2P0010 at [redacted]w[redacted]d by LMP who presents to maternity admissions reporting cramping today.  Patient started to have lower abdominal cramping earlier this morning.  Rated it a 9 out of 10 but took Tylenol  and has moved down to 7 out of 10 pain.  She denies vaginal bleeding, loss of fluid, change in discharge, urinary symptoms.  She does note having lower back pain when carrying her heavy backpack intermittently as well.  She recently took treatment for chlamydia at the end of March.  Otherwise feels well.  Does have history of appendectomy approximately 1 month before getting pregnant.  HPI  Past Medical History:  Diagnosis Date   ADD (attention deficit disorder)    Allergy    Amenorrhea 11/02/2014   Anxiety    Asthma    Chlamydia 09/27/2019   tx 12/7, POC________   Contraceptive education 04/04/2014   Contraceptive management Apr 14, 2014   Death of parent 2020/08/06   Depression    HA (headache)    History of chlamydia 10/25/2019   Irregular periods 03/17/2019   Patellar pain    Dr. 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   Precocious puberty 10/08/2011   Reflux    Reflux    Seasonal allergies    Suicidal thoughts 01/06/2018   Vaginal burning 11/02/2014   Vaginal discharge 11/02/2014   Vaginal ulceration 04/04/2014   Will culture for HSV GC/CHL and a wound culture was obtained   Past Surgical History:  Procedure Laterality Date   APPENDECTOMY     XI ROBOTIC LAPAROSCOPIC ASSISTED APPENDECTOMY N/A 10/09/2023   Procedure: XI ROBOTIC LAPAROSCOPIC ASSISTED APPENDECTOMY;  Surgeon: Alanda Allegra, MD;  Location: [facility];  Service: General;  Laterality: N/A;   Social History   Socioeconomic History   Marital status: Single    Spouse name: Not on file   Number of children: Not on file   Years of education:  Not on file   Highest education level: Not on file  Occupational History   Not on file  Tobacco Use   Smoking status: Every Day    Types: Cigarettes    Passive exposure: Current   Smokeless tobacco: Never  Vaping Use   Vaping status: Every Day   Substances: Nicotine  Substance and Sexual Activity   Alcohol use: Yes    Comment: occassionally   Drug use: Not Currently    Types: Marijuana    Comment: daily   Sexual activity: Yes  Other Topics Concern   Not on file  Social History Narrative   Noraa is an 11th grade student at [employer].  She performs well in school.    Lives with mother and maternal grandmother. She has paternal half siblings that do not live in the home.   Social Drivers of Corporate investment banker Strain: Low Risk  (02/09/2024)   Overall Financial Resource Strain (CARDIA)    Difficulty of Paying Living Expenses: Not hard at all  Food Insecurity: No Food Insecurity (02/09/2024)   Hunger Vital Sign    Worried About Running Out of Food in the Last Year: Never true    Ran Out of Food in the Last Year: Never true  Transportation Needs: No Transportation Needs (02/09/2024)   PRAPARE - Administrator, Civil Service (Medical): No  Lack of Transportation (Non-Medical): No  Physical Activity: Inactive (02/09/2024)   Exercise Vital Sign    Days of Exercise per Week: 0 days    Minutes of Exercise per Session: 0 min  Stress: No Stress Concern Present (02/09/2024)   [employer] of Occupational Health - Occupational Stress Questionnaire    Feeling of Stress : Only a little  Social Connections: Moderately Isolated (02/09/2024)   Social Connection and Isolation Panel [NHANES]    Frequency of Communication with Friends and Family: More than three times a week    Frequency of Social Gatherings with Friends and Family: More than three times a week    Attends Religious Services: More than 4 times per year    Active Member of [employer] or  Organizations: No    Attends Banker Meetings: Never    Marital Status: Never married  Intimate Partner Violence: Not At Risk (02/09/2024)   Humiliation, Afraid, Rape, and Kick questionnaire    Fear of Current or Ex-Partner: No    Emotionally Abused: No    Physically Abused: No    Sexually Abused: No   No current facility-administered medications on file prior to encounter.   Current Outpatient Medications on File Prior to Encounter  Medication Sig Dispense Refill   aspirin  EC 81 MG tablet Take 2 tablets (162 mg total) by mouth daily. Swallow whole. 180 tablet 2   ondansetron  (ZOFRAN -ODT) 4 MG disintegrating tablet Take 1 tablet (4 mg total) by mouth every 8 (eight) hours as needed for nausea or vomiting. 20 tablet 3   prenatal vitamin w/FE, FA (PRENATAL 1 + 1) 27-1 MG TABS tablet Take 1 tablet by mouth daily at 12 noon. 30 tablet 12   valACYclovir  (VALTREX ) 1000 MG tablet Take 1 tablet (1,000 mg total) by mouth 2 (two) times daily. 30 tablet 12   Allergies  Allergen Reactions   Other Other (See Comments)    Seasonal - runny nose, itchy eyes    ROS:  Pertinent positives/negatives listed above.  I have reviewed patient's Past Medical Hx, Surgical Hx, Family Hx, Social Hx, medications and allergies.   Physical Exam  Patient Vitals for the past 24 hrs:  BP Temp Temp src Pulse Resp Height Weight  02/27/24 2014 117/79 97.9 F (36.6 C) Oral 84 14 5\' 2"  (1.575 m) 49.9 kg   Constitutional: Well-developed, well-nourished female in no acute distress Cardiovascular: normal rate Respiratory: normal effort GI: Abd soft, non-tender. Pos BS x 4 MS: Extremities nontender, no edema, normal ROM Neurologic: Alert and oriented x 4 GU: Neg CVAT  Doppler: 166  LAB RESULTS Results for orders placed or performed during the hospital encounter of 02/27/24 (from the past 24 hours)  Wet prep, genital     Status: Abnormal   Collection Time: 02/27/24  8:28 PM  Result Value Ref Range    Yeast Wet Prep HPF POC NONE SEEN NONE SEEN   Trich, Wet Prep NONE SEEN NONE SEEN   Clue Cells Wet Prep HPF POC PRESENT (A) NONE SEEN   WBC, Wet Prep HPF POC >=10 (A) <10   Sperm NONE SEEN   Urinalysis, Routine w reflex microscopic -Urine, Clean Catch     Status: Abnormal   Collection Time: 02/27/24  8:29 PM  Result Value Ref Range   Color, Urine YELLOW YELLOW   APPearance CLOUDY (A) CLEAR   Specific Gravity, Urine 1.016 1.005 - 1.030   pH 6.0 5.0 - 8.0   Glucose, UA NEGATIVE NEGATIVE mg/dL  Hgb urine dipstick NEGATIVE NEGATIVE   Bilirubin Urine NEGATIVE NEGATIVE   Ketones, ur NEGATIVE NEGATIVE mg/dL   Protein, ur NEGATIVE NEGATIVE mg/dL   Nitrite NEGATIVE NEGATIVE   Leukocytes,Ua TRACE (A) NEGATIVE   RBC / HPF 0-5 0 - 5 RBC/hpf   WBC, UA 0-5 0 - 5 WBC/hpf   Bacteria, UA RARE (A) NONE SEEN   Squamous Epithelial / HPF 11-20 0 - 5 /HPF   Mucus PRESENT     B/Positive/-- (04/21 1117)  IMAGING US  Fetal Nuchal Translucency Measurement Result Date: 02/09/2024 Table formatting from the original result was not included. Images from the original result were not included.  ..an Financial trader of Ultrasound Medicine Technical sales engineer) and nuchal translucency accredited practice Center for [facility] [address] [place] [address]         Ordering Provider: Ozan, Jennifer, DO                                                                                                                                                                                               NUCHAL TRANSLUCENCY FOR INTEGRATED SCREEN Aedyn T Dooner is in the office for nuchal translucency sonogram as part of an integrated screen. She is a 23 y.o. year old G1P0 with Estimated Date of Delivery: 08/22/24 by LMP now at  [redacted]w[redacted]d weeks gestation. Thus far the pregnancy has been uncomplicated. GESTATION: SINGLETON FETAL ACTIVITY:          Heart rate         173          The fetus is active. AMNIOTIC FLUID: The  amniotic fluid volume is  normal visually PLACENTA LOCALIZATION:  posterior GRADE 0 CERVIX: Measures appears closed ADNEXA: The ovaries are normal. GESTATIONAL AGE AND  BIOMETRICS: Gestational criteria: Estimated Date of Delivery: 08/22/24 by LMP now at [redacted]w[redacted]d Previous Scans:1                      CROWN RUMP LENGTH           56.79 mm         12+2 weeks NUCHAL TRANSLUCENCY           1.1 mm         normal  AVERAGE EGA(BY THIS SCAN):  12+1 weeks The fetal nasal bone is identified.  TECHNICIAN COMMENTS: US  12+1 wks,measurements c/w dates,FHR 173 bpm,NT 1.1 mm,NB present,posterior placenta,CRL 56.79 mm,normal ovaries The patient will have the first blood draw of her integrated screening today and the second draw in approximately 4 weeks. Amber Cathy Cobbs 02/09/2024 8:58 AM Clinical Impression and recommendations: I have reviewed the sonogram results above, combined with the patient's current clinical course, below are my impressions and any appropriate recommendations for management based on the sonographic findings. 1.  G2P0010  Estimated Date of Delivery: Estimated Date of Delivery: 08/22/24 by  LMP and confirmed by today's sonographic dating 2.  Normal fetal sonographic findings, specifically normal nuchal translucency and present fetal nasal bone Additionally the cranium, both choroid plexuses, zygomatic arch, mandible, 4 equal extremities, stomach, bladder and anterior abdomen are all noted. 3.  Normal general sonographic findings Recommend routine prenatal care based on this sonogram or as clinically indicated Christel Cousins                                                    MAU Management/MDM: Orders Placed This Encounter  Procedures   Wet prep, genital   Urinalysis, Routine w reflex microscopic -Urine, Clean Catch   Discharge patient    Meds ordered this encounter  Medications   cyclobenzaprine (FLEXERIL) tablet 5 mg   metroNIDAZOLE  (METROGEL )  0.75 % vaginal gel    Sig: Place 1 Applicatorful vaginally at bedtime.    Dispense:  70 g    Refill:  0   cyclobenzaprine (FLEXERIL) 5 MG tablet    Sig: Take 2 tablets (10 mg total) by mouth 3 (three) times daily as needed for muscle spasms.    Dispense:  30 tablet    Refill:  2    Patient presents with lower abdominal cramping onset today at [redacted]w[redacted]d pregnant.  She is most concerned about a miscarriage.  Reassurance provided that there are normal fetal heart tones and she is not having any bleeding or loss of fluid to suggest this.  Additionally she is past 12 weeks which also decreases her risk.  I do suspect that she may have a vaginitis, UTI, STD.  Possible component of round ligament/pelvic muscular pain as well.  Urinalysis, wet prep, GC/C swabs obtained.  Additionally will trial Flexeril for symptoms.  Advised heat/ice at home and minimizing lifting heavy weights.  Patient feels much improved with dose of Flexeril.  Discussed wet prep positive for BV and treatment with MetroGel .  UTI, yeast, trichomonas ruled out.  GC/C will take a couple days to come back and she was aware of this.  Strict return precautions given.  ASSESSMENT 1. Bacterial vaginosis   2. Abdominal cramping affecting pregnancy   3. Strain of lumbar region, initial encounter   4. [redacted] weeks gestation of pregnancy     PLAN Discharge home with strict return precautions. Allergies as of 02/27/2024       Reactions   Other Other (See Comments)   Seasonal - runny nose, itchy eyes        Medication List     TAKE these medications    aspirin  EC 81 MG tablet Take 2 tablets (162 mg total) by mouth daily. Swallow whole.   cyclobenzaprine 5 MG tablet Commonly known as: FLEXERIL Take 2 tablets (10  mg total) by mouth 3 (three) times daily as needed for muscle spasms.   metroNIDAZOLE  0.75 % vaginal gel Commonly known as: METROGEL  Place 1 Applicatorful vaginally at bedtime.   nitrofurantoin  (macrocrystal-monohydrate)  100 MG capsule Commonly known as: MACROBID  Take 1 capsule (100 mg total) by mouth 2 (two) times daily. X 7 days   ondansetron  4 MG disintegrating tablet Commonly known as: ZOFRAN -ODT Take 1 tablet (4 mg total) by mouth every 8 (eight) hours as needed for nausea or vomiting.   prenatal vitamin w/FE, FA 27-1 MG Tabs tablet Take 1 tablet by mouth daily at 12 noon.   valACYclovir  1000 MG tablet Commonly known as: Valtrex  Take 1 tablet (1,000 mg total) by mouth 2 (two) times daily.         Authur Leghorn, MD OB Fellow 02/27/2024  9:13 PM

## 2024-02-29 LAB — HORIZON CUSTOM: REPORT SUMMARY: NEGATIVE

## 2024-03-01 ENCOUNTER — Encounter: Payer: Self-pay | Admitting: Women's Health

## 2024-03-01 LAB — GC/CHLAMYDIA PROBE AMP (~~LOC~~) NOT AT ARMC
Chlamydia: NEGATIVE
Comment: NEGATIVE
Comment: NORMAL
Neisseria Gonorrhea: NEGATIVE

## 2024-03-08 ENCOUNTER — Ambulatory Visit: Admitting: Women's Health

## 2024-03-08 ENCOUNTER — Encounter: Payer: Self-pay | Admitting: Women's Health

## 2024-03-08 VITALS — BP 113/79 | HR 69 | Wt 110.0 lb

## 2024-03-08 DIAGNOSIS — Z1159 Encounter for screening for other viral diseases: Secondary | ICD-10-CM | POA: Diagnosis not present

## 2024-03-08 DIAGNOSIS — Z3A16 16 weeks gestation of pregnancy: Secondary | ICD-10-CM | POA: Diagnosis not present

## 2024-03-08 DIAGNOSIS — Z1379 Encounter for other screening for genetic and chromosomal anomalies: Secondary | ICD-10-CM | POA: Diagnosis not present

## 2024-03-08 DIAGNOSIS — Z3481 Encounter for supervision of other normal pregnancy, first trimester: Secondary | ICD-10-CM

## 2024-03-08 DIAGNOSIS — Z363 Encounter for antenatal screening for malformations: Secondary | ICD-10-CM

## 2024-03-08 DIAGNOSIS — Z3482 Encounter for supervision of other normal pregnancy, second trimester: Secondary | ICD-10-CM

## 2024-03-08 NOTE — Patient Instructions (Signed)
Taylor Jefferson, thank you for choosing our office today! We appreciate the opportunity to meet your healthcare needs. You may receive a short survey by mail, e-mail, or through [employer]. If you are happy with your care we would appreciate if you could take just a few minutes to complete the survey questions. We read all of your comments and take your feedback very seriously. Thank you again for choosing our office.  Center for [employer] Team at [place] at [facility] ([place], [place] [address]) Entrance C, located off of [place] parking  Go to [email or web address] to register for FREE online childbirth classes  Call the office [phone]) or go to [facility] if: You begin to severe cramping Your water breaks.  Sometimes it is a big gush of fluid, sometimes it is just a trickle that keeps getting your panties wet or running down your legs You have vaginal bleeding.  It is normal to have a small amount of spotting if your cervix was checked.   [facility] Pediatricians/Family Doctors [facility]): [address] [place], [place] [facility]: [address] [place], [phone]                [facility]): [place], [phone] (call to ask if accepting patients) [facility] Department: [address], [place], [place] Pediatricians/Family Doctors [facility]): [place]. [place], [address], [place] [facility]: [address] [place], [place] [facility]: [place], [place] [facility]): [phone] [facility]: [address] [place]: [address] [place] [facility]: [phone], [phone]  Home Blood Pressure Monitoring for Patients   Your provider has recommended that you check your blood pressure (BP) at least once a week at home. If you do not have a blood pressure cuff at home, one will be provided for you. Contact your provider if you have not received your monitor within 1 week.   Helpful Tips for Accurate Home Blood Pressure Checks  Don't smoke, exercise, or drink caffeine 30 minutes before checking your BP Use the restroom before checking your BP (a full bladder can raise your pressure) Relax in a comfortable upright chair Feet on the ground Left arm resting comfortably on a flat surface at the level of your heart Legs uncrossed Back supported Sit quietly and don't talk Place the cuff on your bare arm Adjust snuggly, so that only two fingertips can fit between your skin and the top of the cuff Check 2 readings separated by at least one minute Keep a log of your BP readings For a visual, please reference this diagram: http://ccnc.care/bpdiagram  Provider Name: [facility]     Phone: [phone]  Zone 1: ALL CLEAR  Continue to monitor your symptoms:  BP reading is less than 140 (top number) or less than 90 (bottom number)  No right upper stomach pain No headaches or seeing spots No feeling nauseated or throwing up No swelling in face and hands  Zone 2: CAUTION Call your doctor's office for any of the following:  BP reading is greater than 140 (top number) or greater than  90 (bottom number)  Stomach pain under your ribs in the middle or right side Headaches or seeing spots Feeling nauseated or throwing up Swelling in face and hands  Zone 3: EMERGENCY  Seek immediate medical care if you have any of the following:  BP reading is greater than160 (top number) or greater than 110 (bottom number) Severe headaches not improving with Tylenol Serious difficulty catching your breath Any worsening symptoms from  Zone 2     Second Trimester of Pregnancy The second trimester is from week 14 through week 27 (months 4 through 6). The second trimester is often a time when you feel your best. Your body has adjusted to being pregnant, and you begin to feel better physically. Usually, morning sickness has lessened or quit completely, you may have more energy, and you may have an increase in appetite. The second trimester is also a time when the fetus is growing rapidly. At the end of the sixth month, the fetus is about 9 inches long and weighs about 1 pounds. You will likely begin to feel the baby move (quickening) between 16 and 20 weeks of pregnancy. Body changes during your second trimester Your body continues to go through many changes during your second trimester. The changes vary from woman to woman. Your weight will continue to increase. You will notice your lower abdomen bulging out. You may begin to get stretch marks on your hips, abdomen, and breasts. You may develop headaches that can be relieved by medicines. The medicines should be approved by your health care provider. You may urinate more often because the fetus is pressing on your bladder. You may develop or continue to have heartburn as a result of your pregnancy. You may develop constipation because certain hormones are causing the muscles that push waste through your intestines to slow down. You may develop hemorrhoids or swollen, bulging veins (varicose veins). You may have back pain. This is caused by: Weight gain. Pregnancy hormones that are relaxing the joints in your pelvis. A shift in weight and the muscles that support your balance. Your breasts will continue to grow and they will continue to become tender. Your gums may bleed and may be sensitive to brushing and flossing. Dark spots or blotches (chloasma, mask of pregnancy) may develop on your face. This will likely fade after the baby is born. A dark line from your belly button to  the pubic area (linea nigra) may appear. This will likely fade after the baby is born. You may have changes in your hair. These can include thickening of your hair, rapid growth, and changes in texture. Some women also have hair loss during or after pregnancy, or hair that feels dry or thin. Your hair will most likely return to normal after your baby is born.  What to expect at prenatal visits During a routine prenatal visit: You will be weighed to make sure you and the fetus are growing normally. Your blood pressure will be taken. Your abdomen will be measured to track your baby's growth. The fetal heartbeat will be listened to. Any test results from the previous visit will be discussed.  Your health care provider may ask you: How you are feeling. If you are feeling the baby move. If you have had any abnormal symptoms, such as leaking fluid, bleeding, severe headaches, or abdominal cramping. If you are using any tobacco products, including cigarettes, chewing tobacco, and electronic cigarettes. If you have any questions.  Other tests that may be performed during  your second trimester include: Blood tests that check for: Low iron levels (anemia). High blood sugar that affects pregnant women (gestational diabetes) between 41 and 28 weeks. Rh antibodies. This is to check for a protein on red blood cells (Rh factor). Urine tests to check for infections, diabetes, or protein in the urine. An ultrasound to confirm the proper growth and development of the baby. An amniocentesis to check for possible genetic problems. Fetal screens for spina bifida and Down syndrome. HIV (human immunodeficiency virus) testing. Routine prenatal testing includes screening for HIV, unless you choose not to have this test.  Follow these instructions at home: Medicines Follow your health care provider's instructions regarding medicine use. Specific medicines may be either safe or unsafe to take during  pregnancy. Take a prenatal vitamin that contains at least 600 micrograms (mcg) of folic acid. If you develop constipation, try taking a stool softener if your health care provider approves. Eating and drinking Eat a balanced diet that includes fresh fruits and vegetables, whole grains, good sources of protein such as meat, eggs, or tofu, and low-fat dairy. Your health care provider will help you determine the amount of weight gain that is right for you. Avoid raw meat and uncooked cheese. These carry germs that can cause birth defects in the baby. If you have low calcium intake from food, talk to your health care provider about whether you should take a daily calcium supplement. Limit foods that are high in fat and processed sugars, such as fried and sweet foods. To prevent constipation: Drink enough fluid to keep your urine clear or pale yellow. Eat foods that are high in fiber, such as fresh fruits and vegetables, whole grains, and beans. Activity Exercise only as directed by your health care provider. Most women can continue their usual exercise routine during pregnancy. Try to exercise for 30 minutes at least 5 days a week. Stop exercising if you experience uterine contractions. Avoid heavy lifting, wear low heel shoes, and practice good posture. A sexual relationship may be continued unless your health care provider directs you otherwise. Relieving pain and discomfort Wear a good support bra to prevent discomfort from breast tenderness. Take warm sitz baths to soothe any pain or discomfort caused by hemorrhoids. Use hemorrhoid cream if your health care provider approves. Rest with your legs elevated if you have leg cramps or low back pain. If you develop varicose veins, wear support hose. Elevate your feet for 15 minutes, 3-4 times a day. Limit salt in your diet. Prenatal Care Write down your questions. Take them to your prenatal visits. Keep all your prenatal visits as told by your health  care provider. This is important. Safety Wear your seat belt at all times when driving. Make a list of emergency phone numbers, including numbers for family, friends, the hospital, and police and fire departments. General instructions Ask your health care provider for a referral to a local prenatal education class. Begin classes no later than the beginning of month 6 of your pregnancy. Ask for help if you have counseling or nutritional needs during pregnancy. Your health care provider can offer advice or refer you to specialists for help with various needs. Do not use hot tubs, steam rooms, or saunas. Do not douche or use tampons or scented sanitary pads. Do not cross your legs for long periods of time. Avoid cat litter boxes and soil used by cats. These carry germs that can cause birth defects in the baby and possibly loss of the  fetus by miscarriage or stillbirth. Avoid all smoking, herbs, alcohol, and unprescribed drugs. Chemicals in these products can affect the formation and growth of the baby. Do not use any products that contain nicotine or tobacco, such as cigarettes and e-cigarettes. If you need help quitting, ask your health care provider. Visit your dentist if you have not gone yet during your pregnancy. Use a soft toothbrush to brush your teeth and be gentle when you floss. Contact a health care provider if: You have dizziness. You have mild pelvic cramps, pelvic pressure, or nagging pain in the abdominal area. You have persistent nausea, vomiting, or diarrhea. You have a bad smelling vaginal discharge. You have pain when you urinate. Get help right away if: You have a fever. You are leaking fluid from your vagina. You have spotting or bleeding from your vagina. You have severe abdominal cramping or pain. You have rapid weight gain or weight loss. You have shortness of breath with chest pain. You notice sudden or extreme swelling of your face, hands, ankles, feet, or legs. You  have not felt your baby move in over an hour. You have severe headaches that do not go away when you take medicine. You have vision changes. Summary The second trimester is from week 14 through week 27 (months 4 through 6). It is also a time when the fetus is growing rapidly. Your body goes through many changes during pregnancy. The changes vary from woman to woman. Avoid all smoking, herbs, alcohol, and unprescribed drugs. These chemicals affect the formation and growth your baby. Do not use any tobacco products, such as cigarettes, chewing tobacco, and e-cigarettes. If you need help quitting, ask your health care provider. Contact your health care provider if you have any questions. Keep all prenatal visits as told by your health care provider. This is important. This information is not intended to replace advice given to you by your health care provider. Make sure you discuss any questions you have with your health care provider. Document Released: 10/01/2001 Document Revised: 03/14/2016 Document Reviewed: 12/08/2012 Elsevier Interactive Patient Education  2017 [employer].

## 2024-03-08 NOTE — Progress Notes (Signed)
 Aaron Aas   LOW-RISK PREGNANCY VISIT Patient name: Taylor Jefferson MRN [id number]  Date of birth: 2000/11/20 Chief Complaint:   Routine Prenatal Visit (2ndIT, urine culture)  History of Present Illness:   Taylor Jefferson is a 23 y.o. G61P0010 female at [redacted]w[redacted]d with an Estimated Date of Delivery: 08/22/24 being seen today for ongoing management of a low-risk pregnancy.   Today she reports no complaints. Contractions: Not present.  .  Movement: Absent. denies leaking of fluid.     02/09/2024    9:06 AM 05/16/2021    4:53 AM 12/05/2020    1:19 PM 01/31/2020   11:34 AM  Depression screen PHQ 2/9  Decreased Interest 1 2  0  Down, Depressed, Hopeless 0 2  0  PHQ - 2 Score 1 4  0  Altered sleeping 0 1  3  Tired, decreased energy 1 2  2   Change in appetite 1 0  2  Feeling bad or failure about yourself  0 2  0  Trouble concentrating 0 1  0  Moving slowly or fidgety/restless 0 0  1  Suicidal thoughts 0 1    PHQ-9 Score 3 11  8   Difficult doing work/chores  Very difficult       Information is confidential and restricted. Go to Review Flowsheets to unlock data.        02/09/2024    9:06 AM  GAD 7 : Generalized Anxiety Score  Nervous, Anxious, on Edge 0  Control/stop worrying 0  Worry too much - different things 0  Trouble relaxing 0  Restless 0  Easily annoyed or irritable 0  Afraid - awful might happen 0  Total GAD 7 Score 0      Review of Systems:   Pertinent items are noted in HPI Denies abnormal vaginal discharge w/ itching/odor/irritation, headaches, visual changes, shortness of breath, chest pain, abdominal pain, severe nausea/vomiting, or problems with urination or bowel movements unless otherwise stated above. Pertinent History Reviewed:  Reviewed past medical,surgical, social, obstetrical and family history.  Reviewed problem list, medications and allergies. Physical Assessment:   Vitals:   03/08/24 0850  BP: 113/79  Pulse: 69  Weight: 110 lb (49.9 kg)  Body mass index is  20.12 kg/m.        Physical Examination:   General appearance: Well appearing, and in no distress  Mental status: Alert, oriented to person, place, and time  Skin: Warm & dry  Cardiovascular: Normal heart rate noted  Respiratory: Normal respiratory effort, no distress  Abdomen: Soft, gravid, nontender  Pelvic: Cervical exam deferred         Extremities: Edema: None  Fetal Status: Fetal Heart Rate (bpm): 142   Movement: Absent    Chaperone: N/A No results found for this or any previous visit (from the past 24 hours).  Assessment & Plan:  1) Low-risk pregnancy G2P0010 at [redacted]w[redacted]d with an Estimated Date of Delivery: 08/22/24     Meds: No orders of the defined types were placed in this encounter.  Labs/procedures today: 2nd IT and HepB (not resulted at NOB)  Plan:  Continue routine obstetrical care  Next visit: prefers will be in person for u/s    Reviewed: Preterm labor symptoms and general obstetric precautions including but not limited to vaginal bleeding, contractions, leaking of fluid and fetal movement were reviewed in detail with the patient.  All questions were answered. Does have home bp cuff. Office bp cuff given: not applicable. Check bp weekly, let us  know  if consistently >140 and/or >90.  Follow-up: Return for As scheduled.  Future Appointments  Date Time Provider Department Center  03/11/2024  3:30 PM Seldon Dago, [place] [facility] None  03/29/2024  9:15 AM [facility] 2 [facility] None  03/29/2024 10:10 AM Ferd Householder, CNM [facility]    Orders Placed This Encounter  Procedures   US  OB Comp + 14 Wk   INTEGRATED 2   Hepatitis B surface antigen   Ferd Householder CNM, [facility] 03/08/2024 9:44 AM

## 2024-03-09 LAB — HEPATITIS B SURFACE ANTIGEN: Hepatitis B Surface Ag: NEGATIVE

## 2024-03-10 ENCOUNTER — Ambulatory Visit: Payer: Self-pay | Admitting: Women's Health

## 2024-03-10 LAB — INTEGRATED 2
AFP MoM: 1.7
Alpha-Fetoprotein: 69.3 ng/mL
Crown Rump Length: 56.8 mm
DIA MoM: 1.94
DIA Value: 382.7 pg/mL
Estriol, Unconjugated: 0.73 ng/mL
Gest. Age on Collection Date: 12 wk
Gestational Age: 16 wk
Maternal Age at EDD: 21.9 a
Nuchal Translucency (NT): 1.1 mm
Nuchal Translucency MoM: 0.73
Number of Fetuses: 1
PAPP-A MoM: 0.31
PAPP-A Value: 300.9 ng/mL
Sonographer ID#: 309760
Test Results:: NEGATIVE
Weight: 111 [lb_av]
Weight: 111 [lb_av]
hCG MoM: 1.49
hCG Value: 65.3 [IU]/mL
uE3 MoM: 0.66

## 2024-03-11 ENCOUNTER — Ambulatory Visit (INDEPENDENT_AMBULATORY_CARE_PROVIDER_SITE_OTHER): Admitting: Licensed Clinical Social Worker

## 2024-03-11 DIAGNOSIS — F331 Major depressive disorder, recurrent, moderate: Secondary | ICD-10-CM | POA: Diagnosis not present

## 2024-03-15 ENCOUNTER — Encounter (HOSPITAL_COMMUNITY): Payer: Self-pay | Admitting: Licensed Clinical Social Worker

## 2024-03-15 NOTE — Progress Notes (Signed)
 Virtual Visit via Video Note  I connected with Taylor Jefferson on 03/15/24 at  3:30 PM EDT by a video enabled telemedicine application and verified that I am speaking with the correct person using two identifiers.  Location: Patient: home Provider: home office   I discussed the limitations of evaluation and management by telemedicine and the availability of in person appointments. The patient expressed understanding and agreed to proceed.   I discussed the assessment and treatment plan with the patient. The patient was provided an opportunity to ask questions and all were answered. The patient agreed with the plan and demonstrated an understanding of the instructions.   The patient was advised to call back or seek an in-person evaluation if the symptoms worsen or if the condition fails to improve as anticipated.  I provided 45 minutes of non-face-to-face time during this encounter.   Seldon Dago, LCSW   THERAPIST PROGRESS NOTE  Session Time: 3:30pm-4:15pm  Participation Level: Active  Behavioral Response: CasualAlertAnxious and Irritable  Type of Therapy: Individual Therapy  Treatment Goals addressed:  Goal: LTG: Reduce frequency, intensity, and duration of depression symptoms so that daily functioning is improved     Dates: Start:  10/21/23    Expected End:  04/19/24       Disciplines: Interdisciplinary, PROVIDER                    Goal: LTG: Increase coping skills to manage depression and improve ability to perform daily activities     Dates: Start:  10/21/23    Expected End:  04/19/24       Disciplines: Interdisciplinary, PROVIDER       ProgressTowards Goals: Initial  Interventions: CBT  Summary: Taylor Jefferson is a 23 y.o. female who presents with MDD, recurrent, moderate.   Suicidal/Homicidal: Nowithout intent/plan  Therapist Response: Raneshia engaged well in individual virtual session with clinician. Clinician utilized CBT to process triggers,  thoughts, feelings, and behaviors at home. Clinician explored interactions with mother and noted some challenges and insecurity about housing. Clinician provided case management support and identified different agencies that can be helpful for young, single mothers to be. Clinician explored options for the baby's father to be a support, at least financially. Kiyo shared that she wants to do this on her own, but does not know how she will do this. Clinician identified several agencies that can be helpful, including DSS, HUD, and [facility]. Clinician also processed feelings about being pregnant, excitement about being a mother, and worries about the future.   Plan: Return again in 2 weeks.  Diagnosis: MDD (major depressive disorder), recurrent episode, moderate (HCC)  Collaboration of Care: Patient refused AEB due to pregnancy, Jennfier is taking no psyc meds. She will restart closer to the birth in order to manage post-partum sxs.  Patient/Guardian was advised Release of Information must be obtained prior to any record release in order to collaborate their care with an outside provider. Patient/Guardian was advised if they have not already done so to contact the registration department to sign all necessary forms in order for us  to release information regarding their care.   Consent: Patient/Guardian gives verbal consent for treatment and assignment of benefits for services provided during this visit. Patient/Guardian expressed understanding and agreed to proceed.   Merleen Stare [place], LCSW 03/15/2024

## 2024-03-17 ENCOUNTER — Encounter (HOSPITAL_COMMUNITY): Payer: Self-pay | Admitting: Licensed Clinical Social Worker

## 2024-03-17 ENCOUNTER — Ambulatory Visit (HOSPITAL_COMMUNITY): Admitting: Licensed Clinical Social Worker

## 2024-03-17 DIAGNOSIS — F330 Major depressive disorder, recurrent, mild: Secondary | ICD-10-CM | POA: Diagnosis not present

## 2024-03-17 NOTE — Progress Notes (Signed)
 Virtual Visit via Video Note  I connected with CARLYNN LEDUC on 03/17/24 at  3:30 PM EDT by a video enabled telemedicine application and verified that I am speaking with the correct person using two identifiers.  Location: Patient: home Provider: home office   I discussed the limitations of evaluation and management by telemedicine and the availability of in person appointments. The patient expressed understanding and agreed to proceed.   I discussed the assessment and treatment plan with the patient. The patient was provided an opportunity to ask questions and all were answered. The patient agreed with the plan and demonstrated an understanding of the instructions.   The patient was advised to call back or seek an in-person evaluation if the symptoms worsen or if the condition fails to improve as anticipated.  I provided 55 minutes of non-face-to-face time during this encounter.   Seldon Dago, LCSW   THERAPIST PROGRESS NOTE  Session Time: 3:30pm-4:25pm  Participation Level: Active  Behavioral Response: CasualAlertIrritable  Type of Therapy: Individual Therapy  Treatment Goals addressed:      Goal: LTG: Reduce frequency, intensity, and duration of depression symptoms so that daily functioning is improved     Dates: Start:  10/21/23    Expected End:  04/19/24       Disciplines: Interdisciplinary, PROVIDER                    Goal: LTG: Increase coping skills to manage depression and improve ability to perform daily activities     Dates: Start:  10/21/23    Expected End:  04/19/24       Disciplines: Interdisciplinary, PROVIDER       ProgressTowards Goals: Progressing  Interventions: CBT  Summary: AMYRI FRENZ is a 23 y.o. female who presents with MDD, recurrent, mild.   Suicidal/Homicidal: Nowithout intent/plan  Therapist Response: Zuria engaged well in individual virtual session with clinician. Clinician utilized CBT to process triggers, thoughts,  feelings, and interactions. Clinician processed current stressor of getting along with mother. Clinician explored recent challenges with communication. Clinician identified the importance of being a good speaker and a good listener. Clinician reflected the challenges of being a parent with a child who does not listen and a child with a parent who does not listen. Clinician explored mother's experience being pregnant and parenting Fatime. Clinician also noted many similarities in circumstances, with some possible projection coming from mom. Clinician encouraged Naisha to set up a family session with mom to assist in communication and ensuring support from mom.   Plan: Return again in 2-3 weeks.  Diagnosis: MDD (major depressive disorder), recurrent episode, mild (HCC)  Collaboration of Care: Other encouraged Novia to invite mom for a family session  Patient/Guardian was advised Release of Information must be obtained prior to any record release in order to collaborate their care with an outside provider. Patient/Guardian was advised if they have not already done so to contact the registration department to sign all necessary forms in order for us  to release information regarding their care.   Consent: Patient/Guardian gives verbal consent for treatment and assignment of benefits for services provided during this visit. Patient/Guardian expressed understanding and agreed to proceed.   Merleen Stare [place], LCSW 03/17/2024

## 2024-03-23 ENCOUNTER — Encounter: Payer: Self-pay | Admitting: Women's Health

## 2024-03-29 ENCOUNTER — Encounter: Payer: Self-pay | Admitting: Advanced Practice Midwife

## 2024-03-29 ENCOUNTER — Ambulatory Visit: Admitting: Advanced Practice Midwife

## 2024-03-29 ENCOUNTER — Ambulatory Visit: Admitting: Radiology

## 2024-03-29 VITALS — BP 124/84 | HR 80 | Wt 116.0 lb

## 2024-03-29 DIAGNOSIS — Z363 Encounter for antenatal screening for malformations: Secondary | ICD-10-CM | POA: Diagnosis not present

## 2024-03-29 DIAGNOSIS — O43192 Other malformation of placenta, second trimester: Secondary | ICD-10-CM | POA: Diagnosis not present

## 2024-03-29 DIAGNOSIS — F419 Anxiety disorder, unspecified: Secondary | ICD-10-CM

## 2024-03-29 DIAGNOSIS — Z3482 Encounter for supervision of other normal pregnancy, second trimester: Secondary | ICD-10-CM | POA: Diagnosis not present

## 2024-03-29 DIAGNOSIS — O365920 Maternal care for other known or suspected poor fetal growth, second trimester, not applicable or unspecified: Secondary | ICD-10-CM | POA: Diagnosis not present

## 2024-03-29 DIAGNOSIS — Z3A19 19 weeks gestation of pregnancy: Secondary | ICD-10-CM

## 2024-03-29 DIAGNOSIS — O365990 Maternal care for other known or suspected poor fetal growth, unspecified trimester, not applicable or unspecified: Secondary | ICD-10-CM | POA: Insufficient documentation

## 2024-03-29 DIAGNOSIS — O99342 Other mental disorders complicating pregnancy, second trimester: Secondary | ICD-10-CM

## 2024-03-29 DIAGNOSIS — Z3481 Encounter for supervision of other normal pregnancy, first trimester: Secondary | ICD-10-CM

## 2024-03-29 MED ORDER — VALACYCLOVIR HCL 1 G PO TABS: 500.0000 mg | ORAL_TABLET | Freq: Two times a day (BID) | ORAL | 12 refills | Status: DC

## 2024-03-29 MED ORDER — TERCONAZOLE 0.4 % VA CREA
1.0000 | TOPICAL_CREAM | Freq: Every day | VAGINAL | 0 refills | Status: DC
Start: 2024-03-29 — End: 2024-06-07

## 2024-03-29 NOTE — Patient Instructions (Signed)
 Taylor Jefferson, I greatly value your feedback.  If you receive a survey following your visit with us  today, we appreciate you taking the time to fill it out.  Thanks, Monda Angry, CNM     [facility] HAS MOVED!!! It is now [facility] ([address] [place], [place] [address]) Entrance located off of [address] Free 24/7 valet parking   Go to [email or web address] to register for FREE online childbirth classes    Second Trimester of Pregnancy The second trimester is from week 14 through week 27 (months 4 through 6). The second trimester is often a time when you feel your best. Your body has adjusted to being pregnant, and you begin to feel better physically. Usually, morning sickness has lessened or quit completely, you may have more energy, and you may have an increase in appetite. The second trimester is also a time when the fetus is growing rapidly. At the end of the sixth month, the fetus is about 9 inches long and weighs about 1 pounds. You will likely begin to feel the baby move (quickening) between 16 and 20 weeks of pregnancy. Body changes during your second trimester Your body continues to go through many changes during your second trimester. The changes vary from woman to woman. Your weight will continue to increase. You will notice your lower abdomen bulging out. You may begin to get stretch marks on your hips, abdomen, and breasts. You may develop headaches that can be relieved by medicines. The medicines should be approved by your health care provider. You may urinate more often because the fetus is pressing on your bladder. You may develop or continue to have heartburn as a result of your pregnancy. You may develop constipation because certain hormones are causing the muscles that push waste through your intestines to slow down. You may develop hemorrhoids or swollen, bulging veins (varicose veins). You may have back pain. This is  caused by: Weight gain. Pregnancy hormones that are relaxing the joints in your pelvis. A shift in weight and the muscles that support your balance. Your breasts will continue to grow and they will continue to become tender. Your gums may bleed and may be sensitive to brushing and flossing. Dark spots or blotches (chloasma, mask of pregnancy) may develop on your face. This will likely fade after the baby is born. A dark line from your belly button to the pubic area (linea nigra) may appear. This will likely fade after the baby is born. You may have changes in your hair. These can include thickening of your hair, rapid growth, and changes in texture. Some women also have hair loss during or after pregnancy, or hair that feels dry or thin. Your hair will most likely return to normal after your baby is born.  What to expect at prenatal visits During a routine prenatal visit: You will be weighed to make sure you and the fetus are growing normally. Your blood pressure will be taken. Your abdomen will be measured to track your baby's growth. The fetal heartbeat will be listened to. Any test results from the previous visit will be discussed.  Your health care provider may ask you: How you are feeling. If you are feeling the baby move. If you have had any abnormal symptoms, such as leaking fluid, bleeding, severe headaches, or abdominal cramping. If you are using any tobacco products, including cigarettes, chewing tobacco, and electronic cigarettes. If you have any questions.  Other tests  that may be performed during your second trimester include: Blood tests that check for: Low iron levels (anemia). High blood sugar that affects pregnant women (gestational diabetes) between 22 and 28 weeks. Rh antibodies. This is to check for a protein on red blood cells (Rh factor). Urine tests to check for infections, diabetes, or protein in the urine. An ultrasound to confirm the proper growth and  development of the baby. An amniocentesis to check for possible genetic problems. Fetal screens for spina bifida and Down syndrome. HIV (human immunodeficiency virus) testing. Routine prenatal testing includes screening for HIV, unless you choose not to have this test.  Follow these instructions at home: Medicines Follow your health care provider's instructions regarding medicine use. Specific medicines may be either safe or unsafe to take during pregnancy. Take a prenatal vitamin that contains at least 600 micrograms (mcg) of folic acid. If you develop constipation, try taking a stool softener if your health care provider approves. Eating and drinking Eat a balanced diet that includes fresh fruits and vegetables, whole grains, good sources of protein such as meat, eggs, or tofu, and low-fat dairy. Your health care provider will help you determine the amount of weight gain that is right for you. Avoid raw meat and uncooked cheese. These carry germs that can cause birth defects in the baby. If you have low calcium intake from food, talk to your health care provider about whether you should take a daily calcium supplement. Limit foods that are high in fat and processed sugars, such as fried and sweet foods. To prevent constipation: Drink enough fluid to keep your urine clear or pale yellow. Eat foods that are high in fiber, such as fresh fruits and vegetables, whole grains, and beans. Activity Exercise only as directed by your health care provider. Most women can continue their usual exercise routine during pregnancy. Try to exercise for 30 minutes at least 5 days a week. Stop exercising if you experience uterine contractions. Avoid heavy lifting, wear low heel shoes, and practice good posture. A sexual relationship may be continued unless your health care provider directs you otherwise. Relieving pain and discomfort Wear a good support bra to prevent discomfort from breast tenderness. Take  warm sitz baths to soothe any pain or discomfort caused by hemorrhoids. Use hemorrhoid cream if your health care provider approves. Rest with your legs elevated if you have leg cramps or low back pain. If you develop varicose veins, wear support hose. Elevate your feet for 15 minutes, 3-4 times a day. Limit salt in your diet. Prenatal Care Write down your questions. Take them to your prenatal visits. Keep all your prenatal visits as told by your health care provider. This is important. Safety Wear your seat belt at all times when driving. Make a list of emergency phone numbers, including numbers for family, friends, the hospital, and police and fire departments. General instructions Ask your health care provider for a referral to a local prenatal education class. Begin classes no later than the beginning of month 6 of your pregnancy. Ask for help if you have counseling or nutritional needs during pregnancy. Your health care provider can offer advice or refer you to specialists for help with various needs. Do not use hot tubs, steam rooms, or saunas. Do not douche or use tampons or scented sanitary pads. Do not cross your legs for long periods of time. Avoid cat litter boxes and soil used by cats. These carry germs that can cause birth defects in the baby  and possibly loss of the fetus by miscarriage or stillbirth. Avoid all smoking, herbs, alcohol, and unprescribed drugs. Chemicals in these products can affect the formation and growth of the baby. Do not use any products that contain nicotine or tobacco, such as cigarettes and e-cigarettes. If you need help quitting, ask your health care provider. Visit your dentist if you have not gone yet during your pregnancy. Use a soft toothbrush to brush your teeth and be gentle when you floss. Contact a health care provider if: You have dizziness. You have mild pelvic cramps, pelvic pressure, or nagging pain in the abdominal area. You have persistent  nausea, vomiting, or diarrhea. You have a bad smelling vaginal discharge. You have pain when you urinate. Get help right away if: You have a fever. You are leaking fluid from your vagina. You have spotting or bleeding from your vagina. You have severe abdominal cramping or pain. You have rapid weight gain or weight loss. You have shortness of breath with chest pain. You notice sudden or extreme swelling of your face, hands, ankles, feet, or legs. You have not felt your baby move in over an hour. You have severe headaches that do not go away when you take medicine. You have vision changes. Summary The second trimester is from week 14 through week 27 (months 4 through 6). It is also a time when the fetus is growing rapidly. Your body goes through many changes during pregnancy. The changes vary from woman to woman. Avoid all smoking, herbs, alcohol, and unprescribed drugs. These chemicals affect the formation and growth your baby. Do not use any tobacco products, such as cigarettes, chewing tobacco, and e-cigarettes. If you need help quitting, ask your health care provider. Contact your health care provider if you have any questions. Keep all prenatal visits as told by your health care provider. This is important. This information is not intended to replace advice given to you by your health care provider. Make sure you discuss any questions you have with your health care provider.

## 2024-03-29 NOTE — Progress Notes (Signed)
 US : GA = 19+1 weeks Single active female fetus, cephalic, FHR = 134 bpm, posterior pl high with fundal marginal pl cord insertion, MVP = 3.7 cm, EFW 2.2%  AC 10.6%,  Anatomy screen complete, no apparent fetal abn seen, nl Rt ov, Left ov not seen, CL = 3.9 cm,  closed

## 2024-03-29 NOTE — Progress Notes (Signed)
 HIGH-RISK PREGNANCY VISIT Patient name: Taylor Jefferson MRN [id number]  Date of birth: Jun 19, 2001 Chief Complaint:   Routine Prenatal Visit (Anatomy scan/White vaginal discharge with itching/)  History of Present Illness:   Taylor Jefferson is a 23 y.o. G2P0010 female at [redacted]w[redacted]d with an Estimated Date of Delivery: 08/22/24 being seen today for ongoing management of a high-risk pregnancy complicated by fetal growth restriction 2.2% w/normal AC.  Aaron Aas    Today she reports vaginal itching. Contractions: Not present. Vag. Bleeding: None.  Movement: Present. denies leaking of fluid. 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  Worried about passing it to the baby. Taking valtrex  1gm daily.  No recent outbreaks.      02/09/2024    9:06 AM 05/16/2021    4:53 AM 12/05/2020    1:19 PM 01/31/2020   11:34 AM  Depression screen PHQ 2/9  Decreased Interest 1 2  0  Down, Depressed, Hopeless 0 2  0  PHQ - 2 Score 1 4  0  Altered sleeping 0 1  3  Tired, decreased energy 1 2  2   Change in appetite 1 0  2  Feeling bad or failure about yourself  0 2  0  Trouble concentrating 0 1  0  Moving slowly or fidgety/restless 0 0  1  Suicidal thoughts 0 1    PHQ-9 Score 3 11  8   Difficult doing work/chores  Very difficult       Information is confidential and restricted. Go to Review Flowsheets to unlock data.        02/09/2024    9:06 AM  GAD 7 : Generalized Anxiety Score  Nervous, Anxious, on Edge 0  Control/stop worrying 0  Worry too much - different things 0  Trouble relaxing 0  Restless 0  Easily annoyed or irritable 0  Afraid - awful might happen 0  Total GAD 7 Score 0     Review of Systems:   Pertinent items are noted in HPI Denies abnormal vaginal discharge w/ itching/odor/irritation, headaches, visual changes, shortness of breath, chest pain, abdominal pain, severe nausea/vomiting, or problems with urination or bowel movements unless otherwise stated above. Pertinent History  Reviewed:  Reviewed past medical,surgical, social, obstetrical and family history.  Reviewed problem list, medications and allergies. Physical Assessment:   Vitals:   03/29/24 1027  BP: 124/84  Pulse: 80  Weight: 52.6 kg  Body mass index is 21.22 kg/m.           Physical Examination:   General appearance: alert, well appearing, and in no distress  Mental status: alert, oriented to person, place, and time  Skin: warm & dry   Extremities: Edema: None    Cardiovascular: normal heart rate noted  Respiratory: normal respiratory effort, no distress  Abdomen: gravid, soft, non-tender  Pelvic: SSE:  obvious yeast infection. No lesions         Chaperone: Engineer, site    Fetal Status:     Movement: Present    Fetal Surveillance Testing today:  US : GA = 19+1 weeks Single active female fetus, cephalic, FHR = 134 bpm, posterior pl high with fundal marginal pl cord insertion, MVP = 3.7 cm, EFW 2.2%  AC 10.6%,  Anatomy screen complete, no apparent fetal abn seen, nl Rt ov, Left ov not seen, CL = 3.9 cm,  closed            No results found for this or any previous visit (  from the past 24 hours).  Assessment & Plan:  High-risk pregnancy: G2P0010 at [redacted]w[redacted]d with an Estimated Date of Delivery: 08/22/24   1. [redacted] weeks gestation of pregnancy (Primary)   2. Encounter for supervision of other normal pregnancy in first trimester   3. Anxiety disorder, unspecified type Sees a counselor  4. Marginal insertion of umbilical cord affecting management of mother in second trimester Per FGR guidelines  5. Fetal growth restriction antepartum Discussed w/Dr. Randolm Butte.  For now, EFW 24/28 weeks  6.  HSV:  change dosing to BID;  discussed that hx of HSV is not an indication for CS (active or recent outbreak notwithstanding).     Meds:  Meds ordered this encounter  Medications   valACYclovir  (VALTREX ) 1000 MG tablet    Sig: Take 0.5 tablets (500 mg total) by mouth 2 (two) times daily.     Dispense:  30 tablet    Refill:  12    Supervising Provider:   Evalyn Hillier H [[id number]]   terconazole (TERAZOL 7) 0.4 % vaginal cream    Sig: Place 1 applicator vaginally at bedtime.    Dispense:  45 g    Refill:  0    Supervising Provider:   Evalyn Hillier H [[id number]]    Orders: No orders of the defined types were placed in this encounter.    Labs/procedures today: spec exam   Reviewed: general obstetric precautions including but not limited to vaginal bleeding, contractions, leaking of fluid and fetal movement were reviewed in detail with the patient.  All questions were answered. Does have home bp cuff. Office bp cuff given: not applicable. Check bp weekly, let us  know if consistently >140 and/or >90.  Follow-up: Return for EFW US  in 4 weeks and 8 weeks (HROB appts too.   Future Appointments  Date Time Provider Department Center  04/06/2024 12:30 PM Seldon Dago, LCSW [facility] None  04/14/2024  1:30 PM Seldon Dago, LCSW [facility] None  04/20/2024  4:30 PM Seldon Dago, LCSW [facility] None  04/27/2024  3:00 PM [facility] US  [facility] None  04/27/2024  3:50 PM Ferd Householder, CNM [facility]  05/25/2024 10:00 AM [facility] US  [facility] None  05/25/2024 10:50 AM Ferd Householder, CNM [facility]    No orders of the defined types were placed in this encounter.  Majel Scott , DNP, CNM [facility] 03/29/2024 11:08 AM

## 2024-04-06 ENCOUNTER — Ambulatory Visit (HOSPITAL_COMMUNITY): Admitting: Licensed Clinical Social Worker

## 2024-04-06 DIAGNOSIS — F331 Major depressive disorder, recurrent, moderate: Secondary | ICD-10-CM

## 2024-04-07 ENCOUNTER — Ambulatory Visit (HOSPITAL_COMMUNITY): Admitting: Licensed Clinical Social Worker

## 2024-04-12 ENCOUNTER — Encounter (HOSPITAL_COMMUNITY): Payer: Self-pay | Admitting: Licensed Clinical Social Worker

## 2024-04-12 NOTE — Progress Notes (Signed)
   THERAPIST PROGRESS NOTE  Session Time: 12:30pm-1:25pm  Participation Level: Active  Behavioral Response: CasualAlertAnxious, Depressed, and Irritable  Type of Therapy: Individual Therapy  Treatment Goals addressed:  Goal: LTG: Reduce frequency, intensity, and duration of depression symptoms so that daily functioning is improved     Dates: Start:  10/21/23    Expected End:  04/19/24       Disciplines: Interdisciplinary, PROVIDER                    Goal: LTG: Increase coping skills to manage depression and improve ability to perform daily activities     Dates: Start:  10/21/23    Expected End:  04/19/24       Disciplines: Interdisciplinary, PROVIDER       ProgressTowards Goals: Progressing  Interventions: CBT  Summary: Taylor Jefferson is a 23 y.o. female who presents with MDD, recurrent, moderate.   Suicidal/Homicidal: Nowithout intent/plan  Therapist Response: Andjela engaged well in individual and person session with clinician.  Clinician utilized CBT to process thoughts feelings and interactions.  Clinician explored current relationship with mother, which has been up and down for the past several weeks.  Clinician discussed having a family session with mother in order to establish rules, expectations, and needs. Clinician explored Laniah's anxiety levels.  Clinician processed the fears associated with pregnancy, parenting, taking the right decisions for herself and for her baby.  Clinician explored more details about father and her plans related to him being a part of their baby's life.  Plan: Return again in 2 weeks.  Diagnosis: MDD (major depressive disorder), recurrent episode, moderate (HCC)  Collaboration of Care: Patient refused AEB none required  Patient/Guardian was advised Release of Information must be obtained prior to any record release in order to collaborate their care with an outside provider. Patient/Guardian was advised if they have not already done so  to contact the registration department to sign all necessary forms in order for us  to release information regarding their care.   Consent: Patient/Guardian gives verbal consent for treatment and assignment of benefits for services provided during this visit. Patient/Guardian expressed understanding and agreed to proceed.   [name] [place], LCSW 04/12/2024

## 2024-04-14 ENCOUNTER — Encounter: Payer: Self-pay | Admitting: Women's Health

## 2024-04-14 ENCOUNTER — Ambulatory Visit (INDEPENDENT_AMBULATORY_CARE_PROVIDER_SITE_OTHER): Admitting: Licensed Clinical Social Worker

## 2024-04-14 DIAGNOSIS — Z113 Encounter for screening for infections with a predominantly sexual mode of transmission: Secondary | ICD-10-CM

## 2024-04-14 DIAGNOSIS — F331 Major depressive disorder, recurrent, moderate: Secondary | ICD-10-CM

## 2024-04-16 LAB — RPR: RPR Ser Ql: NONREACTIVE

## 2024-04-18 ENCOUNTER — Encounter (HOSPITAL_COMMUNITY): Payer: Self-pay | Admitting: Licensed Clinical Social Worker

## 2024-04-18 NOTE — Progress Notes (Signed)
 Virtual Visit via Video Note  I connected with Taylor Jefferson on 04/14/24 at  1:30 PM EDT by a video enabled telemedicine application and verified that I am speaking with the correct person using two identifiers.  Location: Patient: home Provider: home office   I discussed the limitations of evaluation and management by telemedicine and the availability of in person appointments. The patient expressed understanding and agreed to proceed.   I discussed the assessment and treatment plan with the patient. The patient was provided an opportunity to ask questions and all were answered. The patient agreed with the plan and demonstrated an understanding of the instructions.   The patient was advised to call back or seek an in-person evaluation if the symptoms worsen or if the condition fails to improve as anticipated.  I provided 45 minutes of non-face-to-face time during this encounter.   Taylor JONELLE Rosser, LCSW   THERAPIST PROGRESS NOTE  Session Time: 1:30pm-2:15pm  Participation Level: Active  Behavioral Response: CasualAlertEuthymic  Type of Therapy: Individual Therapy  Treatment Goals addressed:  Goal: LTG: Reduce frequency, intensity, and duration of depression symptoms so that daily functioning is improved     Dates: Start:  10/21/23    Expected End:  04/19/24       Disciplines: Interdisciplinary, PROVIDER                    Goal: LTG: Increase coping skills to manage depression and improve ability to perform daily activities     Dates: Start:  10/21/23    Expected End:  04/19/24       Disciplines: Interdisciplinary, PROVIDER       ProgressTowards Goals: Progressing  Interventions: Motivational Interviewing  Summary: Taylor Jefferson is a 23 y.o. female who presents with MDD, recurrent, moderate.   Suicidal/Homicidal: Nowithout intent/plan  Therapist Response: Taylor Jefferson engaged well in individual virtual session with clinician. Clinician utilized MI OARS to reflect  and summarize thoughts, feelings, and interactions. Clinician explored mood and interactions with mother. Clinician reflected ease and reduced tension with mother since last session. Clinician discussed the importance of team work between mom and Taylor Jefferson. Clinician also identified the importance of removing certain words or thoughts from the relationship, such as getting kicked out or not speaking. Clinician identified the value of listening and supporting each other. Clinician also processed ways for Taylor Jefferson to listen to herself while she is speaking in order to adjust her tone, volume, and pitch.  Recent concern about baby's development. Taylor Jefferson is being watched more closely due to these concerns.   Plan: Return again in 2 weeks.  Diagnosis: MDD (major depressive disorder), recurrent episode, moderate (HCC)  Collaboration of Care: Patient refused AEB none required  Patient/Guardian was advised Release of Information must be obtained prior to any record release in order to collaborate their care with an outside provider. Patient/Guardian was advised if they have not already done so to contact the registration department to sign all necessary forms in order for us  to release information regarding their care.   Consent: Patient/Guardian gives verbal consent for treatment and assignment of benefits for services provided during this visit. Patient/Guardian expressed understanding and agreed to proceed.   Taylor JONELLE Sherman, LCSW 04/18/2024

## 2024-04-19 ENCOUNTER — Encounter: Payer: Self-pay | Admitting: Women's Health

## 2024-04-20 ENCOUNTER — Ambulatory Visit (INDEPENDENT_AMBULATORY_CARE_PROVIDER_SITE_OTHER): Admitting: Licensed Clinical Social Worker

## 2024-04-20 DIAGNOSIS — F331 Major depressive disorder, recurrent, moderate: Secondary | ICD-10-CM | POA: Diagnosis not present

## 2024-04-24 ENCOUNTER — Encounter (HOSPITAL_COMMUNITY): Payer: Self-pay | Admitting: Licensed Clinical Social Worker

## 2024-04-24 NOTE — Progress Notes (Signed)
   THERAPIST PROGRESS NOTE  Session Time: 4:30pm-5:30pm  Participation Level: Active  Behavioral Response: CasualAlertIrritable  Type of Therapy: Family Therapy  Treatment Goals addressed:      Goal: LTG: Reduce frequency, intensity, and duration of depression symptoms so that daily functioning is improved     Dates: Start:  10/21/23    Expected End:  04/19/24       Disciplines: Interdisciplinary, PROVIDER                    Goal: LTG: Increase coping skills to manage depression and improve ability to perform daily activities     Dates: Start:  10/21/23    Expected End:  04/19/24       Disciplines: Interdisciplinary, PROVIDER       ProgressTowards Goals: Progressing  Interventions: Family Systems  Summary: MATIA ZELADA is a 23 y.o. female who presents with MDD, moderate.   Suicidal/Homicidal: Nowithout intent/plan  Therapist Response: Dahiana and mother Lorenza engaged well in family session in office with clinician. Clinician utilized Family Systems therapy to process roles, communication, and teamwork between mom and Kimiah for preparation of the new baby's arrival. Clinician processed ways to communicate better and to share feelings with each other, without losing tempers. Clinician identified triggers for Kathrynne, particularly regarding a mother's role in defending her child. Clinician also discussed the importance of removing some threats of Olayinka getting kicked out of the home. Clinician explored the feelings of frustration that occur when someone comes home angry and the other is caught off guard.  Laurin and mother were open to changing their communication style and agreed to be more thoughtful before speaking to each other, noting volume, tone, and intent.   Plan: Return again in 4 weeks.  Diagnosis: MDD (major depressive disorder), recurrent episode, moderate (HCC)  Collaboration of Care: Patient refused AEB none required. Mother engaged well in family session  and will continue to participate in family sessions as requested by Jessabelle.  Patient/Guardian was advised Release of Information must be obtained prior to any record release in order to collaborate their care with an outside provider. Patient/Guardian was advised if they have not already done so to contact the registration department to sign all necessary forms in order for us  to release information regarding their care.   Consent: Patient/Guardian gives verbal consent for treatment and assignment of benefits for services provided during this visit. Patient/Guardian expressed understanding and agreed to proceed.   [name] [place], LCSW 04/24/2024

## 2024-04-26 ENCOUNTER — Other Ambulatory Visit: Payer: Self-pay | Admitting: Advanced Practice Midwife

## 2024-04-26 DIAGNOSIS — O365921 Maternal care for other known or suspected poor fetal growth, second trimester, fetus 1: Secondary | ICD-10-CM

## 2024-04-27 ENCOUNTER — Other Ambulatory Visit: Payer: Self-pay | Admitting: Advanced Practice Midwife

## 2024-04-27 ENCOUNTER — Ambulatory Visit: Admitting: Obstetrics & Gynecology

## 2024-04-27 ENCOUNTER — Other Ambulatory Visit

## 2024-04-27 VITALS — BP 126/79 | HR 69 | Wt 119.5 lb

## 2024-04-27 DIAGNOSIS — O0992 Supervision of high risk pregnancy, unspecified, second trimester: Secondary | ICD-10-CM

## 2024-04-27 DIAGNOSIS — O365921 Maternal care for other known or suspected poor fetal growth, second trimester, fetus 1: Secondary | ICD-10-CM

## 2024-04-27 DIAGNOSIS — O365990 Maternal care for other known or suspected poor fetal growth, unspecified trimester, not applicable or unspecified: Secondary | ICD-10-CM

## 2024-04-27 DIAGNOSIS — O365920 Maternal care for other known or suspected poor fetal growth, second trimester, not applicable or unspecified: Secondary | ICD-10-CM | POA: Diagnosis not present

## 2024-04-27 DIAGNOSIS — Z3A25 25 weeks gestation of pregnancy: Secondary | ICD-10-CM

## 2024-04-27 DIAGNOSIS — Z3A23 23 weeks gestation of pregnancy: Secondary | ICD-10-CM | POA: Diagnosis not present

## 2024-04-27 NOTE — Progress Notes (Signed)
 US  23+2 wks,breech,small fundal placenta with a fundal marginal cord insertion,FHR 164 bpm,cx 3.2 cm,SVP of fluid 5.2 cm,elevated UAD with absent EDF,RI 99%,EFW 372 g  .2%,AC .9%

## 2024-04-29 ENCOUNTER — Inpatient Hospital Stay (HOSPITAL_COMMUNITY)

## 2024-04-29 ENCOUNTER — Inpatient Hospital Stay (HOSPITAL_COMMUNITY)
Admission: AD | Admit: 2024-04-29 | Discharge: 2024-04-29 | Disposition: A | Discharge status: Home / Self Care | Attending: Obstetrics and Gynecology | Admitting: Obstetrics and Gynecology

## 2024-04-29 ENCOUNTER — Encounter (HOSPITAL_COMMUNITY): Payer: Self-pay | Admitting: Obstetrics and Gynecology

## 2024-04-29 DIAGNOSIS — O26892 Other specified pregnancy related conditions, second trimester: Secondary | ICD-10-CM | POA: Diagnosis not present

## 2024-04-29 DIAGNOSIS — O365920 Maternal care for other known or suspected poor fetal growth, second trimester, not applicable or unspecified: Secondary | ICD-10-CM | POA: Diagnosis not present

## 2024-04-29 DIAGNOSIS — O368120 Decreased fetal movements, second trimester, not applicable or unspecified: Secondary | ICD-10-CM

## 2024-04-29 DIAGNOSIS — Z3A23 23 weeks gestation of pregnancy: Secondary | ICD-10-CM

## 2024-04-29 DIAGNOSIS — R102 Pelvic and perineal pain: Secondary | ICD-10-CM

## 2024-04-29 LAB — URINALYSIS, ROUTINE W REFLEX MICROSCOPIC
Bacteria, UA: NONE SEEN
Bilirubin Urine: NEGATIVE
Glucose, UA: NEGATIVE mg/dL
Hgb urine dipstick: NEGATIVE
Ketones, ur: NEGATIVE mg/dL
Nitrite: NEGATIVE
Protein, ur: NEGATIVE mg/dL
Specific Gravity, Urine: 1.028 (ref 1.005–1.030)
pH: 5 (ref 5.0–8.0)

## 2024-04-29 NOTE — MAU Provider Note (Signed)
 Chief Complaint:  Decreased Fetal Movement and Abdominal Pain   None     HPI: Taylor Jefferson is a 23 y.o. G2P0010 at [redacted]w[redacted]d by LMP with severe IUGR < 1% who presents to maternity admissions reporting decreased fetal movement and abdominal cramping today.   She reports more fetal movement after MAU arrival, denies LOF, vaginal bleeding, vaginal itching/burning, urinary symptoms, h/a, dizziness, n/v, or fever/chills.     HPI  Past Medical History: Past Medical History:  Diagnosis Date   ADD (attention deficit disorder)    Allergy    Amenorrhea 11/02/2014   Anxiety    Asthma    Chlamydia 09/27/2019   tx 12/7, POC________   Contraceptive education 04/04/2014   Contraceptive management 04-18-14   Death of parent 08/10/2020   Depression    HA (headache)    History of chlamydia 10/25/2019   Irregular periods 03/17/2019   Patellar pain    Dr. 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   Precocious puberty 10/08/2011   Reflux    Reflux    Seasonal allergies    Suicidal thoughts 01/06/2018   Vaginal burning 11/02/2014   Vaginal discharge 11/02/2014   Vaginal ulceration 04/04/2014   Will culture for HSV GC/CHL and a wound culture was obtained    Past obstetric history: OB History  Gravida Para Term Preterm AB Living  2    1   SAB IAB Ectopic Multiple Live Births  1        # Outcome Date GA Lbr Len/2nd Weight Sex Type Anes PTL Lv  2 Current           1 SAB             Past Surgical History: Past Surgical History:  Procedure Laterality Date   APPENDECTOMY     XI ROBOTIC LAPAROSCOPIC ASSISTED APPENDECTOMY N/A 10/09/2023   Procedure: XI ROBOTIC LAPAROSCOPIC ASSISTED APPENDECTOMY;  Surgeon: Mavis Anes, MD;  Location: [facility];  Service: General;  Laterality: N/A;    Family History: Family History  Problem Relation Age of Onset   Depression Mother    HIV Mother    Other Mother        spinal stenosis   Glaucoma Mother    Migraines Mother    Heart  disease Father    Hypertension Maternal Grandmother    Arthritis Maternal Grandmother        rheumatoid   Other Maternal Grandmother        acid reflux   Migraines Maternal Grandmother    Depression Maternal Grandmother    Hyperlipidemia Maternal Grandfather    ADD / ADHD Sister    ADD / ADHD Brother     Social History: Social History   Tobacco Use   Smoking status: Former    Types: Cigarettes    Passive exposure: Current   Smokeless tobacco: Never  Vaping Use   Vaping status: Former   Substances: Nicotine  Substance Use Topics   Alcohol use: Yes    Comment: occassionally   Drug use: Not Currently    Types: Marijuana    Comment: daily    Allergies:  Allergies  Allergen Reactions   Other Other (See Comments)    Seasonal - runny nose, itchy eyes    Meds:  Medications Prior to Admission  Medication Sig Dispense Refill Last Dose/Taking   prenatal vitamin w/FE, FA (PRENATAL 1 + 1) 27-1 MG TABS tablet Take 1 tablet by mouth daily at 12  noon. 30 tablet 12 04/29/2024   valACYclovir  (VALTREX ) 1000 MG tablet Take 0.5 tablets (500 mg total) by mouth 2 (two) times daily. 30 tablet 12 04/29/2024   acetaminophen  (TYLENOL ) 500 MG tablet Take 500 mg by mouth every 6 (six) hours as needed for moderate pain (pain score 4-6).      albuterol  (VENTOLIN  HFA) 108 (90 Base) MCG/ACT inhaler Inhale 1 puff into the lungs every 4 (four) hours as needed.      aspirin  EC 81 MG tablet Take 2 tablets (162 mg total) by mouth daily. Swallow whole. (Patient not taking: Reported on 03/29/2024) 180 tablet 2    cyclobenzaprine  (FLEXERIL ) 5 MG tablet Take 2 tablets (10 mg total) by mouth 3 (three) times daily as needed for muscle spasms. (Patient not taking: Reported on 03/29/2024) 30 tablet 2    ondansetron  (ZOFRAN -ODT) 4 MG disintegrating tablet Take 1 tablet (4 mg total) by mouth every 8 (eight) hours as needed for nausea or vomiting. (Patient not taking: Reported on 03/29/2024) 20 tablet 3    terconazole   (TERAZOL 7 ) 0.4 % vaginal cream Place 1 applicator vaginally at bedtime. 45 g 0     ROS:  Review of Systems  Constitutional:  Negative for chills, fatigue and fever.  Eyes:  Negative for visual disturbance.  Respiratory:  Negative for shortness of breath.   Cardiovascular:  Negative for chest pain.  Gastrointestinal:  Positive for abdominal pain. Negative for nausea and vomiting.  Genitourinary:  Negative for difficulty urinating, dysuria, flank pain, pelvic pain, vaginal bleeding, vaginal discharge and vaginal pain.  Neurological:  Negative for dizziness and headaches.  Psychiatric/Behavioral: Negative.       I have reviewed patient's Past Medical Hx, Surgical Hx, Family Hx, Social Hx, medications and allergies.   Physical Exam  Patient Vitals for the past 24 hrs:  BP Temp Pulse Resp SpO2 Height Weight  04/29/24 2050 117/67 -- -- -- -- -- --  04/29/24 2049 -- 98.4 F (36.9 C) 87 17 100 % 5' 3 (1.6 m) 55.8 kg   Constitutional: Well-developed, well-nourished female in no acute distress.  Cardiovascular: normal rate Respiratory: normal effort GI: Abd soft, non-tender, gravid appropriate for gestational age.  MS: Extremities nontender, no edema, normal ROM Neurologic: Alert and oriented x 4.  GU: Neg CVAT.  PELVIC EXAM:Deferred     Unable to obtain NST.  RN attempted upon pt arrival, and RN attempted a second time with CNM in room with bedside US .  Fetal movement and FHR seen on bedside US  but FHR not picked up by external fetal monitor, presumably due to small size of fetus.   Labs: Results for orders placed or performed during the hospital encounter of 04/29/24 (from the past 24 hours)  Urinalysis, Routine w reflex microscopic -Urine, Clean Catch     Status: Abnormal   Collection Time: 04/29/24  9:00 PM  Result Value Ref Range   Color, Urine YELLOW YELLOW   APPearance CLOUDY (A) CLEAR   Specific Gravity, Urine 1.028 1.005 - 1.030   pH 5.0 5.0 - 8.0   Glucose, UA  NEGATIVE NEGATIVE mg/dL   Hgb urine dipstick NEGATIVE NEGATIVE   Bilirubin Urine NEGATIVE NEGATIVE   Ketones, ur NEGATIVE NEGATIVE mg/dL   Protein, ur NEGATIVE NEGATIVE mg/dL   Nitrite NEGATIVE NEGATIVE   Leukocytes,Ua SMALL (A) NEGATIVE   RBC / HPF 0-5 0 - 5 RBC/hpf   WBC, UA 6-10 0 - 5 WBC/hpf   Bacteria, UA NONE SEEN NONE SEEN  Squamous Epithelial / HPF 21-50 0 - 5 /HPF   Mucus PRESENT    B/Positive/-- (04/21 1117)  Imaging:  No results found.  MAU Course/MDM: Orders Placed This Encounter  Procedures   US  MFM OB LIMITED   US  MFM UA CORD DOPPLER   Urinalysis, Routine w reflex microscopic -Urine, Clean Catch   Discharge patient Discharge disposition: 01-Home or Self Care; Discharge patient date: 04/29/2024    No orders of the defined types were placed in this encounter.     Fetal movement improved while pt in MAU, abdominal pain decreased.  US  with similar findings to imaging on 04/27/14 of elevated dopplers but no absent or reverse flow.   Consult Dr Erik with presentation, exam findings and test results. D/C home with close outpatient follow up.  Return precautions reviewed.    Assessment: 1. Decreased fetal movements in second trimester, single or unspecified fetus   2. Poor fetal growth affecting management of mother in second trimester, single or unspecified fetus   3. [redacted] weeks gestation of pregnancy     Plan: Discharge home Labor precautions and fetal kick counts  Follow-up Information     [facility] Follow up.   Specialty: Obstetrics and Gynecology Why: As scheduled Contact information: [address] [place]  [address] [phone]        [facility] Follow up.   Specialty: Obstetrics and Gynecology Why: As needed for emergencies Contact information: [address] [place]  [phone]               Allergies as of  04/29/2024       Reactions   Other Other (See Comments)   Seasonal - runny nose, itchy eyes        Medication List     TAKE these medications    acetaminophen  500 MG tablet Commonly known as: TYLENOL  Take 500 mg by mouth every 6 (six) hours as needed for moderate pain (pain score 4-6).   albuterol  108 (90 Base) MCG/ACT inhaler Commonly known as: VENTOLIN  HFA Inhale 1 puff into the lungs every 4 (four) hours as needed.   aspirin  EC 81 MG tablet Take 2 tablets (162 mg total) by mouth daily. Swallow whole.   cyclobenzaprine  5 MG tablet Commonly known as: FLEXERIL  Take 2 tablets (10 mg total) by mouth 3 (three) times daily as needed for muscle spasms.   ondansetron  4 MG disintegrating tablet Commonly known as: ZOFRAN -ODT Take 1 tablet (4 mg total) by mouth every 8 (eight) hours as needed for nausea or vomiting.   prenatal vitamin w/FE, FA 27-1 MG Tabs tablet Take 1 tablet by mouth daily at 12 noon.   terconazole  0.4 % vaginal cream Commonly known as: TERAZOL 7  Place 1 applicator vaginally at bedtime.   valACYclovir  1000 MG tablet Commonly known as: Valtrex  Take 0.5 tablets (500 mg total) by mouth 2 (two) times daily.        Olam Boards Certified Nurse-Midwife 04/29/2024 11:42 PM

## 2024-04-29 NOTE — MAU Note (Signed)
 Taylor Jefferson is a 23 y.o. at [redacted]w[redacted]d here in MAU reporting decreased FM today. Only felt him move once this am. Was recently told baby has some growth restriction. Lower abd cramping for couple hours. Denies VB or LOF.   LMP: n/a Onset of complaint: today Pain score: 5 Vitals:   04/29/24 2049 04/29/24 2050  BP:  117/67  Pulse: 87   Resp: 17   Temp: 98.4 F (36.9 C)   SpO2: 100%      FHT: 145  Lab orders placed from triage: u/a

## 2024-04-29 NOTE — MAU Note (Signed)
 Pt reports feeling more FM. FHT obtained with doppler.

## 2024-05-03 ENCOUNTER — Other Ambulatory Visit: Payer: Self-pay | Admitting: Obstetrics & Gynecology

## 2024-05-03 DIAGNOSIS — O365921 Maternal care for other known or suspected poor fetal growth, second trimester, fetus 1: Secondary | ICD-10-CM

## 2024-05-04 ENCOUNTER — Ambulatory Visit (INDEPENDENT_AMBULATORY_CARE_PROVIDER_SITE_OTHER): Admitting: Obstetrics & Gynecology

## 2024-05-04 ENCOUNTER — Other Ambulatory Visit

## 2024-05-04 VITALS — BP 123/85 | HR 69 | Wt 122.0 lb

## 2024-05-04 DIAGNOSIS — O365921 Maternal care for other known or suspected poor fetal growth, second trimester, fetus 1: Secondary | ICD-10-CM

## 2024-05-04 DIAGNOSIS — O43192 Other malformation of placenta, second trimester: Secondary | ICD-10-CM

## 2024-05-04 DIAGNOSIS — O365990 Maternal care for other known or suspected poor fetal growth, unspecified trimester, not applicable or unspecified: Secondary | ICD-10-CM

## 2024-05-04 DIAGNOSIS — O0992 Supervision of high risk pregnancy, unspecified, second trimester: Secondary | ICD-10-CM | POA: Diagnosis not present

## 2024-05-04 DIAGNOSIS — O365920 Maternal care for other known or suspected poor fetal growth, second trimester, not applicable or unspecified: Secondary | ICD-10-CM

## 2024-05-04 DIAGNOSIS — Z3A24 24 weeks gestation of pregnancy: Secondary | ICD-10-CM

## 2024-05-04 NOTE — Progress Notes (Signed)
 US  24+2 wks,cephalic,posterior fundal placenta gr 0,marginal cord insertion,CX 2.8 cm,FHR 137 bpm,SVP of fluid 4 cm,elevated UAD with absent EDF,RI 99%

## 2024-05-04 NOTE — Progress Notes (Signed)
 HIGH-RISK PREGNANCY VISIT Patient name: Taylor Jefferson MRN [id number]  Date of birth: [birth year] Chief Complaint:   Routine Prenatal Visit  History of Present Illness:   Taylor Jefferson is a 23 y.o. G35P0010 female at [redacted]w[redacted]d with an Estimated Date of Delivery: 08/22/24 being seen today for ongoing management of a high-risk pregnancy complicated by     ICD-10-CM   1. Supervision of high risk pregnancy in second trimester  O09.92     2. Fetal growth restriction antepartum: 0.2% with AEDF(no reverse)  O36.5990     3. Poor fetal growth affecting management of mother in second trimester, single or unspecified fetus  O36.5920 RPR    Rubella screen    Toxoplasma gondii antibody, IgG    HSV 1 antibody, IgG    HSV 2 antibody, IgG    Cmv antibody, IgG (EIA)    4. Marginal insertion of umbilical cord affecting management of mother in second trimester  O43.192      .    Today she reports no complaints. Contractions: Not present. Vag. Bleeding: None.  Movement: Present. denies leaking of fluid.      02/09/2024    9:06 AM 05/16/2021    4:53 AM 12/05/2020    1:19 PM 01/31/2020   11:34 AM  Depression screen PHQ 2/9  Decreased Interest 1 2  0  Down, Depressed, Hopeless 0 2  0  PHQ - 2 Score 1 4  0  Altered sleeping 0 1  3  Tired, decreased energy 1 2  2   Change in appetite 1 0  2  Feeling bad or failure about yourself  0 2  0  Trouble concentrating 0 1  0  Moving slowly or fidgety/restless 0 0  1  Suicidal thoughts 0 1    PHQ-9 Score 3 11  8   Difficult doing work/chores  Very difficult       Information is confidential and restricted. Go to Review Flowsheets to unlock data.        02/09/2024    9:06 AM  GAD 7 : Generalized Anxiety Score  Nervous, Anxious, on Edge 0  Control/stop worrying 0  Worry too much - different things 0  Trouble relaxing 0  Restless 0  Easily annoyed or irritable 0  Afraid - awful might happen 0  Total GAD 7 Score 0     Review of Systems:    Pertinent items are noted in HPI Denies abnormal vaginal discharge w/ itching/odor/irritation, headaches, visual changes, shortness of breath, chest pain, abdominal pain, severe nausea/vomiting, or problems with urination or bowel movements unless otherwise stated above. Pertinent History Reviewed:  Reviewed past medical,surgical, social, obstetrical and family history.  Reviewed problem list, medications and allergies. Physical Assessment:   Vitals:   05/04/24 1159  BP: 123/85  Pulse: 69  Weight: 122 lb (55.3 kg)  Body mass index is 21.61 kg/m.           Physical Examination:   General appearance: alert, well appearing, and in no distress  Mental status: alert, oriented to person, place, and time  Skin: warm & dry   Extremities:      Cardiovascular: normal heart rate noted  Respiratory: normal respiratory effort, no distress  Abdomen: gravid, soft, non-tender  Pelvic: Cervical exam deferred         Fetal Status:     Movement: Present    Fetal Surveillance Testing today: AEDF no reverse, maybe a moment or two of forward flow in  diastole   Chaperone: N/A    No results found for this or any previous visit (from the past 24 hours).  Assessment & Plan:  High-risk pregnancy: G2P0010 at [redacted]w[redacted]d with an Estimated Date of Delivery: 08/22/24      ICD-10-CM   1. Supervision of high risk pregnancy in second trimester  O09.92     2. Fetal growth restriction antepartum: 0.2% with AEDF(no reverse)  O36.5990     3. Poor fetal growth affecting management of mother in second trimester, single or unspecified fetus  O36.5920 RPR    Rubella screen    Toxoplasma gondii antibody, IgG    HSV 1 antibody, IgG    HSV 2 antibody, IgG    Cmv antibody, IgG (EIA)    4. Marginal insertion of umbilical cord affecting management of mother in second trimester  O43.192          Meds: No orders of the defined types were placed in this encounter.   Orders:  Orders Placed This Encounter   Procedures   RPR   Rubella screen   Toxoplasma gondii antibody, IgG   HSV 1 antibody, IgG   HSV 2 antibody, IgG   Cmv antibody, IgG (EIA)     Labs/procedures today: U/S  Treatment Plan:  weekly UAD, BPP/UAD starting at 28 weeks    Follow-up: Return for keep scheduled.   Future Appointments  Date Time Provider Department Center  05/10/2024  3:45 PM [facility] - [facility]YN US  [facility] None  05/10/2024  4:10 PM Jayne Vonn DEL, MD [facility]TOBGYN  05/12/2024  3:30 PM Bret, [name], LCSW [facility] None  05/18/2024  3:00 PM [facility] - [facility]YN US  [facility] None  05/18/2024  3:50 PM Marilynn Nest, DO [facility] FTOBGYN  05/18/2024  4:30 PM Bret, [name], LCSW [facility] None  05/25/2024 10:00 AM [facility] - [facility]TO[facility]YN US  [facility] None  05/25/2024 10:50 AM Delores Nidia CROME, FNP [facility]  06/01/2024  4:30 PM Bret, Harlene R, LCSW [facility] None  06/02/2024  2:15 PM [facility] - [facility]YN US  [facility] None  06/02/2024  3:10 PM Kizzie Suzen SAUNDERS, CNM [facility]YN  06/08/2024  2:15 PM [facility] - [facility]YN US  [facility] None  06/08/2024  3:10 PM Jayne Vonn DEL, MD [facility]  06/15/2024  2:15 PM [facility] - [facility]TO[facility]YN US  [facility] None  06/15/2024  3:10 PM Jayne Vonn DEL, MD [facility]  06/15/2024  4:30 PM Bret, [name], LCSW [facility] None  06/22/2024  2:15 PM [facility] - FTO[facility]YN US  [facility] None  06/22/2024  3:10 PM Jayne Vonn DEL, MD [facility]  06/28/2024  3:00 PM [facility] - FTO[facility]YN US  [facility] None  06/28/2024  3:50 PM Marilynn Nest, DO [facility]TO[facility]YN  07/01/2024  3:30 PM [facility] NURSE [facility]  07/05/2024  3:00 PM [facility] - [facility] US  [facility] None  07/05/2024  3:50 PM Marilynn Nest, DO [facility]  07/08/2024  3:30 PM [facility] NURSE [facility]  07/12/2024  1:30 PM [facility] - [facility] US  [facility] None  07/12/2024  2:30 PM Jayne Vonn DEL, MD [facility]  07/15/2024  3:30 PM [facility] NURSE [facility]  07/22/2024  3:30 PM [facility] NURSE [facility]  07/26/2024  3:00 PM [facility] - [facility]TO[facility]YN US  [facility]  None  07/29/2024  3:30 PM [facility] NURSE [facility]  08/02/2024  3:00 PM [facility] - FTO[facility]YN US  [facility] None  08/05/2024  3:30 PM [facility] NURSE [facility]YN  08/09/2024  3:00 PM [facility] - FTO[facility]YN US  [facility] None  08/12/2024  3:30 PM [facility] NURSE [facility]  [facility]GYN  08/16/2024  3:00 PM [facility] - [facility]YN US  [facility] None  08/19/2024  3:30 PM [facility] NURSE [facility]YN    Orders Placed This Encounter  Procedures   RPR   Rubella screen   Toxoplasma gondii antibody, IgG   HSV 1 antibody, IgG   HSV 2 antibody, IgG   Cmv antibody, IgG (EIA)   Vonn VEAR Inch  Attending Physician for the Center for [facility] 05/04/2024 2:53 PM

## 2024-05-05 LAB — RPR: RPR Ser Ql: NONREACTIVE

## 2024-05-05 LAB — TOXOPLASMA GONDII ANTIBODY, IGG: Toxoplasma IgG Ratio: <3 [IU]/mL (ref 0.0–7.1)

## 2024-05-05 LAB — HSV 1 ANTIBODY, IGG: HSV 1 Glycoprotein G Ab, IgG: NONREACTIVE

## 2024-05-05 LAB — CMV ANTIBODY, IGG (EIA): CMV Ab - IgG: >10 U/mL — ABNORMAL HIGH (ref 0.00–0.59)

## 2024-05-05 LAB — RUBELLA SCREEN: Rubella Antibodies, IGG: 8.5 {index} (ref >0.99)

## 2024-05-07 ENCOUNTER — Other Ambulatory Visit: Payer: Self-pay | Admitting: Obstetrics & Gynecology

## 2024-05-07 DIAGNOSIS — O365990 Maternal care for other known or suspected poor fetal growth, unspecified trimester, not applicable or unspecified: Secondary | ICD-10-CM

## 2024-05-09 NOTE — Progress Notes (Signed)
 HIGH-RISK PREGNANCY VISIT Patient name: Taylor Jefferson MRN [id number]  Date of birth: 2001-09-29 Chief Complaint:   Routine Prenatal Visit  History of Present Illness:   DANGELA HOW is a 23 y.o. G12P0010 female at [redacted]w[redacted]d with an Estimated Date of Delivery: 08/22/24 being seen today for ongoing management of a high-risk pregnancy complicated by     ICD-10-CM   1. Supervision of high risk pregnancy in second trimester  O09.92     2. Fetal growth restriction severe with absent end diastolic flow  O36.5990      .    Today she reports no complaints. Contractions: Not present. Vag. Bleeding: None.  Movement: Present. denies leaking of fluid.      02/09/2024    9:06 AM 05/16/2021    4:53 AM 12/05/2020    1:19 PM 01/31/2020   11:34 AM  Depression screen PHQ 2/9  Decreased Interest 1 2  0  Down, Depressed, Hopeless 0 2  0  PHQ - 2 Score 1 4  0  Altered sleeping 0 1  3  Tired, decreased energy 1 2  2   Change in appetite 1 0  2  Feeling bad or failure about yourself  0 2  0  Trouble concentrating 0 1  0  Moving slowly or fidgety/restless 0 0  1  Suicidal thoughts 0 1    PHQ-9 Score 3 11  8   Difficult doing work/chores  Very difficult       Information is confidential and restricted. Go to Review Flowsheets to unlock data.        02/09/2024    9:06 AM  GAD 7 : Generalized Anxiety Score  Nervous, Anxious, on Edge 0  Control/stop worrying 0  Worry too much - different things 0  Trouble relaxing 0  Restless 0  Easily annoyed or irritable 0  Afraid - awful might happen 0  Total GAD 7 Score 0     Review of Systems:   Pertinent items are noted in HPI Denies abnormal vaginal discharge w/ itching/odor/irritation, headaches, visual changes, shortness of breath, chest pain, abdominal pain, severe nausea/vomiting, or problems with urination or bowel movements unless otherwise stated above. Pertinent History Reviewed:  Reviewed past medical,surgical, social, obstetrical and  family history.  Reviewed problem list, medications and allergies. Physical Assessment:   Vitals:   04/27/24 1618  BP: 126/79  Pulse: 69  Weight: 119 lb 8 oz (54.2 kg)  Body mass index is 21.86 kg/m.           Physical Examination:   General appearance: alert, well appearing, and in no distress  Mental status: alert, oriented to person, place, and time  Skin: warm & dry   Extremities:      Cardiovascular: normal heart rate noted  Respiratory: normal respiratory effort, no distress  Abdomen: gravid, soft, non-tender  Pelvic: Cervical exam deferred         Fetal Status:     Movement: Present    Fetal Surveillance Testing today: US : EFW <1% with IAEDF   Chaperone: N/A    No results found for this or any previous visit (from the past 24 hours).  Assessment & Plan:  High-risk pregnancy: G2P0010 at [redacted]w[redacted]d with an Estimated Date of Delivery: 08/22/24      ICD-10-CM   1. Supervision of high risk pregnancy in second trimester  O09.92     2. Fetal growth restriction severe with absent end diastolic flow  O36.5990  Meds: No orders of the defined types were placed in this encounter.   Orders: No orders of the defined types were placed in this encounter.    Labs/procedures today: U/S  Treatment Plan:  US  weekly with UAD    Follow-up: Return for weekly Dopplers, see Dr Jayne after sonogram next week.   Future Appointments  Date Time Provider Department Center  05/10/2024  3:45 PM [facility] - [facility]YN US  [facility] None  05/10/2024  4:10 PM Jayne Vonn DEL, MD [facility] FTOBGYN  05/12/2024  3:30 PM Bret, [name], LCSW [facility] None  05/18/2024  3:00 PM [facility] - [facility] US  [facility] None  05/18/2024  3:50 PM Marilynn Nest, DO [facility]YN  05/18/2024  4:30 PM Bret, [name], LCSW [facility] None  05/25/2024 10:00 AM [facility] - [facility] US  [facility] None  05/25/2024 10:50 AM Delores Nidia CROME, FNP [facility]  06/01/2024  4:30 PM Bret, Harlene R, LCSW [facility] None   06/02/2024  2:15 PM [facility] - [facility]YN US  [facility] None  06/02/2024  3:10 PM Kizzie Suzen SAUNDERS, CNM [facility]  06/08/2024  2:15 PM [facility] - [facility] US  [facility] None  06/08/2024  3:10 PM Marilynn Nest, DO [facility]  06/15/2024  2:15 PM [facility] - [facility] US  [facility] None  06/15/2024  3:10 PM Jayne Vonn DEL, MD [facility]  06/15/2024  4:30 PM Bret, Harlene R, LCSW [facility] None  06/22/2024  2:15 PM [facility] - [facility] US  [facility] None  06/22/2024  3:10 PM Jayne Vonn DEL, MD [facility]  06/28/2024  3:00 PM [facility] - [facility] US  [facility] None  06/28/2024  3:50 PM Marilynn Nest, DO [facility]  07/01/2024  3:30 PM [facility] NURSE [facility]  07/05/2024  3:00 PM [facility] - [facility]YN US  [facility] None  07/05/2024  3:50 PM Marilynn Nest, DO [facility]  07/08/2024  3:30 PM [facility] NURSE [facility]  07/12/2024  1:30 PM [facility] - [facility] US  [facility] None  07/12/2024  2:30 PM Jayne Vonn DEL, MD [facility]  07/15/2024  3:30 PM [facility] NURSE [facility]  07/22/2024  3:30 PM [facility] NURSE [facility]  07/26/2024  3:00 PM [facility] - F[facility]YN US  [facility] None  07/29/2024  3:30 PM [facility] NURSE [facility]  08/02/2024  3:00 PM [facility] - [facility] US  [facility] None  08/05/2024  3:30 PM [facility] NURSE [facility]  08/09/2024  3:00 PM [facility] - [facility] US  [facility] None  08/12/2024  3:30 PM [facility] NURSE [facility]  08/16/2024  3:00 PM [facility] - [facility] US  [facility] None  08/19/2024  3:30 PM [facility] NURSE [facility]    No orders of the defined types were placed in this encounter.  Vonn DEL Jayne  Attending Physician for the Center for [facility] 05/09/2024 4:58 PM

## 2024-05-10 ENCOUNTER — Encounter: Payer: Self-pay | Admitting: Obstetrics & Gynecology

## 2024-05-10 ENCOUNTER — Ambulatory Visit (INDEPENDENT_AMBULATORY_CARE_PROVIDER_SITE_OTHER): Admitting: Obstetrics & Gynecology

## 2024-05-10 ENCOUNTER — Ambulatory Visit

## 2024-05-10 VITALS — BP 124/78 | HR 65 | Wt 122.0 lb

## 2024-05-10 DIAGNOSIS — O43192 Other malformation of placenta, second trimester: Secondary | ICD-10-CM

## 2024-05-10 DIAGNOSIS — O365920 Maternal care for other known or suspected poor fetal growth, second trimester, not applicable or unspecified: Secondary | ICD-10-CM

## 2024-05-10 DIAGNOSIS — B258 Other cytomegaloviral diseases: Secondary | ICD-10-CM | POA: Diagnosis not present

## 2024-05-10 DIAGNOSIS — Q248 Other specified congenital malformations of heart: Secondary | ICD-10-CM

## 2024-05-10 DIAGNOSIS — Z3A25 25 weeks gestation of pregnancy: Secondary | ICD-10-CM

## 2024-05-10 DIAGNOSIS — O0992 Supervision of high risk pregnancy, unspecified, second trimester: Secondary | ICD-10-CM

## 2024-05-10 DIAGNOSIS — O365990 Maternal care for other known or suspected poor fetal growth, unspecified trimester, not applicable or unspecified: Secondary | ICD-10-CM

## 2024-05-10 DIAGNOSIS — Z3A27 27 weeks gestation of pregnancy: Secondary | ICD-10-CM

## 2024-05-10 NOTE — Progress Notes (Signed)
 US  25+1 wks,breech,small fundal placenta gr 1 with fundal marginal cord insertion,CX 3.2 cm,absent EDF no reverse flow,FHR 126 bpm,? deviated heart axis to the left,SVP of fluid 3.3 cm

## 2024-05-12 ENCOUNTER — Ambulatory Visit (INDEPENDENT_AMBULATORY_CARE_PROVIDER_SITE_OTHER): Admitting: Licensed Clinical Social Worker

## 2024-05-12 DIAGNOSIS — F330 Major depressive disorder, recurrent, mild: Secondary | ICD-10-CM | POA: Diagnosis not present

## 2024-05-17 ENCOUNTER — Telehealth: Payer: Self-pay | Admitting: *Deleted

## 2024-05-17 ENCOUNTER — Other Ambulatory Visit: Payer: Self-pay | Admitting: Obstetrics & Gynecology

## 2024-05-17 DIAGNOSIS — O365990 Maternal care for other known or suspected poor fetal growth, unspecified trimester, not applicable or unspecified: Secondary | ICD-10-CM

## 2024-05-17 NOTE — Telephone Encounter (Signed)
 Sari from [facility] called. Pt's contact was + for RPR. Pt was negative. Pt got 2.4 million units of Bicillin today. Recommend retest at 3, 6 and 9 months.

## 2024-05-18 ENCOUNTER — Ambulatory Visit (HOSPITAL_COMMUNITY): Admitting: Licensed Clinical Social Worker

## 2024-05-18 ENCOUNTER — Other Ambulatory Visit

## 2024-05-18 ENCOUNTER — Ambulatory Visit: Admitting: Obstetrics & Gynecology

## 2024-05-18 VITALS — BP 120/79 | HR 80 | Wt 123.0 lb

## 2024-05-18 DIAGNOSIS — O0992 Supervision of high risk pregnancy, unspecified, second trimester: Secondary | ICD-10-CM

## 2024-05-18 DIAGNOSIS — Z3A26 26 weeks gestation of pregnancy: Secondary | ICD-10-CM

## 2024-05-18 DIAGNOSIS — O365920 Maternal care for other known or suspected poor fetal growth, second trimester, not applicable or unspecified: Secondary | ICD-10-CM | POA: Diagnosis not present

## 2024-05-18 DIAGNOSIS — B258 Other cytomegaloviral diseases: Secondary | ICD-10-CM

## 2024-05-18 DIAGNOSIS — O43192 Other malformation of placenta, second trimester: Secondary | ICD-10-CM

## 2024-05-18 DIAGNOSIS — O365990 Maternal care for other known or suspected poor fetal growth, unspecified trimester, not applicable or unspecified: Secondary | ICD-10-CM

## 2024-05-18 DIAGNOSIS — Q248 Other specified congenital malformations of heart: Secondary | ICD-10-CM

## 2024-05-18 NOTE — Progress Notes (Signed)
 US  26+2 wks,breech,small posterior fundal placenta gr 0,marginal cord insertion,FHR 139 bpm,axis of heart deviated to the left,out flow tracks appear parallel,absent EDF,RI .96,.94,.88,AFI 11 cm,EFW 442 g,discussed results with Dr.Eure

## 2024-05-18 NOTE — Progress Notes (Signed)
 HIGH-RISK PREGNANCY VISIT Patient name: Taylor Jefferson MRN [id number]  Date of birth: [birth year] Chief Complaint:   Routine Prenatal Visit  History of Present Illness:   Taylor Jefferson is a 23 y.o. G57P0010 female at [redacted]w[redacted]d with an Estimated Date of Delivery: 08/22/24 being seen today for ongoing management of a high-risk pregnancy complicated by     ICD-10-CM   1. Supervision of high risk pregnancy in second trimester  O09.92     2. Fetal growth restriction antepartum: 0.2% with AEDF(no reverse)  O36.5990     3. CMV: elevated IgG and IgM which is concerning for recent CMV infection: although IgM for CMV may remain elevated for months to years  B25.8     4. Long axis of fetal heart deviated to left: Fetal Echo is ordered  Q24.8     5. Marginal insertion of umbilical cord affecting management of mother in second trimester  O43.192      .    Today she reports no complaints. Contractions: Not present. Vag. Bleeding: None.  Movement: Present. denies leaking of fluid.      02/09/2024    9:06 AM 05/16/2021    4:53 AM 12/05/2020    1:19 PM 01/31/2020   11:34 AM  Depression screen PHQ 2/9  Decreased Interest 1 2  0  Down, Depressed, Hopeless 0 2  0  PHQ - 2 Score 1 4  0  Altered sleeping 0 1  3  Tired, decreased energy 1 2  2   Change in appetite 1 0  2  Feeling bad or failure about yourself  0 2  0  Trouble concentrating 0 1  0  Moving slowly or fidgety/restless 0 0  1  Suicidal thoughts 0 1    PHQ-9 Score 3 11  8   Difficult doing work/chores  Very difficult       Information is confidential and restricted. Go to Review Flowsheets to unlock data.        02/09/2024    9:06 AM  GAD 7 : Generalized Anxiety Score  Nervous, Anxious, on Edge 0  Control/stop worrying 0  Worry too much - different things 0  Trouble relaxing 0  Restless 0  Easily annoyed or irritable 0  Afraid - awful might happen 0  Total GAD 7 Score 0     Review of Systems:   Pertinent items are noted  in HPI Denies abnormal vaginal discharge w/ itching/odor/irritation, headaches, visual changes, shortness of breath, chest pain, abdominal pain, severe nausea/vomiting, or problems with urination or bowel movements unless otherwise stated above. Pertinent History Reviewed:  Reviewed past medical,surgical, social, obstetrical and family history.  Reviewed problem list, medications and allergies. Physical Assessment:   Vitals:   05/18/24 1630  BP: 120/79  Pulse: 80  Weight: 123 lb (55.8 kg)  Body mass index is 21.79 kg/m.           Physical Examination:   General appearance: alert, well appearing, and in no distress  Mental status: alert, oriented to person, place, and time  Skin: warm & dry   Extremities:      Cardiovascular: normal heart rate noted  Respiratory: normal respiratory effort, no distress  Abdomen: gravid, soft, non-tender  Pelvic: Cervical exam deferred         Fetal Status:     Movement: Present    Fetal Surveillance Testing today: UAD absent EDF  no reverse, long axis with left deviation   Chaperone: N/A  No results found for this or any previous visit (from the past 24 hours).  Assessment & Plan:  High-risk pregnancy: G2P0010 at [redacted]w[redacted]d with an Estimated Date of Delivery: 08/22/24      ICD-10-CM   1. Supervision of high risk pregnancy in second trimester  O09.92     2. Fetal growth restriction antepartum: 0.2% with AEDF(no reverse)  O36.5990     3. CMV: elevated IgG and IgM which is concerning for recent CMV infection: although IgM for CMV may remain elevated for months to years  B25.8     4. Long axis of fetal heart deviated to left: Fetal Echo is ordered  Q24.8     5. Marginal insertion of umbilical cord affecting management of mother in second trimester  O43.192          Meds: No orders of the defined types were placed in this encounter.   Orders: No orders of the defined types were placed in this encounter.    Labs/procedures today:  U/S  Treatment Plan:  fetal ECHO is scheduled, will alert [facility] after results     Follow-up: Return for keep scheduled.   Future Appointments  Date Time Provider Department Center  05/25/2024 10:00 AM [facility] - FTOBGYN US  [facility] None  05/25/2024 10:50 AM Delores Nidia CROME, FNP [facility] FTOBGYN  06/01/2024  4:30 PM Bret, Harlene R, LCSW [facility] None  06/02/2024  2:15 PM [facility] - FTOBGYN US  [facility] None  06/02/2024  3:10 PM Kizzie Suzen SAUNDERS, CNM [facility] FTOBGYN  06/08/2024  2:15 PM [facility] - FTOBGYN US  [facility] None  06/08/2024  3:10 PM Marilynn Nest, DO [facility] FTOBGYN  06/15/2024  2:15 PM [facility] - FTOBGYN US  [facility] None  06/15/2024  3:10 PM Jayne Vonn DEL, MD [facility] FTOBGYN  06/15/2024  4:30 PM Bret, Harlene R, LCSW [facility] None  06/22/2024  2:15 PM [facility] - FTOBGYN US  [facility] None  06/22/2024  3:10 PM Jayne Vonn DEL, MD [facility]YN  06/28/2024  3:00 PM [facility] - FTOBGYN US  [facility] None  06/28/2024  3:50 PM Marilynn Nest, DO [facility] FTOBGYN  07/01/2024  3:30 PM [facility] NURSE [facility]  07/05/2024  3:00 PM [facility] - FTOBGYN US  [facility] None  07/05/2024  3:50 PM Marilynn Nest, DO [facility]  07/08/2024  3:30 PM [facility] NURSE [facility]  07/12/2024  1:30 PM [facility] - FTOBGYN US  [facility] None  07/12/2024  2:30 PM Jayne Vonn DEL, MD [facility]TO[facility]YN  07/15/2024  3:30 PM [facility] NURSE [facility]  07/19/2024  3:30 PM [facility] NURSE [facility]  07/19/2024  3:50 PM Kizzie Suzen SAUNDERS, CNM [facility]  07/22/2024  3:30 PM [facility] NURSE [facility]  07/26/2024  3:00 PM [facility] - FTOBGYN US  [facility] None  07/29/2024  3:30 PM [facility] NURSE [facility]YN  08/02/2024  3:00 PM [facility] - FTOBGYN US  [facility] None  08/05/2024  3:30 PM [facility] NURSE [facility]TOBGYN  08/09/2024  3:00 PM [facility] - FTOBGYN US  [facility] None  08/12/2024  3:30 PM [facility] NURSE [facility] FTOBGYN  08/16/2024  3:00 PM [facility] - FTOBGYN US  [facility] None  08/19/2024  3:30 PM [facility]FT[facility]GY[facility] NURSE [facility] FTOBGYN     No orders of the defined types were placed in this encounter.  Vonn DEL Jayne  Attending Physician for the Center for [facility] 05/25/2024 9:04 AM

## 2024-05-19 ENCOUNTER — Encounter (HOSPITAL_COMMUNITY): Payer: Self-pay | Admitting: Licensed Clinical Social Worker

## 2024-05-19 LAB — SPECIMEN STATUS REPORT

## 2024-05-19 NOTE — Progress Notes (Signed)
 Virtual Visit via Video Note  I connected with Taylor Jefferson on 05/12/24 at  3:30 PM EDT by a video enabled telemedicine application and verified that I am speaking with the correct person using two identifiers.  Location: Patient: home Provider: home office   I discussed the limitations of evaluation and management by telemedicine and the availability of in person appointments. The patient expressed understanding and agreed to proceed.   I discussed the assessment and treatment plan with the patient. The patient was provided an opportunity to ask questions and all were answered. The patient agreed with the plan and demonstrated an understanding of the instructions.   The patient was advised to call back or seek an in-person evaluation if the symptoms worsen or if the condition fails to improve as anticipated.  I provided 45 minutes of non-face-to-face time during this encounter.   Taylor JONELLE Rosser, LCSW   THERAPIST PROGRESS NOTE  Session Time: 3:30pm-4:15pm  Participation Level: Active  Behavioral Response: CasualAlertAnxious and Euthymic  Type of Therapy: Individual Therapy  Treatment Goals addressed:         Goal: LTG: Reduce frequency, intensity, and duration of depression symptoms so that daily functioning is improved     Dates: Start:  10/21/23    Expected End:  04/19/24       Disciplines: Interdisciplinary, PROVIDER                    Goal: LTG: Increase coping skills to manage depression and improve ability to perform daily activities     Dates: Start:  10/21/23    Expected End:  04/19/24       Disciplines: Interdisciplinary, PROVIDER       ProgressTowards Goals: Progressing  Interventions: Motivational Interviewing  Summary: Taylor Jefferson is a 23 y.o. female who presents with MDD, recurrent, mild.   Suicidal/Homicidal: Nowithout intent/plan  Therapist Response: Taylor Jefferson engaged well in individual virtual session with clinician. Clinician utilized MI  OARS to reflect and summarize thoughts, feelings, and interactions. Clinician processed concerns about baby and noted that he will most likely be delivered prematurely due to issues with growth. Clinician reflected worries and stress about the health of baby and noted that OBGYN is taking every precaution necessary to ensure that Taylor Jefferson and baby are safe. Clinician identified the importance of keeping herself in mind, as well as the baby, as the life of the mother is on seminal importance to the life of the baby. Clinician processed relationship with mother and updates since last family session. Taylor Jefferson shared improvement in relationship and communication.    Plan: Return again in 2 weeks.  Diagnosis: MDD (major depressive disorder), recurrent episode, mild (HCC)  Collaboration of Care: Patient refused AEB none required  Patient/Guardian was advised Release of Information must be obtained prior to any record release in order to collaborate their care with an outside provider. Patient/Guardian was advised if they have not already done so to contact the registration department to sign all necessary forms in order for us  to release information regarding their care.   Consent: Patient/Guardian gives verbal consent for treatment and assignment of benefits for services provided during this visit. Patient/Guardian expressed understanding and agreed to proceed.   Taylor JONELLE [place], LCSW 05/19/2024

## 2024-05-20 ENCOUNTER — Encounter: Payer: Self-pay | Admitting: Obstetrics & Gynecology

## 2024-05-24 ENCOUNTER — Other Ambulatory Visit: Payer: Self-pay | Admitting: Obstetrics & Gynecology

## 2024-05-24 DIAGNOSIS — O365990 Maternal care for other known or suspected poor fetal growth, unspecified trimester, not applicable or unspecified: Secondary | ICD-10-CM

## 2024-05-25 ENCOUNTER — Ambulatory Visit: Admitting: Obstetrics and Gynecology

## 2024-05-25 ENCOUNTER — Other Ambulatory Visit

## 2024-05-25 VITALS — BP 136/88 | HR 81 | Wt 126.0 lb

## 2024-05-25 DIAGNOSIS — Z3A27 27 weeks gestation of pregnancy: Secondary | ICD-10-CM

## 2024-05-25 DIAGNOSIS — O365920 Maternal care for other known or suspected poor fetal growth, second trimester, not applicable or unspecified: Secondary | ICD-10-CM | POA: Diagnosis not present

## 2024-05-25 DIAGNOSIS — O0992 Supervision of high risk pregnancy, unspecified, second trimester: Secondary | ICD-10-CM

## 2024-05-25 DIAGNOSIS — Q248 Other specified congenital malformations of heart: Secondary | ICD-10-CM | POA: Insufficient documentation

## 2024-05-25 DIAGNOSIS — B258 Other cytomegaloviral diseases: Secondary | ICD-10-CM | POA: Insufficient documentation

## 2024-05-25 DIAGNOSIS — Z131 Encounter for screening for diabetes mellitus: Secondary | ICD-10-CM

## 2024-05-25 DIAGNOSIS — O365990 Maternal care for other known or suspected poor fetal growth, unspecified trimester, not applicable or unspecified: Secondary | ICD-10-CM

## 2024-05-25 DIAGNOSIS — O43192 Other malformation of placenta, second trimester: Secondary | ICD-10-CM

## 2024-05-25 NOTE — Progress Notes (Signed)
 US  27+2 wks,breech,small posterior fundal placenta with marginal cord insertion GR 1,elevated UAD with EDF, RI .85,.84,.89,.86=99%,SVP of fluid 4 cm,cx 3 cm,FHR 146 bpm,heart axis deviated to the left

## 2024-05-25 NOTE — Progress Notes (Signed)
 HIGH-RISK PREGNANCY VISIT Patient name: Taylor Jefferson MRN [id number]  Date of birth: [birth year] Chief Complaint:   Routine Prenatal Visit  History of Present Illness:   Taylor Jefferson is a 23 y.o. G64P0010 female at [redacted]w[redacted]d with an Estimated Date of Delivery: 08/22/24 being seen today for ongoing management of a high-risk pregnancy complicated by     ICD-10-CM   1. Supervision of high risk pregnancy in second trimester  O09.92     2. Fetal growth restriction antepartum: 0.2% with AEDF(no reverse)  O36.5990     3. CMV: elevated IgG and IgM which is concerning for recent CMV infection: although IgM for CMV may remain elevated for months to years  B25.8    no intracranial evidence of CMV    4. Marginal insertion of umbilical cord affecting management of mother in second trimester  O43.192     5. Long axis of fetal heart deviated to left: Fetal Echo is ordered  Q24.8      .    Today she reports no complaints. Contractions: Not present. Vag. Bleeding: None.  Movement: Present. denies leaking of fluid.      02/09/2024    9:06 AM 05/16/2021    4:53 AM 12/05/2020    1:19 PM 01/31/2020   11:34 AM  Depression screen PHQ 2/9  Decreased Interest 1 2  0  Down, Depressed, Hopeless 0 2  0  PHQ - 2 Score 1 4  0  Altered sleeping 0 1  3  Tired, decreased energy 1 2  2   Change in appetite 1 0  2  Feeling bad or failure about yourself  0 2  0  Trouble concentrating 0 1  0  Moving slowly or fidgety/restless 0 0  1  Suicidal thoughts 0 1    PHQ-9 Score 3 11  8   Difficult doing work/chores  Very difficult       Information is confidential and restricted. Go to Review Flowsheets to unlock data.        02/09/2024    9:06 AM  GAD 7 : Generalized Anxiety Score  Nervous, Anxious, on Edge 0  Control/stop worrying 0  Worry too much - different things 0  Trouble relaxing 0  Restless 0  Easily annoyed or irritable 0  Afraid - awful might happen 0  Total GAD 7 Score 0     Review of  Systems:   Pertinent items are noted in HPI Denies abnormal vaginal discharge w/ itching/odor/irritation, headaches, visual changes, shortness of breath, chest pain, abdominal pain, severe nausea/vomiting, or problems with urination or bowel movements unless otherwise stated above. Pertinent History Reviewed:  Reviewed past medical,surgical, social, obstetrical and family history.  Reviewed problem list, medications and allergies. Physical Assessment:   Vitals:   05/10/24 1623  BP: 124/78  Pulse: 65  Weight: 122 lb (55.3 kg)  Body mass index is 21.61 kg/m.           Physical Examination:   General appearance: alert, well appearing, and in no distress  Mental status: alert, oriented to person, place, and time  Skin: warm & dry   Extremities: Edema: None    Cardiovascular: normal heart rate noted  Respiratory: normal respiratory effort, no distress  Abdomen: gravid, soft, non-tender  Pelvic: Cervical exam deferred         Fetal Status:     Movement: Present    Fetal Surveillance Testing today: UAD with absent EDF no reverse, long axis of heart  appears deviated to the left, 33 degrees or so   Chaperone:     No results found for this or any previous visit (from the past 24 hours).  Assessment & Plan:  High-risk pregnancy: G2P0010 at [redacted]w[redacted]d with an Estimated Date of Delivery: 08/22/24      ICD-10-CM   1. Supervision of high risk pregnancy in second trimester  O09.92     2. Fetal growth restriction antepartum: 0.2% with AEDF(no reverse)  O36.5990     3. CMV: elevated IgG and IgM which is concerning for recent CMV infection: although IgM for CMV may remain elevated for months to years  B25.8    no intracranial evidence of CMV    4. Marginal insertion of umbilical cord affecting management of mother in second trimester  O43.192     5. Long axis of fetal heart deviated to left: Fetal Echo is ordered  Q24.8          Meds: No orders of the defined types were placed in this  encounter.   Orders: No orders of the defined types were placed in this encounter.    Labs/procedures today: U/S  Treatment Plan:  weekly UAD, fetal ECHO  Depending on her ECHO results will add to [facility] list for multidisciplinary awareness  Follow-up: Return for keep scheduled.      Vonn VEAR Inch  Attending Physician for the Center for [facility]

## 2024-05-25 NOTE — Progress Notes (Signed)
 PRENATAL VISIT NOTE  Subjective:  Taylor Jefferson is a 23 y.o. G2P0010 at [redacted]w[redacted]d being seen today for ongoing prenatal care.  She is currently monitored for the following issues for this high-risk pregnancy and has Anxiety state; Migraine without aura and without status migrainosus, not intractable; MDD (major depressive disorder), recurrent severe, without psychosis (HCC); Chlamydia; Flat feet, bilateral; Mild persistent asthma without complication; Intentional overdose of drug in tablet form (HCC); Marijuana use; Recurrent major depressive disorder (HCC); Unspecified mood (affective) disorder (HCC); Anxiety disorder, unspecified; Herpes; History of chlamydia; Acute appendicitis with localized peritonitis, without perforation, abscess, or gangrene; Supervision of normal pregnancy; Marginal insertion of umbilical cord affecting management of mother in second trimester; Fetal growth restriction antepartum; Supervision of high risk pregnancy in second trimester; Long axis of fetal heart deviated to left: Fetal Echo is ordered; and CMV: elevated IgG and IgM which is concerning for recent CMV infection: although IgM for CMV may remain elevated for months to years on their problem list.  Patient reports no complaints.  Contractions: Irritability. Vag. Bleeding: None.  Movement: Present. Denies leaking of fluid.   The following portions of the patient's history were reviewed and updated as appropriate: allergies, current medications, past family history, past medical history, past social history, past surgical history and problem list.   Objective:    Vitals:   05/25/24 1010  BP: 136/88  Pulse: 81  Weight: 126 lb (57.2 kg)    Fetal Status:      Movement: Present    General: Alert, oriented and cooperative. Patient is in no acute distress.  Skin: Skin is warm and dry. No rash noted.   Cardiovascular: Normal heart rate noted  Respiratory: Normal respiratory effort, no problems with respiration  noted  Abdomen: Soft, gravid, appropriate for gestational age.  Pain/Pressure: Present     Pelvic: Cervical exam deferred        Extremities: Normal range of motion.     Mental Status: Normal mood and affect. Normal behavior. Normal judgment and thought content.   Assessment and Plan:  Pregnancy: G2P0010 at [redacted]w[redacted]d 1. Supervision of high risk pregnancy in second trimester (Primary)   2. Fetal growth restriction antepartum: elevated doppler 99% w/ EDF Eleavated UAD, plan biweekly with NST and bpp at 32 weeks. Weekly dopplers/bpp, next scheduled 8/13  Has not received call for fetal echo, referral placed, information previously faxed  3. Long axis of fetal heart deviated to left: Fetal Echo is ordered Awaiting call to schedule fetal echo  4. Marginal insertion of umbilical cord affecting management of mother in second trimester Continue growth u/s   5. [redacted] weeks gestation of pregnancy GTT and labs today  Planning [place] peds Planning on circ  6. CMV: elevated IgG and IgM which is concerning for recent CMV infection: although IgM for CMV may remain elevated for months to years   Preterm labor symptoms and general obstetric precautions including but not limited to vaginal bleeding, contractions, leaking of fluid and fetal movement were reviewed in detail with the patient. Please refer to After Visit Summary for other counseling recommendations.    Future Appointments  Date Time Provider Department Center  06/01/2024  4:30 PM Bret [name], LCSW [facility] None  06/02/2024  2:15 PM [facility] - FTOBGYN US  [facility] None  06/02/2024  3:10 PM Kizzie Suzen SAUNDERS, CNM [facility] FTOBGYN  06/08/2024  2:15 PM [facility] - FTOBGYN US  [facility] None  06/08/2024  3:10 PM Marilynn Nest, DO [facility] F[facility]BGYN  06/15/2024  2:15  PM [facility] - FTOBGYN US  [facility] None  06/15/2024  3:10 PM Jayne Vonn DEL, MD [facility] FTOBGYN  06/15/2024  4:30 PM Bret, Harlene R, LCSW [facility] None  06/22/2024  2:15 PM [facility] - FTOBGYN  US  [facility] None  06/22/2024  3:10 PM Jayne Vonn DEL, MD [facility] FTOBGYN  06/28/2024  3:00 PM [facility] - FTOBGYN US  [facility] None  06/28/2024  3:50 PM Ozan, Jennifer, DO [facility] FTOBGYN  07/01/2024  3:30 PM [facility] NURSE [facility]  07/05/2024  3:00 PM [facility] - F[facility]YN US  [facility] None  07/05/2024  3:50 PM Marilynn Nest, DO [facility]YN  07/08/2024  3:30 PM [facility] NURSE [facility]  07/12/2024  1:30 PM [facility] - F[facility]YN US  [facility] None  07/12/2024  2:30 PM Jayne Vonn DEL, MD [facility]  07/15/2024  3:30 PM [facility] NURSE [facility]  07/19/2024  3:30 PM [facility] NURSE [facility]  07/19/2024  3:50 PM Kizzie Suzen SAUNDERS, CNM [facility]  07/22/2024  3:30 PM [facility] NURSE [facility]  07/26/2024  3:00 PM [facility] - F[facility]YN US  [facility] None  07/29/2024  3:30 PM [facility] NURSE [facility]  08/02/2024  3:00 PM [facility] - F[facility]YN US  [facility] None  08/05/2024  3:30 PM [facility] NURSE [facility]  08/09/2024  3:00 PM [facility] - F[facility]YN US  [facility] None  08/12/2024  3:30 PM [facility] NURSE [facility]YN  08/16/2024  3:00 PM [facility] - F[facility]YN US  [facility] None  08/19/2024  3:30 PM [facility] NURSE [facility]    Nidia Daring, FNP

## 2024-05-26 ENCOUNTER — Encounter (HOSPITAL_COMMUNITY): Payer: Self-pay | Admitting: Obstetrics and Gynecology

## 2024-05-26 ENCOUNTER — Other Ambulatory Visit: Payer: Self-pay

## 2024-05-26 ENCOUNTER — Inpatient Hospital Stay (HOSPITAL_COMMUNITY)
Admission: AD | Admit: 2024-05-26 | Discharge: 2024-05-26 | Disposition: A | Discharge status: Home / Self Care | Attending: Obstetrics and Gynecology | Admitting: Obstetrics and Gynecology

## 2024-05-26 DIAGNOSIS — O10912 Unspecified pre-existing hypertension complicating pregnancy, second trimester: Secondary | ICD-10-CM | POA: Diagnosis not present

## 2024-05-26 DIAGNOSIS — O26899 Other specified pregnancy related conditions, unspecified trimester: Secondary | ICD-10-CM

## 2024-05-26 DIAGNOSIS — R109 Unspecified abdominal pain: Secondary | ICD-10-CM | POA: Diagnosis not present

## 2024-05-26 DIAGNOSIS — Z3A27 27 weeks gestation of pregnancy: Secondary | ICD-10-CM

## 2024-05-26 DIAGNOSIS — O26892 Other specified pregnancy related conditions, second trimester: Secondary | ICD-10-CM | POA: Diagnosis present

## 2024-05-26 DIAGNOSIS — O10919 Unspecified pre-existing hypertension complicating pregnancy, unspecified trimester: Secondary | ICD-10-CM

## 2024-05-26 LAB — CBC WITH DIFFERENTIAL/PLATELET
Abs Immature Granulocytes: 0.01 K/uL (ref 0.00–0.07)
Basophils Absolute: 0 K/uL (ref 0.0–0.1)
Basophils Relative: 0 %
Eosinophils Absolute: 0 K/uL (ref 0.0–0.5)
Eosinophils Relative: 1 %
HCT: 43.2 % (ref 36.0–46.0)
Hemoglobin: 14.6 g/dL (ref 12.0–15.0)
Immature Granulocytes: 0 %
Lymphocytes Relative: 25 %
Lymphs Abs: 1.6 K/uL (ref 0.7–4.0)
MCH: 30.5 pg (ref 26.0–34.0)
MCHC: 33.8 g/dL (ref 30.0–36.0)
MCV: 90.2 fL (ref 80.0–100.0)
Monocytes Absolute: 0.3 K/uL (ref 0.1–1.0)
Monocytes Relative: 5 %
Neutro Abs: 4.3 K/uL (ref 1.7–7.7)
Neutrophils Relative %: 69 %
Platelets: 182 K/uL (ref 150–400)
RBC: 4.79 MIL/uL (ref 3.87–5.11)
RDW: 12.2 % (ref 11.5–15.5)
WBC: 6.3 K/uL (ref 4.0–10.5)
nRBC: 0 % (ref 0.0–0.2)

## 2024-05-26 LAB — COMPREHENSIVE METABOLIC PANEL WITH GFR
ALT: 15 U/L (ref 0–44)
AST: 20 U/L (ref 15–41)
Albumin: 3.5 g/dL (ref 3.5–5.0)
Alkaline Phosphatase: 134 U/L — ABNORMAL HIGH (ref 38–126)
Anion gap: 10 (ref 5–15)
BUN: 6 mg/dL (ref 6–20)
CO2: 23 mmol/L (ref 22–32)
Calcium: 9.1 mg/dL (ref 8.9–10.3)
Chloride: 104 mmol/L (ref 98–111)
Creatinine, Ser: 0.62 mg/dL (ref 0.44–1.00)
GFR, Estimated: >60 mL/min
Glucose, Bld: 123 mg/dL — ABNORMAL HIGH (ref 70–99)
Potassium: 3.5 mmol/L (ref 3.5–5.1)
Sodium: 137 mmol/L (ref 135–145)
Total Bilirubin: 0.5 mg/dL (ref 0.0–1.2)
Total Protein: 8 g/dL (ref 6.5–8.1)

## 2024-05-26 LAB — CBC
Hematocrit: 44.4 % (ref 34.0–46.6)
Hemoglobin: 14.1 g/dL (ref 11.1–15.9)
MCH: 29.9 pg (ref 26.6–33.0)
MCHC: 31.8 g/dL (ref 31.5–35.7)
MCV: 94 fL (ref 79–97)
Platelets: 191 x10E3/uL (ref 150–450)
RBC: 4.71 x10E6/uL (ref 3.77–5.28)
RDW: 13.2 % (ref 11.7–15.4)
WBC: 5.9 x10E3/uL (ref 3.4–10.8)

## 2024-05-26 LAB — HIV ANTIBODY (ROUTINE TESTING W REFLEX): HIV Screen 4th Generation wRfx: NONREACTIVE

## 2024-05-26 LAB — GLUCOSE TOLERANCE, 2 HOURS W/ 1HR
Glucose, 1 hour: 156 mg/dL (ref 70–179)
Glucose, 2 hour: 96 mg/dL (ref 70–152)
Glucose, Fasting: 69 mg/dL — ABNORMAL LOW (ref 70–91)

## 2024-05-26 LAB — ANTIBODY SCREEN: Antibody Screen: NEGATIVE

## 2024-05-26 LAB — PROTEIN / CREATININE RATIO, URINE
Creatinine, Urine: 35 mg/dL
Total Protein, Urine: <6 mg/dL

## 2024-05-26 LAB — RPR: RPR Ser Ql: NONREACTIVE

## 2024-05-26 MED ORDER — LABETALOL HCL 100 MG PO TABS: 100.0000 mg | ORAL_TABLET | Freq: Two times a day (BID) | ORAL | 0 refills | Status: DC

## 2024-05-26 MED ORDER — LABETALOL HCL 100 MG PO TABS
100.0000 mg | ORAL_TABLET | Freq: Once | ORAL | Status: AC
  Administered 2024-05-26: 100 mg via ORAL
  Filled 2024-05-26: qty 1

## 2024-05-26 NOTE — MAU Provider Note (Signed)
 Maternal Assessment Unit Provider Note  S [name] is a 23 y.o. G2P0010 pregnant female at [redacted]w[redacted]d who presents to MAU today with complaint of abdominal cramping.   She woke up around 12:30 PM this afternoon.  She had an episode of cramping in her abdomen which lasted about 5 minutes.  She went to the bathroom, urinated and had a bowel movement.  Cramping lasted a minute or 2 afterwards. She had another episode of cramping at 1:40pm. Cramping resolved for the last hour.   She reports intercourse last night. Last PO intake about 12:30am.   +FM, no LOF, no vaginal bleeding, no headache, no SOA, no LE swelling.  No known family history of preterm labor.   Receives care at [facility]. Prenatal records reviewed.  Pertinent items noted in HPI and remainder of comprehensive ROS otherwise negative.   O BP 116/88 (BP Location: Right Arm)   Pulse 95   Temp 98.5 F (36.9 C) (Oral)   Resp 16   Ht 5' 3 (1.6 m)   Wt 55.8 kg   LMP 11/16/2023 (Exact Date)   SpO2 100%   BMI 21.79 kg/m  Physical Exam Constitutional:      General: She is not in acute distress.    Appearance: She is not toxic-appearing.  Cardiovascular:     Rate and Rhythm: Normal rate and regular rhythm.  Pulmonary:     Effort: Pulmonary effort is normal.     Breath sounds: Normal breath sounds.  Abdominal:     General: Bowel sounds are normal.     Palpations: Abdomen is soft.     Comments: Gravid uterus, Non tender to palpation  Neurological:     Mental Status: She is alert.    FHT; 135 bpm, mod variability, +accelerations, no decelerations. Cat I Toco: no contractions noted for over 1 hour of monitoring  MDM: Low  MAU Course:  Patient put on toco/electronic fetal monitoring followed by provider assessment. History overall not concerning for labor, likely cramping due to recent intercourse. NST completed with no contractions felt or observed on tocometer prior to discharge. Patient given reassurance  and findings discussed. Patient given counseling for pelvic rest x 72 hrs.   1600 Patient had an elevated blood pressure reading shortly before discharge. PEC labs ordered for baseline. Notably she had an elevated blood pressure reading of 133/93 and 128/90 on 4/21.   Preterm labor signs/symptoms discussed, strict return precautions given. Questions answered to the satisfaction of the patient prior to discharge.    A   ICD-10-CM   1. Abdominal cramping affecting pregnancy  O26.899    R10.9     2. Chronic hypertension affecting pregnancy  O10.919      Medical screening exam complete  P -no contractions on NST, no additional cramping episodes during observation in MAU -Pelvic rest x 72hrs -start on labetelol 100mg  BID, for cHTN, already following with MFM and scheduled for an appt with BPP/dopper on 8/13.   -sent message to FT to for BP check this week  Discharge from MAU in stable condition with strict labor/return precautions  Follow up at [facility] as scheduled for ongoing prenatal care  Allergies as of 05/26/2024       Reactions   Other Other (See Comments)   Seasonal - runny nose, itchy eyes        Medication List     TAKE these medications    acetaminophen  500 MG tablet Commonly known as: TYLENOL   Take 500 mg by mouth every 6 (six) hours as needed for moderate pain (pain score 4-6).   albuterol  108 (90 Base) MCG/ACT inhaler Commonly known as: VENTOLIN  HFA Inhale 1 puff into the lungs every 4 (four) hours as needed.   aspirin  EC 81 MG tablet Take 2 tablets (162 mg total) by mouth daily. Swallow whole.   cyclobenzaprine  5 MG tablet Commonly known as: FLEXERIL  Take 2 tablets (10 mg total) by mouth 3 (three) times daily as needed for muscle spasms.   ondansetron  4 MG disintegrating tablet Commonly known as: ZOFRAN -ODT Take 1 tablet (4 mg total) by mouth every 8 (eight) hours as needed for nausea or vomiting.   prenatal vitamin w/FE, FA 27-1 MG Tabs  tablet Take 1 tablet by mouth daily at 12 noon.   terconazole  0.4 % vaginal cream Commonly known as: TERAZOL 7  Place 1 applicator vaginally at bedtime.   valACYclovir  1000 MG tablet Commonly known as: Valtrex  Take 0.5 tablets (500 mg total) by mouth 2 (two) times daily.        Trudy Leeroy NOVAK, MD 05/26/2024 3:30 PM

## 2024-05-26 NOTE — Discharge Instructions (Signed)

## 2024-05-26 NOTE — MAU Note (Signed)
 Taylor Jefferson is a 23 y.o. at [redacted]w[redacted]d here in MAU reporting: abd pain- all over- NO c/o SROM, vaginal bleeding, HA, chest pain, or visual disturbances.+FM   LMP: na Onset of complaint: 1230 Pain score: 10/10 Vitals:   05/26/24 1406  BP: 116/88  Pulse: 95  Resp: 16  Temp: 98.5 F (36.9 C)  SpO2: 100%     FHT: 138  Lab orders placed from triage: ua

## 2024-05-27 ENCOUNTER — Ambulatory Visit: Payer: Self-pay | Admitting: Obstetrics and Gynecology

## 2024-05-27 ENCOUNTER — Encounter: Payer: Self-pay | Admitting: Obstetrics & Gynecology

## 2024-05-27 DIAGNOSIS — O0990 Supervision of high risk pregnancy, unspecified, unspecified trimester: Secondary | ICD-10-CM

## 2024-05-27 LAB — CMV IGM: CMV IgM Ser EIA-aCnc: 59.2 [AU]/ml — ABNORMAL HIGH (ref 0.0–29.9)

## 2024-05-27 LAB — SPECIMEN STATUS REPORT

## 2024-05-28 ENCOUNTER — Ambulatory Visit (INDEPENDENT_AMBULATORY_CARE_PROVIDER_SITE_OTHER): Admitting: *Deleted

## 2024-05-28 ENCOUNTER — Other Ambulatory Visit: Payer: Self-pay | Admitting: Obstetrics & Gynecology

## 2024-05-28 VITALS — BP 127/87 | HR 76

## 2024-05-28 DIAGNOSIS — O0990 Supervision of high risk pregnancy, unspecified, unspecified trimester: Secondary | ICD-10-CM

## 2024-05-28 DIAGNOSIS — O0992 Supervision of high risk pregnancy, unspecified, second trimester: Secondary | ICD-10-CM

## 2024-05-28 DIAGNOSIS — Z3A27 27 weeks gestation of pregnancy: Secondary | ICD-10-CM

## 2024-05-28 DIAGNOSIS — O10912 Unspecified pre-existing hypertension complicating pregnancy, second trimester: Secondary | ICD-10-CM

## 2024-05-28 DIAGNOSIS — O10919 Unspecified pre-existing hypertension complicating pregnancy, unspecified trimester: Secondary | ICD-10-CM

## 2024-05-28 DIAGNOSIS — O365990 Maternal care for other known or suspected poor fetal growth, unspecified trimester, not applicable or unspecified: Secondary | ICD-10-CM

## 2024-05-28 LAB — POCT URINALYSIS DIPSTICK OB
Blood, UA: NEGATIVE
Glucose, UA: NEGATIVE
Ketones, UA: NEGATIVE
Leukocytes, UA: NEGATIVE
Nitrite, UA: NEGATIVE
POC,PROTEIN,UA: NEGATIVE

## 2024-05-28 NOTE — Progress Notes (Signed)
   NURSE VISIT- BLOOD PRESSURE CHECK  SUBJECTIVE:  Taylor Jefferson is a 23 y.o. G9P0010 female here for BP check. She is [redacted]w[redacted]d pregnant    HYPERTENSION ROS:  Pregnant/postpartum:  Severe headaches that don't go away with tylenol /other medicines: No  Visual changes (seeing spots/double/blurred vision) Yes  Severe pain under right breast breast or in center of upper chest Yes  Severe nausea/vomiting No  Taking medicines as instructed yes    OBJECTIVE:  BP 127/87   Pulse 76   LMP 11/16/2023 (Exact Date)   Appearance alert, well appearing, and in no distress.  ASSESSMENT: Pregnancy [redacted]w[redacted]d  blood pressure check  PLAN: Discussed with Nidia Daring   Recommendations: no changes needed   Follow-up: as scheduled  Pt advised that if chest discomfort worsens or she has difficulty breathing to return to [facility]U. Pt agreeable with plan.    Alan LITTIE Fischer  05/28/2024 9:31 AM

## 2024-06-01 ENCOUNTER — Ambulatory Visit (INDEPENDENT_AMBULATORY_CARE_PROVIDER_SITE_OTHER): Admitting: Licensed Clinical Social Worker

## 2024-06-01 DIAGNOSIS — F330 Major depressive disorder, recurrent, mild: Secondary | ICD-10-CM | POA: Diagnosis not present

## 2024-06-02 ENCOUNTER — Ambulatory Visit

## 2024-06-02 ENCOUNTER — Ambulatory Visit: Admitting: Women's Health

## 2024-06-02 ENCOUNTER — Other Ambulatory Visit (HOSPITAL_COMMUNITY)
Admission: RE | Admit: 2024-06-02 | Discharge: 2024-06-02 | Disposition: A | Discharge status: Home / Self Care | Source: Ambulatory Visit | Attending: Women's Health | Admitting: Women's Health

## 2024-06-02 ENCOUNTER — Encounter: Payer: Self-pay | Admitting: Women's Health

## 2024-06-02 VITALS — BP 114/79 | HR 76 | Wt 129.0 lb

## 2024-06-02 DIAGNOSIS — B258 Other cytomegaloviral diseases: Secondary | ICD-10-CM

## 2024-06-02 DIAGNOSIS — O0993 Supervision of high risk pregnancy, unspecified, third trimester: Secondary | ICD-10-CM | POA: Insufficient documentation

## 2024-06-02 DIAGNOSIS — L292 Pruritus vulvae: Secondary | ICD-10-CM | POA: Insufficient documentation

## 2024-06-02 DIAGNOSIS — Z1389 Encounter for screening for other disorder: Secondary | ICD-10-CM

## 2024-06-02 DIAGNOSIS — Q248 Other specified congenital malformations of heart: Secondary | ICD-10-CM | POA: Diagnosis not present

## 2024-06-02 DIAGNOSIS — O365930 Maternal care for other known or suspected poor fetal growth, third trimester, not applicable or unspecified: Secondary | ICD-10-CM

## 2024-06-02 DIAGNOSIS — Z23 Encounter for immunization: Secondary | ICD-10-CM

## 2024-06-02 DIAGNOSIS — Z331 Pregnant state, incidental: Secondary | ICD-10-CM

## 2024-06-02 DIAGNOSIS — R102 Pelvic and perineal pain: Secondary | ICD-10-CM | POA: Diagnosis present

## 2024-06-02 DIAGNOSIS — O365990 Maternal care for other known or suspected poor fetal growth, unspecified trimester, not applicable or unspecified: Secondary | ICD-10-CM

## 2024-06-02 DIAGNOSIS — Z3A28 28 weeks gestation of pregnancy: Secondary | ICD-10-CM

## 2024-06-02 DIAGNOSIS — O0990 Supervision of high risk pregnancy, unspecified, unspecified trimester: Secondary | ICD-10-CM

## 2024-06-02 LAB — POCT URINALYSIS DIPSTICK OB
Blood, UA: NEGATIVE
Glucose, UA: NEGATIVE
Nitrite, UA: NEGATIVE
POC,PROTEIN,UA: NEGATIVE

## 2024-06-02 NOTE — Progress Notes (Signed)
 US  28+3 wks,frank breech,small/thick fundal placenta gr 2 with marginal cord insertion,FHR 147 bpm, BPP 8/8,RI .90,.94,.93=99% elevated UAD with EDF,abnormal heart axis,AFI 11 cm

## 2024-06-02 NOTE — Progress Notes (Signed)
 HIGH-RISK PREGNANCY VISIT Patient name: Taylor Jefferson MRN [id number]  Date of birth: [birth year] Chief Complaint:   Routine Prenatal Visit (discharge)  History of Present Illness:   Taylor Jefferson is a 23 y.o. G75P0010 female at [redacted]w[redacted]d with an Estimated Date of Delivery: 08/22/24 being seen today for ongoing management of a high-risk pregnancy complicated by chronic hypertension currently on labetalol  100mg  BID, elevated dopplers 99%, fetal growth restriction <1%, and marginal cord insertion.    Today she reports cramping w/ vulvar itching x 2d, normal d/c, no odor. Contractions: Not present.  .  Movement: Present. denies leaking of fluid.      02/09/2024    9:06 AM 05/16/2021    4:53 AM 12/05/2020    1:19 PM 01/31/2020   11:34 AM  Depression screen PHQ 2/9  Decreased Interest 1 2  0  Down, Depressed, Hopeless 0 2  0  PHQ - 2 Score 1 4  0  Altered sleeping 0 1  3  Tired, decreased energy 1 2  2   Change in appetite 1 0  2  Feeling bad or failure about yourself  0 2  0  Trouble concentrating 0 1  0  Moving slowly or fidgety/restless 0 0  1  Suicidal thoughts 0 1    PHQ-9 Score 3 11  8   Difficult doing work/chores  Very difficult       Information is confidential and restricted. Go to Review Flowsheets to unlock data.        02/09/2024    9:06 AM  GAD 7 : Generalized Anxiety Score  Nervous, Anxious, on Edge 0  Control/stop worrying 0  Worry too much - different things 0  Trouble relaxing 0  Restless 0  Easily annoyed or irritable 0  Afraid - awful might happen 0  Total GAD 7 Score 0     Review of Systems:   Pertinent items are noted in HPI Denies abnormal vaginal discharge w/ itching/odor/irritation, headaches, visual changes, shortness of breath, chest pain, abdominal pain, severe nausea/vomiting, or problems with urination or bowel movements unless otherwise stated above. Pertinent History Reviewed:  Reviewed past medical,surgical, social, obstetrical and family  history.  Reviewed problem list, medications and allergies. Physical Assessment:   Vitals:   06/02/24 1509  BP: 114/79  Pulse: 76  Weight: 129 lb (58.5 kg)  Body mass index is 22.85 kg/m.           Physical Examination:   General appearance: alert, well appearing, and in no distress  Mental status: alert, oriented to person, place, and time  Skin: warm & dry   Extremities: Edema: None    Cardiovascular: normal heart rate noted  Respiratory: normal respiratory effort, no distress  Abdomen: gravid, soft, non-tender  Pelvic: spec exam: cx visually long/closed, CV swab obtained         Fetal Status:     Movement: Present    Fetal Surveillance Testing today: US  28+3 wks,frank breech,small/thick fundal placenta gr 2 with marginal cord insertion,FHR 147 bpm, BPP 8/8,RI .90,.94,.93=99% elevated UAD with EDF,abnormal heart axis,AFI 11 cm   Chaperone: Peggy Dones  Results for orders placed or performed in visit on 06/02/24 (from the past 24 hours)  POC Urinalysis Dipstick OB   Collection Time: 06/02/24  4:06 PM  Result Value Ref Range   Color, UA     Clarity, UA     Glucose, UA Negative Negative   Bilirubin, UA     Ketones, UA neative  Spec Grav, UA     Blood, UA negative    pH, UA     POC,PROTEIN,UA Negative Negative, Trace, Small (1+), Moderate (2+), Large (3+), 4+   Urobilinogen, UA     Nitrite, UA negative    Leukocytes, UA Trace (A) Negative   Appearance     Odor      Assessment & Plan:  High-risk pregnancy: G2P0010 at [redacted]w[redacted]d with an Estimated Date of Delivery: 08/22/24   1) cHTN, dx 8/6 MAU visit, stable on labetalol  100mg  BID  2) Severe FGR 1%w/ elevated UAD 99% w/ EDF, BPP 8/8  3) CMV  4) Long axis of fetal heart deviated to Lt> has fetal echo tomorrow  5) Marginal cord insertion  6) Vulvar itching and cramping> CV swab  Meds: No orders of the defined types were placed in this encounter.   Labs/procedures today: spec exam, CV swab, and U/S  Treatment  Plan:   UAD EFW Testing Delivery  Elevated UAD Q 1wk Q 3wk 28w- weekly BPP 32w-add NST 37.0     Reviewed: Preterm labor symptoms and general obstetric precautions including but not limited to vaginal bleeding, contractions, leaking of fluid and fetal movement were reviewed in detail with the patient.  All questions were answered. Does have home bp cuff. Office bp cuff given: not applicable. Check bp weekly, let us  know if consistently >140 and/or >90.  Follow-up: Return for As scheduled.   Future Appointments  Date Time Provider Department Center  06/08/2024  2:15 PM [facility] - FTO[facility]YN US  [facility] None  06/08/2024  3:10 PM Marilynn Nest, DO [facility]TO[facility]YN  06/15/2024  2:15 PM [facility] - FTOBGYN US  [facility] None  06/15/2024  3:10 PM Jayne Vonn DEL, MD [facility] FTOBGYN  06/15/2024  4:30 PM Bret, Harlene R, LCSW [facility] None  06/22/2024  2:15 PM [facility] - FTOBGYN US  [facility] None  06/22/2024  3:10 PM Jayne Vonn DEL, MD [facility] FTOBGYN  06/28/2024  3:00 PM [facility] - FTOBGYN US  [facility] None  06/28/2024  3:50 PM Marilynn Nest, DO [facility] FTO[facility]YN  06/29/2024  8:00 AM Bret, Harlene R, LCSW [facility] None  07/01/2024  3:30 PM [facility] NURSE [facility] FTOBGYN  07/05/2024  3:00 PM [facility] - FTOBGYN US  [facility] None  07/05/2024  3:50 PM Marilynn Nest, DO [facility] FTOBGYN  07/08/2024  3:30 PM [facility] NURSE [facility] FTOBGYN  07/12/2024  1:30 PM [facility] - FTOBGYN US  [facility] None  07/12/2024  2:30 PM Jayne Vonn DEL, MD [facility] FTOBGYN  07/13/2024  8:00 AM Bret [name], LCSW [facility] None  07/15/2024  3:30 PM [facility] NURSE [facility] FTOBGYN  07/19/2024  3:30 PM [facility] NURSE [facility] FTOBGYN  07/19/2024  3:50 PM Kizzie Suzen SAUNDERS, CNM [facility] FTOBGYN  07/22/2024  3:30 PM [facility] NURSE [facility] FTOBGYN  07/26/2024  3:00 PM [facility] - FTOBGYN US  [facility] None  07/29/2024  3:30 PM [facility] NURSE [facility] FTOBGYN  08/02/2024  3:00 PM [facility] - FTOBGYN US  [facility] None  08/05/2024 10:30 AM [facility] NURSE [facility] FTOBGYN   08/09/2024  3:00 PM [facility] - FTOBGYN US  [facility] None  08/12/2024  3:30 PM [facility] NURSE [facility] FTOBGYN  08/16/2024  3:00 PM [facility] - FTOBGYN US  [facility] None  08/19/2024  3:30 PM [facility] NURSE [facility] FTOBGYN    Orders Placed This Encounter  Procedures   Tdap vaccine greater than or equal to 7yo IM   POC Urinalysis Dipstick OB   Suzen SAUNDERS Kizzie CNM, [facility] 06/02/2024 4:56 PM

## 2024-06-02 NOTE — Patient Instructions (Signed)
 [facility], thank you for choosing our office today! We appreciate the opportunity to meet your healthcare needs. You may receive a short survey by mail, e-mail, or through [employer]. If you are happy with your care we would appreciate if you could take just a few minutes to complete the survey questions. We read all of your comments and take your feedback very seriously. Thank you again for choosing our office.  Center for [employer] Team at [facility]  [facility] ([address] [place], [place] [address]) Entrance C, located off of [address] Free 24/7 valet parking   CLASSES: Go to [email or web address] to register for classes (childbirth, breastfeeding, waterbirth, infant CPR, daddy bootcamp, etc.)  Call the office [phone]) or go to [facility] if: You begin to have strong, frequent contractions Your water  breaks.  Sometimes it is a big gush of fluid, sometimes it is just a trickle that keeps getting your panties wet or running down your legs You have vaginal bleeding.  It is normal to have a small amount of spotting if your cervix was checked.  You don't feel your baby moving like normal.  If you don't, get you something to eat and drink and lay down and focus on feeling your baby move.   If your baby is still not moving like normal, you should call the office or go to [facility].  Call the office [phone]) or go to [facility] for these signs of pre-eclampsia: Severe headache that does not go away with Tylenol  Visual changes- seeing spots, double, blurred vision Pain under your right breast or upper abdomen that does not go away with Tums or heartburn medicine Nausea and/or vomiting Severe swelling in your hands, feet, and face   Tdap Vaccine It is recommended that you get the Tdap vaccine during the third trimester of EACH pregnancy to help protect your baby from getting pertussis (whooping cough) 27-36 weeks is the BEST time to do  this so that you can pass the protection on to your baby. During pregnancy is better than after pregnancy, but if you are unable to get it during pregnancy it will be offered at the hospital.  You can get this vaccine with us , at the health department, your family doctor, or some local pharmacies Everyone who will be around your baby should also be up-to-date on their vaccines before the baby comes. Adults (who are not pregnant) only need 1 dose of Tdap during adulthood.   [facility] Pediatricians/Family Doctors [facility]): [address] [place], [phone]           [facility]: [address], [phone]                [facility]): [address], [phone] (call to ask if accepting patients) [facility] Department: [address], [place], [phone]    [facility] Pediatricians/Family Doctors [facility]): [phone] [address], [phone] [facility]: [address] [place], [phone] [facility]: [address], [phone]  [facility] Doctors  [facility]): [phone] [facility]: [address] [phone]   [facility] Doctors [facility]: [address] [phone]  [facility] Family Doctors  [facility]: [phone], [phone]  Home Blood Pressure Monitoring for Patients   Your provider has recommended that you check your  blood pressure (BP) at least once a week at home. If you do not have a blood pressure cuff at home, one will be provided for you. Contact your provider if you have not received your monitor within 1 week.   Helpful Tips for Accurate Home Blood Pressure Checks  Don't smoke, exercise, or drink caffeine  30 minutes before checking your BP Use the restroom before checking your BP (a full bladder can raise your  pressure) Relax in a comfortable upright chair Feet on the ground Left arm resting comfortably on a flat surface at the level of your heart Legs uncrossed Back supported Sit quietly and don't talk Place the cuff on your bare arm Adjust snuggly, so that only two fingertips can fit between your skin and the top of the cuff Check 2 readings separated by at least one minute Keep a log of your BP readings For a visual, please reference this diagram: http://ccnc.care/bpdiagram  Provider Name: [facility]     Phone: [phone]  Zone 1: ALL CLEAR  Continue to monitor your symptoms:  BP reading is less than 140 (top number) or less than 90 (bottom number)  No right upper stomach pain No headaches or seeing spots No feeling nauseated or throwing up No swelling in face and hands  Zone 2: CAUTION Call your doctor's office for any of the following:  BP reading is greater than 140 (top number) or greater than 90 (bottom number)  Stomach pain under your ribs in the middle or right side Headaches or seeing spots Feeling nauseated or throwing up Swelling in face and hands  Zone 3: EMERGENCY  Seek immediate medical care if you have any of the following:  BP reading is greater than160 (top number) or greater than 110 (bottom number) Severe headaches not improving with Tylenol  Serious difficulty catching your breath Any worsening symptoms from Zone 2   Third Trimester of Pregnancy The third trimester is from week 29 through week 42, months 7 through 9. The third trimester is a time when the fetus is growing rapidly. At the end of the ninth month, the fetus is about 20 inches in length and weighs 6-10 pounds.  BODY CHANGES Your body goes through many changes during pregnancy. The changes vary from woman to woman.  Your weight will continue to increase. You can expect to gain 25-35 pounds (11-16 kg) by the end of the pregnancy. You may begin to get stretch marks on your hips, abdomen,  and breasts. You may urinate more often because the fetus is moving lower into your pelvis and pressing on your bladder. You may develop or continue to have heartburn as a result of your pregnancy. You may develop constipation because certain hormones are causing the muscles that push waste through your intestines to slow down. You may develop hemorrhoids or swollen, bulging veins (varicose veins). You may have pelvic pain because of the weight gain and pregnancy hormones relaxing your joints between the bones in your pelvis. Backaches may result from overexertion of the muscles supporting your posture. You may have changes in your hair. These can include thickening of your hair, rapid growth, and changes in texture. Some women also have hair loss during or after pregnancy, or hair that feels dry or thin. Your hair will most likely return to normal after your baby is born. Your breasts will continue to grow and be tender. A yellow discharge may leak from your breasts called colostrum. Your belly button may stick out. You may  feel short of breath because of your expanding uterus. You may notice the fetus dropping, or moving lower in your abdomen. You may have a bloody mucus discharge. This usually occurs a few days to a week before labor begins. Your cervix becomes thin and soft (effaced) near your due date. WHAT TO EXPECT AT YOUR PRENATAL EXAMS  You will have prenatal exams every 2 weeks until week 36. Then, you will have weekly prenatal exams. During a routine prenatal visit: You will be weighed to make sure you and the fetus are growing normally. Your blood pressure is taken. Your abdomen will be measured to track your baby's growth. The fetal heartbeat will be listened to. Any test results from the previous visit will be discussed. You may have a cervical check near your due date to see if you have effaced. At around 36 weeks, your caregiver will check your cervix. At the same time, your  caregiver will also perform a test on the secretions of the vaginal tissue. This test is to determine if a type of bacteria, Group B streptococcus, is present. Your caregiver will explain this further. Your caregiver may ask you: What your birth plan is. How you are feeling. If you are feeling the baby move. If you have had any abnormal symptoms, such as leaking fluid, bleeding, severe headaches, or abdominal cramping. If you have any questions. Other tests or screenings that may be performed during your third trimester include: Blood tests that check for low iron levels (anemia). Fetal testing to check the health, activity level, and growth of the fetus. Testing is done if you have certain medical conditions or if there are problems during the pregnancy. FALSE LABOR You may feel small, irregular contractions that eventually go away. These are called Braxton Hicks contractions, or false labor. Contractions may last for hours, days, or even weeks before true labor sets in. If contractions come at regular intervals, intensify, or become painful, it is best to be seen by your caregiver.  SIGNS OF LABOR  Menstrual-like cramps. Contractions that are 5 minutes apart or less. Contractions that start on the top of the uterus and spread down to the lower abdomen and back. A sense of increased pelvic pressure or back pain. A watery or bloody mucus discharge that comes from the vagina. If you have any of these signs before the 37th week of pregnancy, call your caregiver right away. You need to go to the hospital to get checked immediately. HOME CARE INSTRUCTIONS  Avoid all smoking, herbs, alcohol, and unprescribed drugs. These chemicals affect the formation and growth of the baby. Follow your caregiver's instructions regarding medicine use. There are medicines that are either safe or unsafe to take during pregnancy. Exercise only as directed by your caregiver. Experiencing uterine cramps is a good sign to  stop exercising. Continue to eat regular, healthy meals. Wear a good support bra for breast tenderness. Do not use hot tubs, steam rooms, or saunas. Wear your seat belt at all times when driving. Avoid raw meat, uncooked cheese, cat litter boxes, and soil used by cats. These carry germs that can cause birth defects in the baby. Take your prenatal vitamins. Try taking a stool softener (if your caregiver approves) if you develop constipation. Eat more high-fiber foods, such as fresh vegetables or fruit and whole grains. Drink plenty of fluids to keep your urine clear or pale yellow. Take warm sitz baths to soothe any pain or discomfort caused by hemorrhoids. Use hemorrhoid cream if  your caregiver approves. If you develop varicose veins, wear support hose. Elevate your feet for 15 minutes, 3-4 times a day. Limit salt in your diet. Avoid heavy lifting, wear low heal shoes, and practice good posture. Rest a lot with your legs elevated if you have leg cramps or low back pain. Visit your dentist if you have not gone during your pregnancy. Use a soft toothbrush to brush your teeth and be gentle when you floss. A sexual relationship may be continued unless your caregiver directs you otherwise. Do not travel far distances unless it is absolutely necessary and only with the approval of your caregiver. Take prenatal classes to understand, practice, and ask questions about the labor and delivery. Make a trial run to the hospital. Pack your hospital bag. Prepare the baby's nursery. Continue to go to all your prenatal visits as directed by your caregiver. SEEK MEDICAL CARE IF: You are unsure if you are in labor or if your water  has broken. You have dizziness. You have mild pelvic cramps, pelvic pressure, or nagging pain in your abdominal area. You have persistent nausea, vomiting, or diarrhea. You have a bad smelling vaginal discharge. You have pain with urination. SEEK IMMEDIATE MEDICAL CARE IF:  You  have a fever. You are leaking fluid from your vagina. You have spotting or bleeding from your vagina. You have severe abdominal cramping or pain. You have rapid weight loss or gain. You have shortness of breath with chest pain. You notice sudden or extreme swelling of your face, hands, ankles, feet, or legs. You have not felt your baby move in over an hour. You have severe headaches that do not go away with medicine. You have vision changes. Document Released: 10/01/2001 Document Revised: 10/12/2013 Document Reviewed: 12/08/2012 [facility] Patient Information 2015 [place], [place]. This information is not intended to replace advice given to you by your health care provider. Make sure you discuss any questions you have with your health care provider.

## 2024-06-04 LAB — CERVICOVAGINAL ANCILLARY ONLY
Bacterial Vaginitis (gardnerella): POSITIVE — AB
Candida Glabrata: NEGATIVE
Candida Vaginitis: POSITIVE — AB
Chlamydia: NEGATIVE
Comment: NEGATIVE
Comment: NEGATIVE
Comment: NEGATIVE
Comment: NEGATIVE
Comment: NEGATIVE
Comment: NORMAL
Neisseria Gonorrhea: NEGATIVE
Trichomonas: NEGATIVE

## 2024-06-05 ENCOUNTER — Inpatient Hospital Stay (HOSPITAL_COMMUNITY)
Admission: AD | Admit: 2024-06-05 | Discharge: 2024-06-05 | Disposition: A | Discharge status: Home / Self Care | Payer: Self-pay | Attending: Obstetrics and Gynecology | Admitting: Obstetrics and Gynecology

## 2024-06-05 ENCOUNTER — Encounter (HOSPITAL_COMMUNITY): Payer: Self-pay | Admitting: Obstetrics and Gynecology

## 2024-06-05 DIAGNOSIS — O10913 Unspecified pre-existing hypertension complicating pregnancy, third trimester: Secondary | ICD-10-CM | POA: Diagnosis not present

## 2024-06-05 DIAGNOSIS — O368130 Decreased fetal movements, third trimester, not applicable or unspecified: Secondary | ICD-10-CM | POA: Diagnosis present

## 2024-06-05 DIAGNOSIS — Z3689 Encounter for other specified antenatal screening: Secondary | ICD-10-CM | POA: Diagnosis not present

## 2024-06-05 DIAGNOSIS — Z3A28 28 weeks gestation of pregnancy: Secondary | ICD-10-CM | POA: Diagnosis not present

## 2024-06-05 DIAGNOSIS — Z7982 Long term (current) use of aspirin: Secondary | ICD-10-CM | POA: Insufficient documentation

## 2024-06-05 DIAGNOSIS — O10919 Unspecified pre-existing hypertension complicating pregnancy, unspecified trimester: Secondary | ICD-10-CM

## 2024-06-05 LAB — URINALYSIS, ROUTINE W REFLEX MICROSCOPIC
Bilirubin Urine: NEGATIVE
Glucose, UA: NEGATIVE mg/dL
Hgb urine dipstick: NEGATIVE
Ketones, ur: NEGATIVE mg/dL
Nitrite: NEGATIVE
Protein, ur: NEGATIVE mg/dL
Specific Gravity, Urine: 1.014 (ref 1.005–1.030)
pH: 6 (ref 5.0–8.0)

## 2024-06-05 NOTE — MAU Provider Note (Signed)
 Event Date/Time   First Provider Initiated Contact with Patient 06/05/24 2118      S [name] is a 23 y.o. G2P0010 patient who presents to MAU today with complaint of decreased fetal movement today.  Patient reports she has felt some movements since she presented here in the MAU.  She denies any leaking of fluid, vaginal bleeding, contractions.  Patient's prenatal course is complicated by chronic hypertension she is currently on labetalol  100 mg twice daily and reports she did not take this evening's dose.  She is also taking baby aspirin  daily in the evening and which she did not take tonight.  Her pregnancy course is also complicated by severe FGR, CMV, long axis of fetal heart deviated to the left (has fetal echo scheduled), marginal cord insertion    O BP 130/87   Pulse 64   Temp 98.4 F (36.9 C) (Oral)   Resp 16   Ht 5' 3 (1.6 m)   Wt 59.8 kg   LMP 11/16/2023 (Exact Date)   SpO2 100%   BMI 23.35 kg/m   Vitals:   06/05/24 2023 06/05/24 2028 06/05/24 2050 06/05/24 2100  BP: 131/89 (!) 145/91 (!) 146/98 130/87   06/05/24 2124 06/05/24 2130  BP: (!) 146/91 121/86    Physical Exam Vitals and nursing note reviewed.  Constitutional:      Appearance: Normal appearance.  HENT:     Head: Normocephalic.     Nose: Nose normal.     Mouth/Throat:     Mouth: Mucous membranes are moist.  Cardiovascular:     Rate and Rhythm: Normal rate.  Pulmonary:     Effort: Pulmonary effort is normal.  Abdominal:     Palpations: Abdomen is soft.  Musculoskeletal:     Cervical back: Normal range of motion.  Skin:    General: Skin is warm.  Neurological:     Mental Status: She is alert and oriented to person, place, and time.  Psychiatric:        Mood and Affect: Mood normal.        Behavior: Behavior normal.       FHRT Cat 1 reactive for GA  Baseline Variability Accelerations Decelerations TOCO  MDM  MODERATE  DFM at [redacted] weeks GA with known cHTN  NST Cat 1  reactive for GA  -Patient acknowledges good FM's here as evidenced by the clicker on the tracing and her verbal report -Reassurance provided -Fetal kick counts reviewed and encouraged - Patient reported she had not taken her evening dose of Labetalol  yet but would take at home. She is also taking ASA daily at night  with compliance .( BP's in Mild Range and patient asymptomatic for s/s of preeclampsia)    Orders Placed This Encounter  Procedures   Urinalysis, Routine w reflex microscopic -Urine, Clean Catch    Standing Status:   Standing    Number of Occurrences:   1    Specimen Source:   Urine, Clean Catch [76]    No orders of the defined types were placed in this encounter.      Results for orders placed or performed during the hospital encounter of 06/05/24 (from the past 24 hours)  Urinalysis, Routine w reflex microscopic -Urine, Clean Catch     Status: Abnormal   Collection Time: 06/05/24  8:50 PM  Result Value Ref Range   Color, Urine YELLOW YELLOW   APPearance HAZY (A) CLEAR   Specific Gravity, Urine 1.014 1.005 - 1.030  pH 6.0 5.0 - 8.0   Glucose, UA NEGATIVE NEGATIVE mg/dL   Hgb urine dipstick NEGATIVE NEGATIVE   Bilirubin Urine NEGATIVE NEGATIVE   Ketones, ur NEGATIVE NEGATIVE mg/dL   Protein, ur NEGATIVE NEGATIVE mg/dL   Nitrite NEGATIVE NEGATIVE   Leukocytes,Ua TRACE (A) NEGATIVE   RBC / HPF 0-5 0 - 5 RBC/hpf   WBC, UA 0-5 0 - 5 WBC/hpf   Bacteria, UA RARE (A) NONE SEEN   Squamous Epithelial / HPF 11-20 0 - 5 /HPF   Mucus PRESENT        I have reviewed the patient chart and performed the physical exam . I have ordered & interpreted the lab results and reviewed and interpreted the NST Medications ordered as stated below.  A/P as described below.  Counseling and education provided and patient agreeable  with plan as described below. Verbalized understanding.    ASSESSMENT Medical screening exam complete  Decreased fetal movements in third trimester,  single or unspecified fetus  [redacted] weeks gestation of pregnancy  NST (non-stress test) reactive on fetal surveillance  Chronic hypertension affecting pregnancy    PLAN Future Appointments  Date Time Provider Department Center  06/08/2024  2:15 PM [facility] - [facility]YN US  [facility] None  06/08/2024  3:10 PM Marilynn Nest, DO [facility]  06/15/2024  2:15 PM [facility] - [facility] US  [facility] None  06/15/2024  3:10 PM Jayne Vonn DEL, MD [facility]  06/15/2024  4:30 PM Bret, Harlene R, LCSW [facility] None  06/22/2024  2:15 PM [facility] - [facility] US  [facility] None  06/22/2024  3:10 PM Jayne Vonn DEL, MD [facility]  06/28/2024  3:00 PM [facility] - [facility] US  [facility] None  06/28/2024  3:50 PM Marilynn Nest, DO [facility]  06/29/2024  8:00 AM Bret, Harlene R, LCSW [facility] None  07/01/2024  3:30 PM [facility] NURSE [facility]  07/05/2024  3:00 PM [facility] - [facility] US  [facility] None  07/05/2024  3:50 PM Marilynn Nest, DO [facility]  07/08/2024  3:30 PM [facility] NURSE [facility]  07/12/2024  1:30 PM [facility] - [facility] US  [facility] None  07/12/2024  2:30 PM Jayne Vonn DEL, MD [facility]  07/13/2024  8:00 AM Bret, [name], LCSW [facility] None  07/15/2024  3:30 PM [facility] NURSE [facility]  07/19/2024  3:30 PM [facility] NURSE [facility]  07/19/2024  3:50 PM Kizzie Suzen SAUNDERS, CNM [facility]  07/22/2024  3:30 PM [facility] NURSE [facility]  07/26/2024  3:00 PM [facility] - [facility] US  [facility] None  07/26/2024  3:50 PM Marilynn Nest, DO [facility]  07/29/2024  3:30 PM [facility] NURSE [facility]  08/02/2024  3:00 PM [facility] - [facility] US  [facility] None  08/02/2024  3:50 PM Kizzie Suzen SAUNDERS, CNM [facility]  08/05/2024 10:30 AM [facility] NURSE [facility]  08/09/2024  3:00 PM [facility] - [facility] US  [facility] None  08/09/2024  3:50 PM Jayne Vonn DEL, MD [facility]  08/12/2024  3:30 PM [facility] NURSE [facility]  08/16/2024  3:00 PM [facility] - [facility] US  [facility] None   08/16/2024  3:50 PM Marilynn Nest, DO [facility]  08/19/2024  3:30 PM [facility] NURSE [facility]    Discharge from MAU in stable condition  See AVS for full description of educational information and instructions provided to the patient at time of discharge   Warning signs for worsening condition that would warrant emergency follow-up discussed Patient may return to MAU as needed   Littie Olam LABOR, NP 06/05/2024 9:19 PM

## 2024-06-05 NOTE — MAU Note (Addendum)
 MAU Triage Note:  .Taylor Jefferson is a 23 y.o. at [redacted]w[redacted]d here in MAU reporting: has not felt any fetal movement since this morning. Has tried drinking cold fluids, eating, and pressing on baby -  none of these interventions have helped. Denies pain, VB, or LOF. Has CHTN and takes labetalol .  Patient complaint: DFM  Pain Score: 0-No pain     Onset of complaint: today LMP: Patient's last menstrual period was 11/16/2023 (exact date).  Vitals:   06/05/24 2023  BP: 131/89  Pulse: 65  Resp: 16  Temp: 98.4 F (36.9 C)  SpO2: 100%    FHT:  Fetal Heart Rate Mode: Doppler Baseline Rate (A): 147 bpm Lab orders placed from triage: N/A

## 2024-06-06 ENCOUNTER — Encounter (HOSPITAL_COMMUNITY): Payer: Self-pay | Admitting: Licensed Clinical Social Worker

## 2024-06-06 NOTE — Progress Notes (Signed)
   THERAPIST PROGRESS NOTE  Session Time: 4:30pm-5:30pm  Participation Level: Active  Behavioral Response: CasualAlertEuthymic  Type of Therapy: Individual Therapy  Treatment Goals addressed:  Goal: LTG: Reduce frequency, intensity, and duration of depression symptoms so that daily functioning is improved     Dates: Start:  10/21/23    Expected End:  04/19/24       Disciplines: Interdisciplinary, PROVIDER                    Goal: LTG: Increase coping skills to manage depression and improve ability to perform daily activities     Dates: Start:  10/21/23    Expected End:  04/19/24       Disciplines: Interdisciplinary, PROVIDER      ProgressTowards Goals: Progressing  Interventions: CBT  Summary: Taylor Jefferson is a 23 y.o. female who presents with MDD, mild.   Suicidal/Homicidal: Nowithout intent/plan  Therapist Response: [name] engaged well in individual in person session with clinician. Clinician utilized CBT to process thoughts, feelings, and interactions. Clinician identified continuing concerns about pregnancy and growth of baby. Clinician processed feelings and worries about baby's health, as well as her ability to carry him as close to full term as possible. Clinician processed feelings about becoming a mother and how things have changed over the past several months in her attitude about her future, her choices, and who gets to be around her. Clinician noted much more emotional and physical closeness to her mother, and the important role mother has and will continue to play for [name].   Plan: Return again in 2 weeks.  Diagnosis: MDD (major depressive disorder), recurrent episode, mild (HCC)  Collaboration of Care: Other provider involved in patient's care AEB reviewed med notes about pregnancy  Patient/Guardian was advised Release of Information must be obtained prior to any record release in order to collaborate their care with an outside provider. Patient/Guardian  was advised if they have not already done so to contact the registration department to sign all necessary forms in order for us  to release information regarding their care.   Consent: Patient/Guardian gives verbal consent for treatment and assignment of benefits for services provided during this visit. Patient/Guardian expressed understanding and agreed to proceed.   [name] [place], LCSW 06/06/2024

## 2024-06-07 ENCOUNTER — Other Ambulatory Visit: Payer: Self-pay | Admitting: Women's Health

## 2024-06-07 ENCOUNTER — Other Ambulatory Visit: Payer: Self-pay | Admitting: Obstetrics & Gynecology

## 2024-06-07 ENCOUNTER — Ambulatory Visit: Payer: Self-pay | Admitting: Women's Health

## 2024-06-07 DIAGNOSIS — O365990 Maternal care for other known or suspected poor fetal growth, unspecified trimester, not applicable or unspecified: Secondary | ICD-10-CM

## 2024-06-07 MED ORDER — MICONAZOLE NITRATE 2 % VA CREA: 1.0000 | TOPICAL_CREAM | Freq: Every day | VAGINAL | 0 refills | Status: DC

## 2024-06-07 MED ORDER — METRONIDAZOLE 500 MG PO TABS: 500.0000 mg | ORAL_TABLET | Freq: Two times a day (BID) | ORAL | 0 refills | Status: DC

## 2024-06-07 MED ORDER — TERCONAZOLE 0.4 % VA CREA: 1.0000 | TOPICAL_CREAM | Freq: Every day | VAGINAL | 0 refills | Status: DC

## 2024-06-08 ENCOUNTER — Ambulatory Visit (INDEPENDENT_AMBULATORY_CARE_PROVIDER_SITE_OTHER): Admitting: Obstetrics & Gynecology

## 2024-06-08 ENCOUNTER — Encounter: Payer: Self-pay | Admitting: Obstetrics and Gynecology

## 2024-06-08 ENCOUNTER — Ambulatory Visit (INDEPENDENT_AMBULATORY_CARE_PROVIDER_SITE_OTHER)

## 2024-06-08 ENCOUNTER — Encounter (HOSPITAL_COMMUNITY): Payer: Self-pay | Admitting: Obstetrics and Gynecology

## 2024-06-08 ENCOUNTER — Other Ambulatory Visit: Payer: Self-pay | Admitting: Obstetrics & Gynecology

## 2024-06-08 ENCOUNTER — Inpatient Hospital Stay (HOSPITAL_COMMUNITY)
Admission: AD | Admit: 2024-06-08 | Discharge: 2024-06-24 | DRG: 832 | Disposition: A | Discharge status: Other Acute Inpatient Hospital | Attending: Obstetrics & Gynecology | Admitting: Obstetrics & Gynecology

## 2024-06-08 ENCOUNTER — Encounter: Payer: Self-pay | Admitting: Obstetrics & Gynecology

## 2024-06-08 ENCOUNTER — Other Ambulatory Visit: Payer: Self-pay

## 2024-06-08 VITALS — BP 121/84 | HR 80 | Wt 127.6 lb

## 2024-06-08 DIAGNOSIS — O43193 Other malformation of placenta, third trimester: Secondary | ICD-10-CM | POA: Diagnosis present

## 2024-06-08 DIAGNOSIS — O365930 Maternal care for other known or suspected poor fetal growth, third trimester, not applicable or unspecified: Secondary | ICD-10-CM

## 2024-06-08 DIAGNOSIS — Z7982 Long term (current) use of aspirin: Secondary | ICD-10-CM

## 2024-06-08 DIAGNOSIS — O35BXX0 Maternal care for other (suspected) fetal abnormality and damage, fetal cardiac anomalies, not applicable or unspecified: Secondary | ICD-10-CM | POA: Diagnosis not present

## 2024-06-08 DIAGNOSIS — Z8249 Family history of ischemic heart disease and other diseases of the circulatory system: Secondary | ICD-10-CM

## 2024-06-08 DIAGNOSIS — O10913 Unspecified pre-existing hypertension complicating pregnancy, third trimester: Secondary | ICD-10-CM | POA: Diagnosis present

## 2024-06-08 DIAGNOSIS — Z3A29 29 weeks gestation of pregnancy: Secondary | ICD-10-CM

## 2024-06-08 DIAGNOSIS — O99343 Other mental disorders complicating pregnancy, third trimester: Secondary | ICD-10-CM | POA: Diagnosis present

## 2024-06-08 DIAGNOSIS — K120 Recurrent oral aphthae: Secondary | ICD-10-CM | POA: Diagnosis present

## 2024-06-08 DIAGNOSIS — A6000 Herpesviral infection of urogenital system, unspecified: Secondary | ICD-10-CM | POA: Diagnosis present

## 2024-06-08 DIAGNOSIS — O0990 Supervision of high risk pregnancy, unspecified, unspecified trimester: Secondary | ICD-10-CM

## 2024-06-08 DIAGNOSIS — D696 Thrombocytopenia, unspecified: Secondary | ICD-10-CM | POA: Diagnosis present

## 2024-06-08 DIAGNOSIS — O365990 Maternal care for other known or suspected poor fetal growth, unspecified trimester, not applicable or unspecified: Secondary | ICD-10-CM

## 2024-06-08 DIAGNOSIS — F331 Major depressive disorder, recurrent, moderate: Secondary | ICD-10-CM | POA: Diagnosis not present

## 2024-06-08 DIAGNOSIS — O368330 Maternal care for abnormalities of the fetal heart rate or rhythm, third trimester, not applicable or unspecified: Secondary | ICD-10-CM | POA: Diagnosis present

## 2024-06-08 DIAGNOSIS — Z202 Contact with and (suspected) exposure to infections with a predominantly sexual mode of transmission: Secondary | ICD-10-CM | POA: Diagnosis present

## 2024-06-08 DIAGNOSIS — O99891 Other specified diseases and conditions complicating pregnancy: Secondary | ICD-10-CM | POA: Diagnosis present

## 2024-06-08 DIAGNOSIS — F32A Depression, unspecified: Secondary | ICD-10-CM | POA: Diagnosis present

## 2024-06-08 DIAGNOSIS — O10919 Unspecified pre-existing hypertension complicating pregnancy, unspecified trimester: Secondary | ICD-10-CM | POA: Insufficient documentation

## 2024-06-08 DIAGNOSIS — O98113 Syphilis complicating pregnancy, third trimester: Secondary | ICD-10-CM | POA: Diagnosis not present

## 2024-06-08 DIAGNOSIS — Z79899 Other long term (current) drug therapy: Secondary | ICD-10-CM | POA: Diagnosis not present

## 2024-06-08 DIAGNOSIS — O288 Other abnormal findings on antenatal screening of mother: Secondary | ICD-10-CM | POA: Diagnosis not present

## 2024-06-08 DIAGNOSIS — Z87891 Personal history of nicotine dependence: Secondary | ICD-10-CM | POA: Diagnosis not present

## 2024-06-08 DIAGNOSIS — Z3A31 31 weeks gestation of pregnancy: Secondary | ICD-10-CM | POA: Diagnosis not present

## 2024-06-08 DIAGNOSIS — O321XX0 Maternal care for breech presentation, not applicable or unspecified: Secondary | ICD-10-CM | POA: Diagnosis present

## 2024-06-08 DIAGNOSIS — Z369 Encounter for antenatal screening, unspecified: Secondary | ICD-10-CM | POA: Diagnosis not present

## 2024-06-08 DIAGNOSIS — O99119 Other diseases of the blood and blood-forming organs and certain disorders involving the immune mechanism complicating pregnancy, unspecified trimester: Secondary | ICD-10-CM | POA: Diagnosis not present

## 2024-06-08 DIAGNOSIS — O99113 Other diseases of the blood and blood-forming organs and certain disorders involving the immune mechanism complicating pregnancy, third trimester: Secondary | ICD-10-CM | POA: Diagnosis present

## 2024-06-08 DIAGNOSIS — O10013 Pre-existing essential hypertension complicating pregnancy, third trimester: Secondary | ICD-10-CM | POA: Diagnosis not present

## 2024-06-08 DIAGNOSIS — O98313 Other infections with a predominantly sexual mode of transmission complicating pregnancy, third trimester: Secondary | ICD-10-CM | POA: Diagnosis present

## 2024-06-08 DIAGNOSIS — Z3A30 30 weeks gestation of pregnancy: Secondary | ICD-10-CM | POA: Diagnosis not present

## 2024-06-08 DIAGNOSIS — O43192 Other malformation of placenta, second trimester: Principal | ICD-10-CM

## 2024-06-08 DIAGNOSIS — R7401 Elevation of levels of liver transaminase levels: Secondary | ICD-10-CM | POA: Diagnosis not present

## 2024-06-08 DIAGNOSIS — O133 Gestational [pregnancy-induced] hypertension without significant proteinuria, third trimester: Secondary | ICD-10-CM | POA: Diagnosis not present

## 2024-06-08 HISTORY — DX: Recurrent oral aphthae: K12.0

## 2024-06-08 LAB — COMPREHENSIVE METABOLIC PANEL WITH GFR
ALT: 19 U/L (ref 0–44)
AST: 19 U/L (ref 15–41)
Albumin: 3.2 g/dL — ABNORMAL LOW (ref 3.5–5.0)
Alkaline Phosphatase: 136 U/L — ABNORMAL HIGH (ref 38–126)
Anion gap: 9 (ref 5–15)
BUN: 8 mg/dL (ref 6–20)
CO2: 20 mmol/L — ABNORMAL LOW (ref 22–32)
Calcium: 8.9 mg/dL (ref 8.9–10.3)
Chloride: 105 mmol/L (ref 98–111)
Creatinine, Ser: 0.59 mg/dL (ref 0.44–1.00)
GFR, Estimated: >60 mL/min (ref >60)
Glucose, Bld: 71 mg/dL (ref 70–99)
Potassium: 3.8 mmol/L (ref 3.5–5.1)
Sodium: 134 mmol/L — ABNORMAL LOW (ref 135–145)
Total Bilirubin: 0.6 mg/dL (ref 0.0–1.2)
Total Protein: 7.4 g/dL (ref 6.5–8.1)

## 2024-06-08 LAB — CBC
HCT: 39.4 % (ref 36.0–46.0)
Hemoglobin: 13.5 g/dL (ref 12.0–15.0)
MCH: 30.1 pg (ref 26.0–34.0)
MCHC: 34.3 g/dL (ref 30.0–36.0)
MCV: 87.9 fL (ref 80.0–100.0)
Platelets: 173 K/uL (ref 150–400)
RBC: 4.48 MIL/uL (ref 3.87–5.11)
RDW: 12.2 % (ref 11.5–15.5)
WBC: 6 K/uL (ref 4.0–10.5)
nRBC: 0 % (ref 0.0–0.2)

## 2024-06-08 LAB — TYPE AND SCREEN
ABO/RH(D): B POS
Antibody Screen: NEGATIVE

## 2024-06-08 LAB — PROTEIN / CREATININE RATIO, URINE
Creatinine, Urine: 34 mg/dL
Total Protein, Urine: <6 mg/dL

## 2024-06-08 MED ORDER — BETAMETHASONE SOD PHOS & ACET 6 (3-3) MG/ML IJ SUSP
12.0000 mg | INTRAMUSCULAR | Status: AC
  Administered 2024-06-08 – 2024-06-09 (×2): 12 mg via INTRAMUSCULAR
  Filled 2024-06-08: qty 5

## 2024-06-08 MED ORDER — LACTATED RINGERS IV SOLN: 125.0000 mL/h | INTRAVENOUS | Status: DC

## 2024-06-08 MED ORDER — MAGNESIUM SULFATE BOLUS VIA INFUSION
4.0000 g | Freq: Once | INTRAVENOUS | Status: AC
  Administered 2024-06-08: 4 g via INTRAVENOUS
  Filled 2024-06-08: qty 1000

## 2024-06-08 MED ORDER — MAGNESIUM SULFATE 40 GM/1000ML IV SOLN
2.0000 g/h | INTRAVENOUS | Status: AC
  Administered 2024-06-08: 2 g/h via INTRAVENOUS
  Filled 2024-06-08: qty 1000

## 2024-06-08 MED ORDER — VALACYCLOVIR HCL 500 MG PO TABS
500.0000 mg | ORAL_TABLET | Freq: Two times a day (BID) | ORAL | Status: DC
  Administered 2024-06-08 – 2024-06-24 (×32): 500 mg via ORAL
  Filled 2024-06-08 (×32): qty 1

## 2024-06-08 MED ORDER — CALCIUM CARBONATE ANTACID 500 MG PO CHEW
2.0000 | CHEWABLE_TABLET | ORAL | Status: DC | PRN
  Administered 2024-06-10 (×2): 400 mg via ORAL
  Filled 2024-06-08 (×2): qty 2

## 2024-06-08 MED ORDER — PRENATAL MULTIVITAMIN CH
1.0000 | ORAL_TABLET | Freq: Every day | ORAL | Status: DC
  Administered 2024-06-09 – 2024-06-23 (×15): 1 via ORAL
  Filled 2024-06-08 (×15): qty 1

## 2024-06-08 MED ORDER — LABETALOL HCL 100 MG PO TABS
100.0000 mg | ORAL_TABLET | Freq: Two times a day (BID) | ORAL | Status: DC
  Administered 2024-06-08 – 2024-06-11 (×6): 100 mg via ORAL
  Filled 2024-06-08 (×6): qty 1

## 2024-06-08 MED ORDER — ACETAMINOPHEN 325 MG PO TABS
650.0000 mg | ORAL_TABLET | ORAL | Status: DC | PRN
  Administered 2024-06-09 – 2024-06-23 (×12): 650 mg via ORAL
  Filled 2024-06-08 (×12): qty 2

## 2024-06-08 MED ORDER — LACTATED RINGERS IV SOLN: INTRAVENOUS | Status: AC

## 2024-06-08 NOTE — Progress Notes (Signed)
 HIGH-RISK PREGNANCY VISIT Patient name: Taylor Jefferson MRN [id number]  Date of birth: [birth year] Chief Complaint:   Routine Prenatal Visit  History of Present Illness:   Taylor Jefferson is a 23 y.o. G29P0010 female at [redacted]w[redacted]d with an Estimated Date of Delivery: 08/22/24 being seen today for ongoing management of a high-risk pregnancy complicated by:  -severe FGR -CMV -Fetal cardiac deviation -Chlamydia, TOC negative -HSV  Today she reports no complaints.   Contractions: Not present. Vag. Bleeding: None.  Movement: Present. denies leaking of fluid.      02/09/2024    9:06 AM 05/16/2021    4:53 AM 12/05/2020    1:19 PM 01/31/2020   11:34 AM  Depression screen PHQ 2/9  Decreased Interest 1 2  0  Down, Depressed, Hopeless 0 2  0  PHQ - 2 Score 1 4  0  Altered sleeping 0 1  3  Tired, decreased energy 1 2  2   Change in appetite 1 0  2  Feeling bad or failure about yourself  0 2  0  Trouble concentrating 0 1  0  Moving slowly or fidgety/restless 0 0  1  Suicidal thoughts 0 1    PHQ-9 Score 3 11  8   Difficult doing work/chores  Very difficult       Information is confidential and restricted. Go to Review Flowsheets to unlock data.     Current Outpatient Medications  Medication Instructions   acetaminophen  (TYLENOL ) 500 mg, Every 6 hours PRN   albuterol  (VENTOLIN  HFA) 108 (90 Base) MCG/ACT inhaler 1 puff, Every 4 hours PRN   aspirin  EC 162 mg, Oral, Daily, Swallow whole.   cyclobenzaprine  (FLEXERIL ) 10 mg, Oral, 3 times daily PRN   labetalol  (NORMODYNE ) 100 mg, Oral, 2 times daily   metroNIDAZOLE  (FLAGYL ) 500 mg, Oral, 2 times daily   miconazole  (MICONAZOLE  7) 2 % vaginal cream 1 Applicatorful, Vaginal, Daily at bedtime, X 7 nights   ondansetron  (ZOFRAN -ODT) 4 mg, Oral, Every 8 hours PRN   prenatal vitamin w/FE, FA (PRENATAL 1 + 1) 27-1 MG TABS tablet 1 tablet, Oral, Daily   valACYclovir  (VALTREX ) 500 mg, Oral, 2 times daily     Review of Systems:   Pertinent items are  noted in HPI Denies abnormal vaginal discharge w/ itching/odor/irritation, headaches, visual changes, shortness of breath, chest pain, abdominal pain, severe nausea/vomiting, or problems with urination or bowel movements unless otherwise stated above. Pertinent History Reviewed:  Reviewed past medical,surgical, social, obstetrical and family history.  Reviewed problem list, medications and allergies. Physical Assessment:   Vitals:   06/08/24 1507  BP: 121/84  Pulse: 80  Weight: 127 lb 9.6 oz (57.9 kg)  Body mass index is 22.6 kg/m.           Physical Examination:   General appearance: alert, well appearing, and in no distress  Mental status: normal mood, behavior, speech, dress, motor activity, and thought processes  Skin: warm & dry   Extremities:      Cardiovascular: normal heart rate noted  Respiratory: normal respiratory effort, no distress  Abdomen: gravid, soft, non-tender  Pelvic: Cervical exam deferred         Fetal Status:     Movement: Present    Fetal Surveillance Testing today: ,frank breech,posterior fundal placenta gr 0,absent EDF,FHR 143 bpm,AFI 10 cm,BPP 8/8,abnormal cardiac axis,EFW 541 g    Chaperone: N/A    No results found for this or any previous visit (from the past 24 hours).  Assessment & Plan:  High-risk pregnancy: G2P0010 at [redacted]w[redacted]d with an Estimated Date of Delivery: 08/22/24   1) Severe FGR - Today's ultrasound very concerning due to AEDF and poor fetal growth -recommendation for hospital admission -MFM consulted -plan for steroids and move towards delivery pending FHT and follow up imaging  Meds: No orders of the defined types were placed in this encounter.   Labs/procedures today: doppler and growth as above  Treatment Plan:  admit to [facility]    Follow-up: Return for admit to [facility].   Future Appointments  Date Time Provider Department Center  06/15/2024  2:15 PM [facility] - FTOBGYN US  [facility] None  06/15/2024  3:10 PM Jayne Vonn DEL, MD  [facility] FTOBGYN  06/15/2024  4:30 PM Bret, Harlene R, LCSW [facility] None  06/22/2024  2:15 PM [facility] - [facility]YN US  [facility] None  06/22/2024  3:10 PM Jayne Vonn DEL, MD [facility]TO[facility]YN  06/28/2024  3:00 PM [facility] - F[facility]YN US  [facility] None  06/28/2024  3:50 PM Chen Holzman, DO [facility]YN  06/29/2024  8:00 AM Bret, Harlene R, LCSW [facility] None  07/01/2024  3:30 PM [facility] NURSE [facility]  07/05/2024  3:00 PM [facility] - F[facility]YN US  [facility] None  07/05/2024  3:50 PM Marilynn Nest, DO [facility]YN  07/08/2024  3:30 PM [facility] NURSE [facility]  07/12/2024  1:30 PM [facility] - F[facility]YN US  [facility] None  07/12/2024  2:30 PM Jayne Vonn DEL, MD [facility]YN  07/13/2024  8:00 AM Bret, [name], LCSW [facility] None  07/15/2024  3:30 PM [facility] NURSE [facility]  07/19/2024  3:30 PM [facility] NURSE [facility]  07/19/2024  3:50 PM Kizzie Suzen SAUNDERS, CNM [facility]  07/22/2024  3:30 PM [facility] N[facility]SE [facility]  07/26/2024  3:00 PM [facility] - [facility]YN US  [facility] None  07/26/2024  3:50 PM Marilynn Nest, DO [facility]YN  07/29/2024  3:30 PM [facility] NURSE [facility]  08/02/2024  3:00 PM [facility] - F[facility] US  [facility] None  08/02/2024  3:50 PM Kizzie Suzen SAUNDERS, CNM [facility]  08/05/2024 10:30 AM [facility] N[facility]SE [facility]  08/09/2024  3:00 PM [facility] - [facility] US  [facility] None  08/09/2024  3:50 PM Jayne Vonn DEL, MD [facility] F[facility]YN  08/12/2024  3:30 PM [facility] NURSE [facility]  08/16/2024  3:00 PM [facility] - [facility] US  [facility] None  08/16/2024  3:50 PM Marilynn Nest, DO [facility]  08/19/2024  3:30 PM [facility] NURSE [facility]    No orders of the defined types were placed in this encounter.   Seiji Wiswell, DO Attending Obstetrician & Gynecologist, [facility] for [employer], [facility]

## 2024-06-08 NOTE — Plan of Care (Signed)
 Maternal-fetal medicine plan of care Taylor Jefferson is a 23 y.o. G2P0010 at [redacted]w[redacted]d admitted for severe early onset fetal growth restriction with absent end-diastolic blood flow.  The interval growth between her previous ultrasound was about 100 g.  The current estimated fetal weight is 541 g.  I discussed this patient with Dr. Ozan, and recommend the patient be admitted and placed on continuous monitoring, given betamethasone , magnesium  sulfate for fetal neuroprotection and have an ultrasound repeated for tomorrow.  I have communicated with the ultrasound tech that we would like an umbilical artery Dopplers and ductus venosus Dopplers.  Delivery is likely to be in 48 hours after the first shot of her steroids given the very poor fetal growth in the setting of abnormal Doppler flow.  The immediate management of the fetus will be based on the fetal tracing.  Indications for delivery would include prolonged periods of minimal to absent variability or recurrent clinically significant decelerations (greater than 3 an hour).  Plan -MFM to see the patient tomorrow-Dr. Kizzie will be on-call at this time -Continuous fetal monitoring -Betamethasone  course -Magnesium  sulfate for fetal neuroprotection -NICU consultation -Umbilical artery Dopplers and ductus venosus Dopplers tomorrow -Delivery likely a cesarean delivery in 48 hours or sooner if indicated -OB provider to discuss this would likely be a classical delivery

## 2024-06-08 NOTE — Progress Notes (Signed)
 US  29+2 wks,frank breech,posterior fundal placenta gr 0,absent EDF,FHR 143 bpm,AFI 10 cm,BPP 8/8,abnormal cardiac axis,EFW 541 g

## 2024-06-08 NOTE — Consult Note (Signed)
 Neonatology Prenatal Consult       06/08/2024  7:01 PM   I was asked by Dr. Izell to consult on this patient for impending preterm delivery.  I had the pleasure of meeting with [name] today.  She is a 23 yo G2P0010 at 29+2 who is being admitted for severe early onset fetal growth restriction with absent EDF and minimal interval growth.  Plan for betamethasone  and neuroprotective magnesium  with likely delivery after steroid completion in 48 hours.  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  Also tested for TORCH infections during pregnancy due to severe IUGR.  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  Due to screening in nature (no symptoms or known exposures) and no prior testing, it is unclear if this is a primary infection early during this pregnancy or a secondary infection.  I explained that the neonatal intensive care team would be present for the delivery and outlined the likely delivery room course for this baby including routine resuscitation and NRP-guided approaches to the treatment of respiratory distress. We discussed common problems associated with prematurity including respiratory distress syndrome/CLD, apnea, feeding issues, temperature regulation, and infection risk.  We briefly discussed IVH/PVL, ROP, and NEC and that these are complications associated with prematurity. We discussed that complications are even more likely given his small size. We also discussed that if she had a primary CMV infection there may be other complications such as liver dysfunction, hearing loss, and abnormal brain function for her son.      We discussed the average length of stay but I noted that the actual LOS would depend on the severity of problems encountered and response to treatments.  We discussed visitation policies and the resources available while her child is in the  hospital.  Infant's father is not involved.  We discussed the importance of good nutrition and various methods of providing nutrition (parenteral hyperalimentation, gavage feedings and/or oral feeding). We discussed the benefits of human milk. I encouraged breast feeding and pumping soon after birth and outlined resources that are available to support breast feeding.    Thank you for involving us  in the care of this patient. A member of our team will be available should the family have additional questions.  Time for consultation approximately 20 minutes.   _____________________ Electronically Signed By: Schuyler Carpen, MD, MS Neonatologist

## 2024-06-08 NOTE — H&P (Signed)
 Obstetrics Admission History & Physical  06/08/2024 - 4:27 PM Primary OBGYN: [facility]  Chief Complaint: scant interval fetal growth  History of Present Illness  23 y.o. G2P0010 at [redacted]w[redacted]d (Dating: by 1st trimester u/s), with the above CC. 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 CHTN (dx on 8/6). 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  Patient had follow up growth with FT and given scant interval growth and in consultation with MFM, Dr. Ozan sent patient down for direct admit  +FM, no labor s/s.   Review of Systems: as noted in the History of Present Illness.  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: although IgM for CMV may remain elevated for months to years 05/25/2024   Marginal insertion of umbilical cord affecting management of mother in second trimester 03/29/2024   Fetal growth restriction antepartum 03/29/2024   Supervision of high risk pregnancy, antepartum 02/09/2024   Herpes 05/30/2022   Marijuana use 05/17/2021   Recurrent major depressive disorder (HCC) 05/17/2021   Unspecified mood (affective) disorder (HCC) 05/17/2021   Anxiety disorder, unspecified 05/17/2021   Intentional overdose of drug in tablet form (HCC) 05/16/2021   Mild persistent asthma without complication 02/29/2020   Chlamydia 09/27/2019   Flat feet, bilateral 10/09/2018   MDD (major depressive disorder), recurrent severe, 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   Migraine without aura and without status migrainosus, not intractable 12/24/2016   Anxiety state 01/04/2015    PMHx:  Past Medical History:  Diagnosis Date   Acute appendicitis with localized peritonitis, without perforation, abscess, or gangrene 10/09/2023   ADD (attention deficit disorder)    Allergy    Anxiety    Asthma    Chlamydia 09/27/2019    tx 12/7, POC________   Contraceptive management 04/11/2014   Depression    HA (headache)    History of chlamydia 10/25/2019   Patellar pain    Dr. 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   Precocious puberty 10/08/2011   Reflux    Seasonal allergies    Suicidal thoughts 01/06/2018   PSHx:  Past Surgical History:  Procedure Laterality Date   XI ROBOTIC LAPAROSCOPIC ASSISTED APPENDECTOMY N/A 10/09/2023   Procedure: XI ROBOTIC LAPAROSCOPIC ASSISTED APPENDECTOMY;  Surgeon: Mavis Anes, MD;  Location: [facility];  Service: General;  Laterality: N/A;   Medications:  Medications Prior to Admission  Medication Sig Dispense Refill Last Dose/Taking   acetaminophen  (TYLENOL ) 500 MG tablet Take 500 mg by mouth every 6 (six) hours as needed for moderate pain (pain score 4-6).      albuterol  (VENTOLIN  HFA) 108 (90 Base) MCG/ACT inhaler Inhale 1 puff into the lungs every 4 (four) hours as needed.      aspirin  EC 81 MG tablet Take 2 tablets (162 mg total) by mouth daily. Swallow whole. 180 tablet 2    cyclobenzaprine  (FLEXERIL ) 5 MG tablet Take 2 tablets (10 mg total) by mouth 3 (three) times daily as needed for muscle spasms. 30 tablet 2    labetalol  (NORMODYNE ) 100 MG tablet Take 1 tablet (100 mg total) by mouth 2 (two) times daily. 60 tablet 0    metroNIDAZOLE  (FLAGYL ) 500 MG tablet Take 1 tablet (500 mg total) by mouth 2 (two) times daily. (Patient not taking: Reported on 06/08/2024) 14 tablet 0    miconazole  (MICONAZOLE  7)  2 % vaginal cream Place 1 Applicatorful vaginally at bedtime. X 7 nights 45 g 0    ondansetron  (ZOFRAN -ODT) 4 MG disintegrating tablet Take 1 tablet (4 mg total) by mouth every 8 (eight) hours as needed for nausea or vomiting. 20 tablet 3    prenatal vitamin w/FE, FA (PRENATAL 1 + 1) 27-1 MG TABS tablet Take 1 tablet by mouth daily at 12 noon. 30 tablet 12    valACYclovir  (VALTREX ) 1000 MG tablet Take 0.5 tablets (500 mg total) by mouth 2 (two) times daily.  30 tablet 12      Allergies: is allergic to other. OBHx:  OB History  Gravida Para Term Preterm AB Living  2    1   SAB IAB Ectopic Multiple Live Births  1        # Outcome Date GA Lbr Len/2nd Weight Sex Type Anes PTL Lv  2 Current           1 SAB                   FHx:  Family History  Problem Relation Age of Onset   Depression Mother    HIV Mother    Other Mother        spinal stenosis   Glaucoma Mother    Migraines Mother    Heart disease Father    Hypertension Maternal Grandmother    Arthritis Maternal Grandmother        rheumatoid   Other Maternal Grandmother        acid reflux   Migraines Maternal Grandmother    Depression Maternal Grandmother    Hyperlipidemia Maternal Grandfather    ADD / ADHD Sister    ADD / ADHD Brother    Soc Hx:  Social History   Socioeconomic History   Marital status: Single    Spouse name: Not on file   Number of children: Not on file   Years of education: Not on file   Highest education level: Not on file  Occupational History   Not on file  Tobacco Use   Smoking status: Former    Types: Cigarettes    Passive exposure: Current   Smokeless tobacco: Never  Vaping Use   Vaping status: Former   Substances: Nicotine  Substance and Sexual Activity   Alcohol use: Not Currently    Comment: occassionally   Drug use: Not Currently    Types: Marijuana    Comment: daily   Sexual activity: Not Currently  Other Topics Concern   Not on file  Social History Narrative   Taylor Jefferson is an 11th grade student at [employer].  She performs well in school.    Lives with mother and maternal grandmother. She has paternal half siblings that do not live in the home.   Social Drivers of Corporate investment banker Strain: Low Risk  (02/09/2024)   Overall Financial Resource Strain (CARDIA)    Difficulty of Paying Living Expenses: Not hard at all  Food Insecurity: No Food Insecurity (02/09/2024)   Hunger Vital Sign    Worried About  Running Out of Food in the Last Year: Never true    Ran Out of Food in the Last Year: Never true  Transportation Needs: No Transportation Needs (02/09/2024)   PRAPARE - Administrator, Civil Service (Medical): No    Lack of Transportation (Non-Medical): No  Physical Activity: Inactive (02/09/2024)   Exercise Vital Sign    Days of  Exercise per Week: 0 days    Minutes of Exercise per Session: 0 min  Stress: No Stress Concern Present (02/09/2024)   [employer] of Occupational Health - Occupational Stress Questionnaire    Feeling of Stress : Only a little  Social Connections: Moderately Isolated (02/09/2024)   Social Connection and Isolation Panel    Frequency of Communication with Friends and Family: More than three times a week    Frequency of Social Gatherings with Friends and Family: More than three times a week    Attends Religious Services: More than 4 times per year    Active Member of [employer] or Organizations: No    Attends Banker Meetings: Never    Marital Status: Never married  Intimate Partner Violence: Not At Risk (02/09/2024)   Humiliation, Afraid, Rape, and Kick questionnaire    Fear of Current or Ex-Partner: No    Emotionally Abused: No    Physically Abused: No    Sexually Abused: No    Objective  Patient Vitals for the past 12 hrs:  BP Temp Temp src Pulse Resp SpO2 Height Weight  06/08/24 1642 (!) 133/91 97.6 F (36.4 C) Oral 68 17 100 % 5' 3 (1.6 m) 57.9 kg   EFM: 140 baseline, ?accel, no decel, mod variability Toco: quiet  General: Well nourished, well developed female in no acute distress.  Skin:  Warm and dry.  Respiratory:  Normal respiratory effort Abdomen: gravid, nttp Neuro/Psych:  Normal mood and affect.   Labs pending  Radiology 8/19: 541g, afi 10, bpp 8/8, AEDF, frank breech 8/14: fetal echo wnl, elevated UADs 7/29: 442g, AEDF  Perinatal info  ABO, Rh: B/Positive/-- (04/21 1117) Antibody: Negative (08/05  0841) Rubella: 8.50 (07/15 1346) RPR: Non Reactive (08/05 0841)  HBsAg: Negative (05/19 1623)  HIV: Non Reactive (08/05 0841)  2 hr Glucola  negative Genetic screening: panorama low risk  Anatomy US  normal except FGR  CMV and IgG and IgM positive  Assessment & Plan   23 y.o. G2P0010 at 104w2d stable with chronic FGR D/w Dr. William and they will come to see in the morning after repeat UA dopplers and possible ductus studies. Plan for tonight is continuous EFM, BMZ, Mg x 12h (start now). NICU aware and will come see. I d/w her re: need for c/s at time for delivery and pt is amenable to this; consent signed.   H/o HSV: no s/s. Valtrex  bid prophylaxis start  Syphilis exposure: July 2025 and [facility] plan is for PCN x1 (2.46m units x 1 given on 7/28) and RPR in 3/6/9 months Plan: PCN, RPR now and at 3/6/9 months 05/25/24: RPR negative  CHTN: hold off on labetalol  100 bid dose given may be beneficial to keep in mild range for now. F/u pre-eclampsia labs  Bebe Izell Raddle MD Attending [facility]) GYN Consult Phone: [phone] (M-F, [phone]) & [phone] (Off hours, weekends, holidays)

## 2024-06-09 ENCOUNTER — Inpatient Hospital Stay (HOSPITAL_BASED_OUTPATIENT_CLINIC_OR_DEPARTMENT_OTHER)

## 2024-06-09 DIAGNOSIS — Z369 Encounter for antenatal screening, unspecified: Secondary | ICD-10-CM

## 2024-06-09 DIAGNOSIS — O365930 Maternal care for other known or suspected poor fetal growth, third trimester, not applicable or unspecified: Secondary | ICD-10-CM

## 2024-06-09 DIAGNOSIS — Z3A29 29 weeks gestation of pregnancy: Secondary | ICD-10-CM | POA: Diagnosis not present

## 2024-06-09 DIAGNOSIS — O43193 Other malformation of placenta, third trimester: Secondary | ICD-10-CM | POA: Diagnosis not present

## 2024-06-09 DIAGNOSIS — O98113 Syphilis complicating pregnancy, third trimester: Secondary | ICD-10-CM

## 2024-06-09 DIAGNOSIS — O288 Other abnormal findings on antenatal screening of mother: Secondary | ICD-10-CM

## 2024-06-09 LAB — RPR: RPR Ser Ql: NONREACTIVE

## 2024-06-09 MED ORDER — METRONIDAZOLE 500 MG PO TABS
500.0000 mg | ORAL_TABLET | Freq: Two times a day (BID) | ORAL | Status: AC
  Administered 2024-06-09 – 2024-06-15 (×14): 500 mg via ORAL
  Filled 2024-06-09 (×14): qty 1

## 2024-06-09 MED ORDER — OXYCODONE HCL 5 MG PO TABS
5.0000 mg | ORAL_TABLET | Freq: Once | ORAL | Status: AC
  Administered 2024-06-09: 5 mg via ORAL
  Filled 2024-06-09: qty 1

## 2024-06-09 MED ORDER — ASPIRIN 81 MG PO TBEC
81.0000 mg | DELAYED_RELEASE_TABLET | Freq: Every day | ORAL | Status: DC
  Administered 2024-06-10 – 2024-06-13 (×4): 81 mg via ORAL
  Filled 2024-06-09 (×4): qty 1

## 2024-06-09 MED ORDER — ASPIRIN 81 MG PO TBEC: 81.0000 mg | DELAYED_RELEASE_TABLET | Freq: Every day | ORAL | Status: DC

## 2024-06-09 NOTE — Progress Notes (Signed)
 FACULTY PRACTICE ANTEPARTUM COMPREHENSIVE PROGRESS NOTE  Taylor Jefferson is a 23 y.o. G2P0010 at [redacted]w[redacted]d who is admitted for severe early onset fetal growth restriction with absent end-diastolic blood flow.   Estimated Date of Delivery: 08/22/24 Fetal presentation is breech.  Length of Stay:  1 Days. Admitted 06/08/2024  Subjective: No complaints this morning. Patient reports good fetal movement.  She reports no uterine contractions, no bleeding and no loss of fluid per vagina.  Vitals:  Blood pressure 131/84, pulse 86, temperature 97.9 F (36.6 C), temperature source Oral, resp. rate 16, height 5' 3 (1.6 m), weight 57.9 kg, last menstrual period 11/16/2023, SpO2 100%. Physical Examination: CONSTITUTIONAL: Well-developed, well-nourished female in no acute distress.  NEUROLOGIC: Alert and oriented to person, place, and time. No cranial nerve deficit noted. PSYCHIATRIC: Normal mood and affect. Normal behavior. Normal judgment and thought content. CARDIOVASCULAR: Normal heart rate noted, regular rhythm RESPIRATORY: Effort and breath sounds normal, no problems with respiration noted MUSCULOSKELETAL: Normal range of motion. No edema and no tenderness. 2+ distal pulses. ABDOMEN: Soft, nontender, nondistended, gravid. CERVIX:  Deferred  Fetal monitoring: FHR: 115 bpm, Variability: moderate, Accelerations: 10 x 10 Present, Decelerations: Absent  Uterine activity: No contractions  Results for orders placed or performed during the hospital encounter of 06/08/24 (from the past 48 hours)  Type and screen [facility]     Status: None   Collection Time: 06/08/24  5:04 PM  Result Value Ref Range   ABO/RH(D) B POS    Antibody Screen NEG    Sample Expiration      06/11/2024,2359 Performed at [facility], [address], [place], [place] [address]   CBC     Status: None   Collection Time: 06/08/24  5:05 PM  Result Value Ref Range   WBC 6.0 4.0 - 10.5 K/uL   RBC 4.48 3.87 -  5.11 MIL/uL   Hemoglobin 13.5 12.0 - 15.0 g/dL   HCT 60.5 63.9 - 53.9 %   MCV 87.9 80.0 - 100.0 fL   MCH 30.1 26.0 - 34.0 pg   MCHC 34.3 30.0 - 36.0 g/dL   RDW 87.7 88.4 - 84.4 %   Platelets 173 150 - 400 K/uL   nRBC 0.0 0.0 - 0.2 %    Comment: Performed at [facility], [address] [place], [place] [address]  Comprehensive metabolic panel     Status: Abnormal   Collection Time: 06/08/24  5:05 PM  Result Value Ref Range   Sodium 134 (L) 135 - 145 mmol/L   Potassium 3.8 3.5 - 5.1 mmol/L   Chloride 105 98 - 111 mmol/L   CO2 20 (L) 22 - 32 mmol/L   Glucose, Bld 71 70 - 99 mg/dL    Comment: Glucose reference range applies only to samples taken after fasting for at least 8 hours.   BUN 8 6 - 20 mg/dL   Creatinine, Ser 9.40 0.44 - 1.00 mg/dL   Calcium  8.9 8.9 - 10.3 mg/dL   Total Protein 7.4 6.5 - 8.1 g/dL   Albumin 3.2 (L) 3.5 - 5.0 g/dL   AST 19 15 - 41 U/L   ALT 19 0 - 44 U/L   Alkaline Phosphatase 136 (H) 38 - 126 U/L   Total Bilirubin 0.6 0.0 - 1.2 mg/dL   GFR, Estimated >39 >39 mL/min    Comment: (NOTE) Calculated using the CKD-EPI Creatinine Equation (2021)    Anion gap 9 5 - 15  Comment: Performed at [facility], [address] [place], [place] [address]  RPR     Status: None   Collection Time: 06/08/24  5:26 PM  Result Value Ref Range   RPR Ser Ql NON REACTIVE NON REACTIVE    Comment: Performed at [facility], [address], [place], [place] [address]  Protein / creatinine ratio, urine     Status: None   Collection Time: 06/08/24  7:38 PM  Result Value Ref Range   Creatinine, Urine 34 mg/dL   Total Protein, Urine <6 mg/dL    Comment: NO NORMAL RANGE ESTABLISHED FOR THIS TEST   Protein Creatinine Ratio        0.00 - 0.15 mg/mg[Cre]    Comment: RESULT OUTSIDE REPORTABLE RANGE, UNABLE TO CALCULATE. Performed at [facility], [address], [place], [place] [address]     US  OB Follow Up Result Date: 06/08/2024 Table formatting  from the original result was not included. Images from the original result were not included.  ..an [employer]tras[employer] Medicine Technical sales engineer) accredited practice Center for [facility] [address] [place] [address] Ordering Provider: Ozan, Jennifer, DO FOLLOW UP SONOGRAM Taylor Jefferson is in the office for a follow up sonogram for EFW,BPP and cord dopplers. She is a 23 y.o. year old G2P0010 with Estimated Date of Delivery: 08/22/24 by LMP now at  [redacted]w[redacted]d weeks gestation. Thus far the pregnancy has been complicated by [facility]. GESTATION: SINGLETON PRESENTATION: breech frank FETAL ACTIVITY:          Heart rate         143          The fetus is active. AMNIOTIC FLUID: The amniotic fluid volume is  normal, 10 cm. PLACENTA LOCALIZATION:  fundal, posterior  small thick placenta GRADE 0 CERVIX: Limited view GESTATIONAL AGE AND  BIOMETRICS: Gestational criteria: Estimated Date of Delivery: 08/22/24 by LMP now at [redacted]w[redacted]d Previous Scans:10          BIPARIETAL DIAMETER           6.08 cm         24+5 weeks HEAD CIRCUMFERENCE           23 cm         25 weeks ABDOMINAL CIRCUMFERENCE           17.56 cm         22+3 weeks FEMUR LENGTH           3.92 cm         22+4 weeks                                                       AVERAGE EGA(BY THIS SCAN):  24+4 weeks                                                 ESTIMATED FETAL WEIGHT:       541  grams BIOPHYSICAL PROFILE:  COMMENTS GROSS BODY MOVEMENT                 2  TONE                2  RESPIRATIONS                2  AMNIOTIC FLUID                2                                                          SCORE:  8/8 (Note: NST was not performed as part of this antepartum testing)  DOPPLER FLOW STUDIES: UMBILICAL ARTERY RI RATIOS:   absent EDF ANATOMICAL SURVEY                                                                            COMMENTS  CEREBRAL VENTRICLES yes normal  CHOROID PLEXUS yes normal  CEREBELLUM yes normal  CISTERNA MAGNA  Yes  normal   CAVUM SEPTI PELLUCIDI YES NORMAL  NUCHAL REGION yes normal              FACIAL PROFILE yes normal  4 CHAMBERED HEART yes abnormal Abnormal cardiac axis  OUTFLOW TRACTS YES normaL  3VV YES NORMAL  3VTV YES NORMAL  SITUS YES NORMAL      DIAPHRAGM yes normal  STOMACH yes normal  RENAL REGION yes normal  BLADDER yes normal  PLACENTA CORD INSERTION Yes   abormal  Marginal cord insertion ABDOMINAL CORD INSERTION YES NORMAL  3 VESSEL CORD yes normal              GENITALIA   female     SUSPECTED ABNORMALITIES:  yes QUALITY OF SCAN: satisfactory TECHNICIAN COMMENTS: US  29+2 wks,frank breech,small thick posterior fundal placenta  gr 0,absent EDF,FHR 143 bpm,AFI 10 cm,BPP 8/8,abnormal cardiac axis,EFW 541 g,marginal cord insertion A copy of this report including all images has been saved and backed up to a second source for retrieval if needed. All measures and details of the anatomical scan, placentation, fluid volume and pelvic anatomy are contained in that report. Amber JINNY Pitts 06/08/2024 3:24 PM Clinical Impression and recommendations: I have reviewed the sonogram results above, combined with the patient's current clinical course, below are my impressions and any appropriate recommendations for management based on the sonographic findings. 1.  G2P0010 Estimated Date of Delivery: 08/22/24 by serial sonographic evaluations 2.  Fetal sonographic surveillance findings: a). Normal fluid volume b). Normal antepartum fetal assessment with BPP 8/8 c). Abnormal dopplers with absent end diastolic flow d). Severe fetal growth restriction with minimal weight gain.  Current weight: 541g Cardiac axis deviation and marginal cord insertion previously noted. 3.  Normal general sonographic findings Recommend hospital admission and coordination of care with Maternal-Fetal medicine. Delon CHRISTELLA Prude 06/08/2024 3:39 PM    US  UA Cord  Doppler Result Date: 06/08/2024 Table formatting from the original result was not included. Images from the original result were not  included.  ..an [employer]trasound Medicine Technical sales engineer) accredited practice Center for [facility] [address] [place] [address] Ordering Provider: Ozan, Jennifer, DO FOLLOW UP SONOGRAM Syrah T Turrubiates is in the office for a follow up sonogram for EFW,BPP and cord dopplers. She is a 23 y.o. year old G2P0010 with Estimated Date of Delivery: 08/22/24 by LMP now at  [redacted]w[redacted]d weeks gestation. Thus far the pregnancy has been complicated by [facility]. GESTATION: SINGLETON PRESENTATION: breech frank FETAL ACTIVITY:          Heart rate         143          The fetus is active. AMNIOTIC FLUID: The amniotic fluid volume is  normal, 10 cm. PLACENTA LOCALIZATION:  fundal, posterior  small thick placenta GRADE 0 CERVIX: Limited view GESTATIONAL AGE AND  BIOMETRICS: Gestational criteria: Estimated Date of Delivery: 08/22/24 by LMP now at [redacted]w[redacted]d Previous Scans:10          BIPARIETAL DIAMETER           6.08 cm         24+5 weeks HEAD CIRCUMFERENCE           23 cm         25 weeks ABDOMINAL CIRCUMFERENCE           17.56 cm         22+3 weeks FEMUR LENGTH           3.92 cm         22+4 weeks                                                       AVERAGE EGA(BY THIS SCAN):  24+4 weeks                                                 ESTIMATED FETAL WEIGHT:       541  grams BIOPHYSICAL PROFILE:                                                                                                      COMMENTS GROSS BODY MOVEMENT                 2  TONE                2  RESPIRATIONS                2  AMNIOTIC FLUID                2  SCORE:  8/8 (Note: NST was not performed as part of this antepartum testing)  DOPPLER FLOW STUDIES: UMBILICAL ARTERY RI RATIOS:   absent EDF ANATOMICAL SURVEY                                                                             COMMENTS CEREBRAL VENTRICLES yes normal  CHOROID PLEXUS yes normal  CEREBELLUM yes normal  CISTERNA MAGNA  Yes  normal   CAVUM SEPTI PELLUCIDI YES NORMAL  NUCHAL REGION yes normal              FACIAL PROFILE yes normal  4 CHAMBERED HEART yes abnormal Abnormal cardiac axis  OUTFLOW TRACTS YES normaL  3VV YES NORMAL  3VTV YES NORMAL  SITUS YES NORMAL      DIAPHRAGM yes normal  STOMACH yes normal  RENAL REGION yes normal  BLADDER yes normal  PLACENTA CORD INSERTION Yes   abormal  Marginal cord insertion ABDOMINAL CORD INSERTION YES NORMAL  3 VESSEL CORD yes normal              GENITALIA   female     SUSPECTED ABNORMALITIES:  yes QUALITY OF SCAN: satisfactory TECHNICIAN COMMENTS: US  29+2 wks,frank breech,small thick posterior fundal placenta  gr 0,absent EDF,FHR 143 bpm,AFI 10 cm,BPP 8/8,abnormal cardiac axis,EFW 541 g,marginal cord insertion A copy of this report including all images has been saved and backed up to a second source for retrieval if needed. All measures and details of the anatomical scan, placentation, fluid volume and pelvic anatomy are contained in that report. Amber JINNY Pitts 06/08/2024 3:24 PM Clinical Impression and recommendations: I have reviewed the sonogram results above, combined with the patient's current clinical course, below are my impressions and any appropriate recommendations for management based on the sonographic findings. 1.  G2P0010 Estimated Date of Delivery: 08/22/24 by serial sonographic evaluations 2.  Fetal sonographic surveillance findings: a). Normal fluid volume b). Normal antepartum fetal assessment with BPP 8/8 c). Abnormal dopplers with absent end diastolic flow d). Severe fetal growth restriction with minimal weight gain.  Current weight: 541g Cardiac axis deviation and marginal cord insertion previously noted. 3.  Normal general sonographic findings Recommend hospital admission and coordination of care with Maternal-Fetal medicine.  Taylor M Jefferson 06/08/2024 3:39 PM    US  Fetal BPP W/O Non Stress Result Date: 06/08/2024 Table formatting from the original result was not included. Images from the original result were not included.  ..an [employer]tras[employer] Medicine Technical sales engineer) accredited practice Center for [facility] [address] [place] [address] Ordering Provider: Ozan, Jennifer, DO FOLLOW UP SONOGRAM Donia T Renken is in the office for a follow up sonogram for EFW,BPP and cord dopplers. She is a 23 y.o. year old G2P0010 with Estimated Date of Delivery: 08/22/24 by LMP now at  [redacted]w[redacted]d weeks gestation. Thus far the pregnancy has been complicated by [facility]. GESTATION: SINGLETON PRESENTATION: breech frank FETAL ACTIVITY:          Heart rate         143          The fetus is active. AMNIOTIC FLUID: The amniotic fluid volume is  normal, 10  cm. PLACENTA LOCALIZATION:  fundal, posterior  small thick placenta GRADE 0 CERVIX: Limited view GESTATIONAL AGE AND  BIOMETRICS: Gestational criteria: Estimated Date of Delivery: 08/22/24 by LMP now at [redacted]w[redacted]d Previous Scans:10          BIPARIETAL DIAMETER           6.08 cm         24+5 weeks HEAD CIRCUMFERENCE           23 cm         25 weeks ABDOMINAL CIRCUMFERENCE           17.56 cm         22+3 weeks FEMUR LENGTH           3.92 cm         22+4 weeks                                                       AVERAGE EGA(BY THIS SCAN):  24+4 weeks                                                 ESTIMATED FETAL WEIGHT:       541  grams BIOPHYSICAL PROFILE:                                                                                                      COMMENTS GROSS BODY MOVEMENT                 2  TONE                2  RESPIRATIONS                2  AMNIOTIC FLUID                2                                                          SCORE:  8/8 (Note: NST was not performed as part of this antepartum testing)  DOPPLER FLOW STUDIES: UMBILICAL ARTERY RI RATIOS:    absent EDF ANATOMICAL SURVEY  COMMENTS CEREBRAL VENTRICLES yes normal  CHOROID PLEXUS yes normal  CEREBELLUM yes normal  CISTERNA MAGNA  Yes  normal   CAVUM SEPTI PELLUCIDI YES NORMAL  NUCHAL REGION yes normal              FACIAL PROFILE yes normal  4 CHAMBERED HEART yes abnormal Abnormal cardiac axis  OUTFLOW TRACTS YES normaL  3VV YES NORMAL  3VTV YES NORMAL  SITUS YES NORMAL      DIAPHRAGM yes normal  STOMACH yes normal  RENAL REGION yes normal  BLADDER yes normal  PLACENTA CORD INSERTION Yes   abormal  Marginal cord insertion ABDOMINAL CORD INSERTION YES NORMAL  3 VESSEL CORD yes normal              GENITALIA   female     SUSPECTED ABNORMALITIES:  yes QUALITY OF SCAN: satisfactory TECHNICIAN COMMENTS: US  29+2 wks,frank breech,small thick posterior fundal placenta  gr 0,absent EDF,FHR 143 bpm,AFI 10 cm,BPP 8/8,abnormal cardiac axis,EFW 541 g,marginal cord insertion A copy of this report including all images has been saved and backed up to a second source for retrieval if needed. All measures and details of the anatomical scan, placentation, fluid volume and pelvic anatomy are contained in that report. Amber JINNY Pitts 06/08/2024 3:24 PM Clinical Impression and recommendations: I have reviewed the sonogram results above, combined with the patient's current clinical course, below are my impressions and any appropriate recommendations for management based on the sonographic findings. 1.  G2P0010 Estimated Date of Delivery: 08/22/24 by serial sonographic evaluations 2.  Fetal sonographic surveillance findings: a). Normal fluid volume b). Normal antepartum fetal assessment with BPP 8/8 c). Abnormal dopplers with absent end diastolic flow d). Severe fetal growth restriction with minimal weight gain.  Current weight: 541g Cardiac axis deviation and marginal cord insertion previously noted. 3.  Normal general sonographic findings Recommend hospital  admission and coordination of care with Maternal-Fetal medicine. Taylor M Jefferson 06/08/2024 3:39 PM     Current scheduled medications  betamethasone  acetate-betamethasone  sodium phosphate   12 mg Intramuscular Q24H   labetalol   100 mg Oral BID   metroNIDAZOLE   500 mg Oral BID   prenatal multivitamin  1 tablet Oral Q1200   valACYclovir   500 mg Oral BID    I have reviewed the patient's current medications.  ASSESSMENT: Principal Problem:   IUGR (intrauterine growth restriction) affecting care of mother  Absent end-diastolic flow  [redacted] weeks gestation  PLAN: Patient was seen by Dr. Kizzie earlier this morning. As per phone conversation, he feels her dopplers have not worsened and her tracing has been stable. She can now have NST TID and as needed.  She will continue to have daily UA dopplers. Patient is completing her Bethamethasone course, has been seen by NICU staff. She will have a cesarean delivery, likely classical, when indicated >> hoping this will be tomorrow of later if patient remains stable as per Dr. Kizzie. Continue routine antenatal care.   GLORIS HUGGER, MD, FACOG Obstetrician & Gynecologist, [facility] for [employer], [facility]

## 2024-06-09 NOTE — Consult Note (Signed)
 MFM Consultation  HPI: I had the pleasure of seeing your patient in consultation for severe fetal growth restriction with recently reported absent end-diastolic flow.  Prior to her consultation, on day of service, I reviewed the available records/labs provided to our office. During my consultation, recent updates to her prenatal records, labs, and imaging studies were reviewed.  Pregnancy Considerations: 1. Severe fetal growth restriction (<1st percentile) 2. Elevated umbilical artery dopplers to Absent end diastolic flow  3. Marginal cord insertion 4. Chronic hypertension 5. History of syphilis (treated) 6. Elevated CMV IgG, IgM 7. Anxiety and depression 8. Marijuana use (patient reports discontinued)  She had a LR NIPT and Horizon and AFP.  OB History  Gravida Para Term Preterm AB Living  2 0 0 0 1 0  SAB IAB Ectopic Multiple Live Births  1 0 0 0 0    # Outcome Date GA Lbr Len/2nd Weight Sex Type Anes PTL Lv  2 Current           1 SAB            Past Medical History:  Diagnosis Date   Acute appendicitis with localized peritonitis, without perforation, abscess, or gangrene 10/09/2023   ADD (attention deficit disorder)    Allergy    Anxiety    Asthma    Chlamydia 09/27/2019   tx 12/7, POC________   Contraceptive management 04/11/2014   Depression    HA (headache)    History of chlamydia 10/25/2019   Patellar pain    Dr. 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   Precocious puberty 10/08/2011   Reflux    Seasonal allergies    Suicidal thoughts 01/06/2018   Past Surgical History:  Procedure Laterality Date   XI ROBOTIC LAPAROSCOPIC ASSISTED APPENDECTOMY N/A 10/09/2023   Procedure: XI ROBOTIC LAPAROSCOPIC ASSISTED APPENDECTOMY;  Surgeon: Mavis Anes, MD;  Location: [facility];  Service: General;  Laterality: N/A;   Social History   Socioeconomic History   Marital status: Single    Spouse name: Not on file   Number of children: Not on file    Years of education: Not on file   Highest education level: Not on file  Occupational History   Not on file  Tobacco Use   Smoking status: Former    Types: Cigarettes    Passive exposure: Current   Smokeless tobacco: Never  Vaping Use   Vaping status: Former   Substances: Nicotine  Substance and Sexual Activity   Alcohol use: Not Currently    Comment: occassionally   Drug use: Not Currently    Types: Marijuana    Comment: daily   Sexual activity: Not Currently  Other Topics Concern   Not on file  Social History Narrative   Taylor Jefferson is an 11th grade student at [employer].  She performs well in school.    Lives with mother and maternal grandmother. She has paternal half siblings that do not live in the home.   Social Drivers of Corporate investment banker Strain: Low Risk  (02/09/2024)   Overall Financial Resource Strain (CARDIA)    Difficulty of Paying Living Expenses: Not hard at all  Food Insecurity: No Food Insecurity (06/08/2024)   Hunger Vital Sign    Worried About Running Out of Food in the Last Year: Never true    Ran Out of Food in the Last Year: Never true  Transportation Needs: No Transportation Needs (06/08/2024)   PRAPARE - Transportation  Lack of Transportation (Medical): No    Lack of Transportation (Non-Medical): No  Physical Activity: Inactive (02/09/2024)   Exercise Vital Sign    Days of Exercise per Week: 0 days    Minutes of Exercise per Session: 0 min  Stress: No Stress Concern Present (02/09/2024)   [employer] of Occupational Health - Occupational Stress Questionnaire    Feeling of Stress : Only a little  Social Connections: Moderately Isolated (02/09/2024)   Social Connection and Isolation Panel    Frequency of Communication with Friends and Family: More than three times a week    Frequency of Social Gatherings with Friends and Family: More than three times a week    Attends Religious Services: More than 4 times per year     Active Member of [employer] or Organizations: No    Attends Banker Meetings: Never    Marital Status: Never married  Intimate Partner Violence: Not At Risk (06/08/2024)   Humiliation, Afraid, Rape, and Kick questionnaire    Fear of Current or Ex-Partner: No    Emotionally Abused: No    Physically Abused: No    Sexually Abused: No   Family History  Problem Relation Age of Onset   Depression Mother    HIV Mother    Other Mother        spinal stenosis   Glaucoma Mother    Migraines Mother    Heart disease Father    Hypertension Maternal Grandmother    Arthritis Maternal Grandmother        rheumatoid   Other Maternal Grandmother        acid reflux   Migraines Maternal Grandmother    Depression Maternal Grandmother    Hyperlipidemia Maternal Grandfather    ADD / ADHD Sister    ADD / ADHD Brother     Current Facility-Administered Medications (Endocrine & Metabolic):    betamethasone  acetate-betamethasone  sodium phosphate  (CELESTONE ) injection 12 mg   Current Facility-Administered Medications (Cardiovascular):    labetalol  (NORMODYNE ) tablet 100 mg     Current Facility-Administered Medications (Analgesics):    acetaminophen  (TYLENOL ) tablet 650 mg     Current Facility-Administered Medications (Other):    calcium  carbonate (TUMS - dosed in mg elemental calcium ) chewable tablet 400 mg of elemental calcium    lactated ringers  infusion   lactated ringers  infusion   metroNIDAZOLE  (FLAGYL ) tablet 500 mg   prenatal multivitamin tablet 1 tablet   valACYclovir  (VALTREX ) tablet 500 mg  No current outpatient medications on file. Allergies  Allergen Reactions   Other Other (See Comments)    Seasonal - runny nose, itchy eyes       Latest Ref Rng & Units 06/08/2024    5:05 PM 05/26/2024    4:48 PM 05/25/2024    8:41 AM  CBC  WBC 4.0 - 10.5 K/uL 6.0  6.3  5.9   Hemoglobin 12.0 - 15.0 g/dL 86.4  85.3  85.8   Hematocrit 36.0 - 46.0 % 39.4  43.2  44.4   Platelets 150 -  400 K/uL 173  182  191       Latest Ref Rng & Units 06/08/2024    5:05 PM 05/26/2024    4:48 PM 02/09/2024   11:17 AM  CMP  Glucose 70 - 99 mg/dL 71  876  88   BUN 6 - 20 mg/dL 8  6  7    Creatinine 0.44 - 1.00 mg/dL 9.40  9.37  9.29   Sodium 135 - 145 mmol/L 134  137  138   Potassium 3.5 - 5.1 mmol/L 3.8  3.5  3.8   Chloride 98 - 111 mmol/L 105  104  103   CO2 22 - 32 mmol/L 20  23  20    Calcium  8.9 - 10.3 mg/dL 8.9  9.1  9.7   Total Protein 6.5 - 8.1 g/dL 7.4  8.0  7.6   Total Bilirubin 0.0 - 1.2 mg/dL 0.6  0.5  0.3   Alkaline Phos 38 - 126 U/L 136  134  88   AST 15 - 41 U/L 19  20  19    ALT 0 - 44 U/L 19  15  15       Review of Systems:  Denies any current complaints  Reports normal fetal movement  Ultrasound:  Single intrauterine pregnancy  Marginal cord insertion  AFI: 9.4  EFW: 582 grams (1 pound, 5 ounces), <1st percentile  Fetal biometry consistent with [redacted]w[redacted]d (actual gestational age [redacted]w[redacted]d)  Fetal anatomy suboptimally visualized due to fetal position  Umbilical artery Dopplers elevated (>97th percentile)  No evidence of absent or reverse end-diastolic flow today  Labs:  Urinary protein/creatinine ratio: normal, unable to calculate  RPR: nonreactive  CMP: within normal range  CBC: normal, no anemia, normal platelets  Type and screen: up to date  Discussion: Today's ultrasound results and their implications, as well as the follow up plan were discussed directly with the patient.  I evaluated [name], a 23 year old G2P0 at 64 weeks and 3 days gestation, who was referred by Dr. Izell and Dr. Ozan for management of severe fetal growth restriction with newly reported absent end-diastolic flow on umbilical artery Doppler studies performed on 8/19 at [facility]. The patient has been hospitalized and is currently on magnesium  sulfate prophylaxis and has completed a course of betamethasone  for fetal lung maturity. Continuous fetal monitoring has shown a  reactive pattern without decelerations or minimal variability (category one tracing).  Review of her recent ultrasounds showed concerning growth trajectory, with the fetus measuring 441 grams approximately 3 weeks ago and only 541 grams yesterday, representing minimal growth of 100 grams over this period. Today's ultrasound showed an estimated fetal weight of 582 grams (<1st percentile), with all biometry measurements significantly lagging (measuring equivalent to [redacted]w[redacted]d). While yesterday's ultrasound reportedly showed absent end-diastolic flow, today's assessment showed elevated umbilical artery Dopplers (>97th percentile) but without absent or reversed end-diastolic flow.  I discussed with Ms. Dematteo the diagnosis, evaluation, and management of severe fetal growth restriction. We reviewed that IUGR is diagnosed when the estimated fetal weight is below the 10th percentile, with her fetus measuring well below the 1st percentile. I explained that the abnormal Doppler findings increase the risk of stillbirth, which is why hospitalization with steroid administration and magnesium  sulfate was recommended. I advised that while today's Doppler findings were slightly improved (showing elevated resistance but not absent end-diastolic flow), continued close monitoring is essential.  The concerning features in this case include the minimal fetal growth (only 100 grams over 3 weeks) and the history of abnormal Dopplers. I explained that these findings suggest that delivery may be necessary within the next few days to weeks. For now, we will continue inpatient management with q shift NST's, and we will repeat Doppler studies on Friday to reassess the fetal status. Based on those findings and ongoing assessment over the weekend, we will determine the most appropriate management plan at that time. If her Dopplers remain elevated only I can see intensive  outpatient management with a repeat growth 2-2.5 weeks from today.  I  discussed that if Dopplers remain elevated but stable with evidence of appropriate interval growth, continued intensive monitoring (either inpatient or outpatient) could be considered. However, if fetal growth remains minimal and Dopplers worsen to show absent or reversed end-diastolic flow, delivery would likely be the most appropriate next step despite the early gestational age.   I further reviewed that fetal growth restriction (FGR) occurs when a fetus fails to achieve its expected growth potential, often defined as an estimated fetal weight below the 10th percentile for gestational age. Multiple maternal, uterine, placental, and fetal factors can contribute to compromised nutrient and oxygen  transfer, leading to slower growth. Common risk factors include chronic maternal illnesses (e.g., hypertension), lifestyle behaviors (e.g., smoking), uterine or placental abnormalities (e.g., fibroids, infarcts), and fetal anomalies or infections. Ultrasound evaluation--measuring fetal biometry, amniotic fluid volume, and assessing umbilical artery Doppler flow--plays a pivotal role in diagnosing and tracking FGR. If serial assessments remain reassuring, outpatient surveillance is often sufficient; however, severe FGR or nonreassuring fetal testing may warrant closer monitoring or earlier delivery. The overall prognosis hinges on timely detection and management, as well as addressing any underlying causes.  Recommendations - Serial Ultrasound Examinations - Obtain growth ultrasounds every 2 weeks to track biometric parameters (e.g., abdominal circumference, head circumference, femur length). - Assess amniotic fluid index (AFI) or single deepest pocket (SDP) for signs of oligohydramnios or polyhydramnios. - Fetal Well-Being Assessments - Continue NST's q shift. - Address maternal comorbidities (e.g., hypertension, thrombophilias, renal disease). - Advise cessation of smoking or drug use, optimize nutrition, and  consider supplementation (iron, vitamins) if indicated. - Delivery Planning - Choose delivery mode (vaginal vs. cesarean) based on fetal status, obstetric factors, and maternal condition. We discussed that given the early gestational age and abnormal UAD that 75-85% of babies don't tolerate labor, thus we will discuss further cesarean delivery. -NICU consultation performed - Magnesium  for neuroprotection for suspected imminent delivery -Rescue course of steroids if >7 day since last course and < 32 weeks.  2) Chronic Hypertension. Chronic hypertension in pregnancy is defined as blood pressure >=140/90 mm Hg existing before conception or diagnosed before 20 weeks' gestation. It can be categorized as mild (systolic 140-159 mm Hg or diastolic 90-109 mm Hg) or severe (systolic >=160 mm Hg or diastolic >=110 mm Hg), with further risk stratification based on comorbid conditions (e.g., renal disease, diabetes) or end-organ damage. Mild chronic hypertension without end-organ involvement generally carries minimal additional maternal or perinatal risk, but more severe hypertension or superimposed preeclampsia significantly increases the likelihood of complications like placental abruption, stroke, or fetal growth restriction. Effective management balances maternal cardiovascular stability with fetal growth and well-being. Patients benefit from baseline laboratory evaluations (kidney function, liver enzymes, platelets, and assessment for proteinuria) to exclude secondary complications or superimposed preeclampsia. Blood pressure control with medications (labetalol , extended-release nifedipine , or methyldopa) is reserved for severe hypertension or end-organ compromise, with a systolic goal under 145 mm Hg and diastolic goal under 95 mm Hg. Fetal surveillance typically involves serial ultrasounds to track growth and amniotic fluid volume, with antenatal testing introduced when indicated. Delivery at about 39 weeks is  often advisable, although earlier delivery may be required if maternal or fetal status worsens. Recommendations - Baseline and Ongoing Laboratory Testing - Obtain serum creatinine, urine protein/creatinine ratio (or 24-hour urine protein), liver enzymes, and platelet count. (Currently normal). - Repeat these evaluations each trimester or as clinically indicated. - Blood Pressure Monitoring -  Medication Therapy to maintain avg blood pressure 135/85 mmHg- currently on Labetalol . - Avoid ACE inhibitors and ARBs due to teratogenic risk. - Fetal Growth Surveillance - Perform serial ultrasounds starting around 24 weeks and repeat every 4-6 weeks to assess fetal biometry and amniotic fluid. - Antenatal Testing - Individualize antenatal tests if BP remains well controlled without medication. - Initiate twice-weekly NSTs or weekly BPPs at approximately 32 weeks if on antihypertensive therapy or if concerning findings (e.g., IUGR) arise. - Continue management per above FGR recommenations     3) Syphillis In pregnancy, syphilis poses significant risks, particularly during the primary (50% transmission risk) and secondary (40% transmission risk) phases. Primary syphilis presents with a painless chancre, while secondary syphilis involves a generalized skin rash, fever, lymphadenopathy, and large genital lesions (condylomata lata), with spontaneous resolution in 2-6 weeks. Secondary syphilis is often when pregnant women present to healthcare providers.  Latent syphilis can be subclinical, with a vertical transmission risk of about 10%, higher within the first 4 years after infection. Tertiary syphilis, which occurs in untreated cases, can cause gumma formation, cardiovascular issues, and neurosyphilis, typically appearing 5-10 years after latency.  Ultrasound signs of vertical transmission include a hydropic placenta, fetal growth restriction, hydrops, and hepatosplenomegaly, with risks for perinatal death  and long-term neurological impairment in the infant.  Treatment indications include sexual contact with a confirmed syphilis carrier, positive treponemal antibody tests (e.g., FTA-ABS), or rising titers in RPR or VDRL. Primary syphilis is treated with two weekly doses of benzathine penicillin, while latent infections require three weekly doses. She recently received a dose of Pen G. She is uncertain if her partner was treated, however she is no longer involved with him.  Post-treatment, nontreponemal titers should be checked at 1, 3, 6, 12, and 24 months. Titers should decrease four-fold by 6 months and become nonreactive by 12-24 months. Rising titers or inadequate decreases suggest treatment failure or reinfection, warranting repeat treatment and potential evaluation for CNS involvement.     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 CMV-specific hyperimmune globulin (200 mg/kg IV) showed potential in one study Alonso et al., 2005), but these results await broader validation. The ultimate prognosis often reflects the extent of CNS involvement and whether the newborn is symptomatic. Preventive measures--particularly rigorous hand hygiene and minimizing exposure to potentially infectious bodily fluids--are crucial.   For neonates with confirmed infection, collaboration with pediatric specialists is essential to ensure prompt intervention, hearing assessments, and developmental support to mitigate the risk of long-term sequelae.  We recommend NICU be made aware at time of delivery. Recommendations - Conduct serial ultrasounds to detect possible fetal anomalies (e.g., ventriculomegaly, calcifications, growth restriction) - Obtain additional imaging (e.g., MRI) if ultrasound findings are unclear or to further evaluate the CNS - Maternal Counseling and Prevention - Discuss potential neonatal risks and the variable clinical course of congenital CMV - Management  of Confirmed Infection - Coordinate with pediatric specialists to plan for postnatal care and early intervention - Postnatal Follow-Up - Evaluate neonates for hearing loss, vision issues, and developmental delays - Monitor growth and neurodevelopment to address any late-onset sequelae promptly    Recommendations: 1. Continue inpatient monitoring with NSTs every shift 2. Repeat Doppler studies on Friday (8/22) 3. Continue to monitor over the weekend to better assess fetal status 4. Consider delivery if abnormal NST's, Dopplers worsen to absent/reversed end-diastolic flow or if minimal interval growth persists 5. If delivery given Abnormal UAD and small gestation revisit  counsel regarding risk for fetal labor intolerance and role of cesarean delivery.  Thank you for allowing us  to participate in the care of your patient today. At the end of our discussion the patient voiced understanding and agreement with our stated plan of care. All of her questions and concerns were addressed and answered to her satisfaction.  Decision making was high complexity based on problem list, medical history and conditions complicating pregnancy.  80 minutes of total time on the day of the encounter spent in preparation for the clinic visit, reviewing records, tests, counseling and education of the patient, ordering medications, clinical tests/labs and procedures as deemed appropriate and documenting clinical information.  Nathanel DOROTHA Fetters, MD

## 2024-06-10 ENCOUNTER — Encounter (HOSPITAL_COMMUNITY): Payer: Self-pay | Admitting: Obstetrics and Gynecology

## 2024-06-10 MED ORDER — FAMOTIDINE 20 MG PO TABS
20.0000 mg | ORAL_TABLET | Freq: Two times a day (BID) | ORAL | Status: DC | PRN
  Administered 2024-06-10: 20 mg via ORAL
  Filled 2024-06-10: qty 1

## 2024-06-10 MED ORDER — CALCIUM CARBONATE ANTACID 500 MG PO CHEW: 1.0000 | CHEWABLE_TABLET | Freq: Two times a day (BID) | ORAL | Status: DC | PRN

## 2024-06-10 MED ORDER — FAMOTIDINE 20 MG PO TABS: 20.0000 mg | ORAL_TABLET | Freq: Two times a day (BID) | ORAL | Status: DC | PRN

## 2024-06-10 NOTE — Progress Notes (Signed)
 Daily Antepartum Note  Admission Date: 06/08/2024 Current Date: 06/10/2024 7:12 AM  Taylor Jefferson is a 23 y.o. G2P0010 at [redacted]w[redacted]d, admitted for poor interval fetal growth.  Pregnancy complicated by: Patient Active Problem List   Diagnosis Date Noted   Exposure to syphilis 06/08/2024   Chronic hypertension during pregnancy, antepartum 06/08/2024   IUGR (intrauterine growth restriction) affecting care of mother 06/08/2024   CMV: elevated IgG and IgM which is concerning for recent CMV infection: although IgM for CMV may remain elevated for months to years 05/25/2024   Marginal insertion of umbilical cord affecting management of mother in second trimester 03/29/2024   Fetal growth restriction antepartum 03/29/2024   Supervision of high risk pregnancy, antepartum 02/09/2024   Herpes 05/30/2022   Marijuana use 05/17/2021   Recurrent major depressive disorder (HCC) 05/17/2021   Unspecified mood (affective) disorder (HCC) 05/17/2021   Anxiety disorder, unspecified 05/17/2021   Intentional overdose of drug in tablet form (HCC) 05/16/2021   Mild persistent asthma without complication 02/29/2020   Chlamydia 09/27/2019   Flat feet, bilateral 10/09/2018   MDD (major depressive disorder), recurrent severe, 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   Migraine without aura and without status migrainosus, not intractable 12/24/2016   Anxiety state 01/04/2015    Overnight/24hr events:  none  Subjective:  No complaints  Objective:    Current Vital Signs 24h Vital Sign Ranges  T 97.8 F (36.6 C) Temp  Avg: 98 F (36.7 C)  Min: 97.6 F (36.4 C)  Max: 98.5 F (36.9 C)  BP 123/78 BP  Min: 119/69  Max: 133/87  HR 78 Pulse  Avg: 83  Min: 78  Max: 86  RR 16 Resp  Avg: 16.4  Min: 16  Max: 18  SaO2 100 % Room Air SpO2  Avg: 100 %  Min: 100 %  Max: 100 %       24 Hour I/O Current Shift I/O  Time Ins Outs No intake/output data recorded. No intake/output data recorded.   Patient Vitals for the past  24 hrs:  BP Temp Temp src Pulse Resp SpO2  06/09/24 2354 123/78 97.8 F (36.6 C) Oral 78 16 100 %  06/09/24 1934 133/87 97.6 F (36.4 C) Oral 83 16 100 %  06/09/24 1605 119/69 98.5 F (36.9 C) Oral 82 16 100 %  06/09/24 1231 131/84 -- -- 86 18 --  06/09/24 0825 -- -- -- -- -- 100 %  06/09/24 0824 129/89 97.9 F (36.6 C) Oral 86 16 --    Fetal Heart Tones: 130 baseline, +accels, no decel, mod variability Tocometry: one UC  Physical exam: General: Well nourished, well developed female in no acute distress. Abdomen: gravid nttp Respiratory: no respiratory distress Skin: Warm and dry.   Medications: Current Facility-Administered Medications  Medication Dose Route Frequency Provider Last Rate Last Admin   acetaminophen  (TYLENOL ) tablet 650 mg  650 mg Oral Q4H PRN Izell Harari, MD   650 mg at 06/09/24 2357   aspirin  EC tablet 81 mg  81 mg Oral Daily Izell Harari, MD       calcium  carbonate (TUMS - dosed in mg elemental calcium ) chewable tablet 400 mg of elemental calcium   2 tablet Oral Q4H PRN Izell Harari, MD       labetalol  (NORMODYNE ) tablet 100 mg  100 mg Oral BID Constant, Peggy, MD   100 mg at 06/09/24 2137   metroNIDAZOLE  (FLAGYL ) tablet 500 mg  500 mg Oral BID Anyanwu, Ugonna A, MD   500 mg  at 06/09/24 2137   prenatal multivitamin tablet 1 tablet  1 tablet Oral Q1200 Izell Harari, MD   1 tablet at 06/09/24 1239   valACYclovir  (VALTREX ) tablet 500 mg  500 mg Oral BID Izell Harari, MD   500 mg at 06/09/24 2136   Labs:  Recent Labs  Lab 06/08/24 1705  WBC 6.0  HGB 13.5  HCT 39.4  PLT 173   Recent Labs  Lab 06/08/24 1705  NA 134*  K 3.8  CL 105  CO2 20*  BUN 8  CREATININE 0.59  CALCIUM  8.9  PROT 7.4  BILITOT 0.6  ALKPHOS 136*  ALT 19  AST 19  GLUCOSE 71   Radiology:  8/20: <1%, 582g, afi 9.4, elevated UADs, breech 8/[facility]: 541g, afi 10, bpp 8/8, AEDF, frank breech 8/14: fetal echo wnl, elevated UADs 7/29 at [facility]:  442g, AEDF  Assessment & Plan:  Patient stable *Pregnancy: routine care *FGR: s/p MFM consult. Qshift NSTs, repeat dopplers on 8/22.  -patient already consented for c/s. D/w her that this would be the recommended mode of delivery when the time comes *Preterm: s/p bmz on 8/19-20 and Mg x 12h on admit, NICU consult *CHTN: continue labetalol   *PPx: SCDs, OOB ad lib *FEN/GI: regular diet *Dispo: inpatient until delivery  Harari Izell Raddle MD Attending Center for [facility] GYN Consult Phone: [phone] (M-F, [phone]) & [phone]  (Off hours, weekends, holidays)

## 2024-06-10 NOTE — Plan of Care (Signed)
  Problem: Education: Goal: Knowledge of General Education information will improve Description: Including pain rating scale, medication(s)/side effects and non-pharmacologic comfort measures Outcome: Progressing   Problem: Health Behavior/Discharge Planning: Goal: Ability to manage health-related needs will improve Outcome: Progressing   Problem: Clinical Measurements: Goal: Ability to maintain clinical measurements within normal limits will improve Outcome: Progressing Goal: Will remain free from infection Outcome: Progressing Goal: Diagnostic test results will improve Outcome: Progressing Goal: Respiratory complications will improve Outcome: Progressing Goal: Cardiovascular complication will be avoided Outcome: Progressing   Problem: Activity: Goal: Risk for activity intolerance will decrease Outcome: Progressing   Problem: Nutrition: Goal: Adequate nutrition will be maintained Outcome: Progressing   Problem: Coping: Goal: Level of anxiety will decrease Outcome: Progressing   Problem: Elimination: Goal: Will not experience complications related to bowel motility Outcome: Progressing Goal: Will not experience complications related to urinary retention Outcome: Progressing   Problem: Pain Managment: Goal: General experience of comfort will improve and/or be controlled Outcome: Progressing   Problem: Safety: Goal: Ability to remain free from injury will improve Outcome: Progressing   Problem: Skin Integrity: Goal: Risk for impaired skin integrity will decrease Outcome: Progressing   Problem: Education: Goal: Knowledge of disease or condition will improve Outcome: Progressing Goal: Knowledge of the prescribed therapeutic regimen will improve Outcome: Progressing   Problem: Clinical Measurements: Goal: Complications related to the disease process, condition or treatment will be avoided or minimized Outcome: Progressing

## 2024-06-11 ENCOUNTER — Inpatient Hospital Stay (HOSPITAL_COMMUNITY)

## 2024-06-11 DIAGNOSIS — O43193 Other malformation of placenta, third trimester: Secondary | ICD-10-CM

## 2024-06-11 DIAGNOSIS — Z3A29 29 weeks gestation of pregnancy: Secondary | ICD-10-CM

## 2024-06-11 DIAGNOSIS — O133 Gestational [pregnancy-induced] hypertension without significant proteinuria, third trimester: Secondary | ICD-10-CM

## 2024-06-11 DIAGNOSIS — O365930 Maternal care for other known or suspected poor fetal growth, third trimester, not applicable or unspecified: Secondary | ICD-10-CM

## 2024-06-11 LAB — COMPREHENSIVE METABOLIC PANEL WITH GFR
ALT: 14 U/L (ref 0–44)
ALT: 13 U/L (ref 0–44)
AST: 16 U/L (ref 15–41)
AST: 19 U/L (ref 15–41)
Albumin: 3.1 g/dL — ABNORMAL LOW (ref 3.5–5.0)
Albumin: 3.2 g/dL — ABNORMAL LOW (ref 3.5–5.0)
Alkaline Phosphatase: 107 U/L (ref 38–126)
Alkaline Phosphatase: 134 U/L — ABNORMAL HIGH (ref 38–126)
Anion gap: 8 (ref 5–15)
Anion gap: 11 (ref 5–15)
BUN: 12 mg/dL (ref 6–20)
BUN: 14 mg/dL (ref 6–20)
CO2: 21 mmol/L — ABNORMAL LOW (ref 22–32)
CO2: 20 mmol/L — ABNORMAL LOW (ref 22–32)
Calcium: 8.6 mg/dL — ABNORMAL LOW (ref 8.9–10.3)
Calcium: 9.1 mg/dL (ref 8.9–10.3)
Chloride: 109 mmol/L (ref 98–111)
Chloride: 104 mmol/L (ref 98–111)
Creatinine, Ser: 0.65 mg/dL (ref 0.44–1.00)
Creatinine, Ser: 0.69 mg/dL (ref 0.44–1.00)
GFR, Estimated: >60 mL/min (ref >60)
GFR, Estimated: >60 mL/min (ref >60)
Glucose, Bld: 79 mg/dL (ref 70–99)
Glucose, Bld: 63 mg/dL — ABNORMAL LOW (ref 70–99)
Potassium: 4.1 mmol/L (ref 3.5–5.1)
Potassium: 3.8 mmol/L (ref 3.5–5.1)
Sodium: 138 mmol/L (ref 135–145)
Sodium: 135 mmol/L (ref 135–145)
Total Bilirubin: 0.4 mg/dL (ref 0.0–1.2)
Total Bilirubin: 0.4 mg/dL (ref 0.0–1.2)
Total Protein: 6.8 g/dL (ref 6.5–8.1)
Total Protein: 7.2 g/dL (ref 6.5–8.1)

## 2024-06-11 LAB — CBC
HCT: 33.8 % — ABNORMAL LOW (ref 36.0–46.0)
HCT: 37.6 % (ref 36.0–46.0)
Hemoglobin: 11.8 g/dL — ABNORMAL LOW (ref 12.0–15.0)
Hemoglobin: 12.9 g/dL (ref 12.0–15.0)
MCH: 31.2 pg (ref 26.0–34.0)
MCH: 30.6 pg (ref 26.0–34.0)
MCHC: 34.9 g/dL (ref 30.0–36.0)
MCHC: 34.3 g/dL (ref 30.0–36.0)
MCV: 89.4 fL (ref 80.0–100.0)
MCV: 89.1 fL (ref 80.0–100.0)
Platelets: 149 K/uL — ABNORMAL LOW (ref 150–400)
Platelets: 177 K/uL (ref 150–400)
RBC: 3.78 MIL/uL — ABNORMAL LOW (ref 3.87–5.11)
RBC: 4.22 MIL/uL (ref 3.87–5.11)
RDW: 12.1 % (ref 11.5–15.5)
RDW: 12.2 % (ref 11.5–15.5)
WBC: 7.6 K/uL (ref 4.0–10.5)
WBC: 8.4 K/uL (ref 4.0–10.5)
nRBC: 0 % (ref 0.0–0.2)
nRBC: 0 % (ref 0.0–0.2)

## 2024-06-11 LAB — TYPE AND SCREEN
ABO/RH(D): B POS
Antibody Screen: NEGATIVE

## 2024-06-11 LAB — PROTEIN / CREATININE RATIO, URINE
Creatinine, Urine: 95 mg/dL
Protein Creatinine Ratio: 0.19 mg/mg{creat} — ABNORMAL HIGH (ref 0.00–0.15)
Total Protein, Urine: 18 mg/dL

## 2024-06-11 MED ORDER — SODIUM CHLORIDE 0.9% FLUSH
3.0000 mL | Freq: Two times a day (BID) | INTRAVENOUS | Status: DC
  Administered 2024-06-11 – 2024-06-24 (×26): 3 mL via INTRAVENOUS

## 2024-06-11 MED ORDER — LACTATED RINGERS IV BOLUS
500.0000 mL | Freq: Once | INTRAVENOUS | Status: AC
  Administered 2024-06-11: 500 mL via INTRAVENOUS

## 2024-06-11 MED ORDER — NIFEDIPINE ER OSMOTIC RELEASE 30 MG PO TB24
30.0000 mg | ORAL_TABLET | Freq: Two times a day (BID) | ORAL | Status: DC
  Administered 2024-06-11 – 2024-06-21 (×22): 30 mg via ORAL
  Filled 2024-06-11 (×22): qty 1

## 2024-06-11 MED ORDER — SODIUM CHLORIDE 0.9% FLUSH: 3.0000 mL | INTRAVENOUS | Status: DC | PRN

## 2024-06-11 NOTE — Consult Note (Signed)
 MFM Consult Note  Taylor Jefferson is a 23 year old G2, P0 currently at 29 weeks and 5 days.  She has been hospitalized due to severe IUGR with intermittent absent end-diastolic flow noted on her umbilical artery Doppler studies.  The overall EFW obtained 2 days ago when she was admitted to the hospital showed that the fetus weighs only 582 g (1 pound 5 ounces, less than the 1st percentile for her gestational age).  Absent end-diastolic flow was noted on her umbilical artery Doppler studies performed prior to admission to the hospital.  The umbilical artery Doppler studies performed while she has been hospitalized have shown an elevated S/D ratio without any signs of absent or reversed end-diastolic flow.    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  Her PIH labs today were all within normal limits.  Her blood pressures since admission have been mostly in the 120 over 70s to 80s range.  Her P/C ratio performed 3 days ago showed very little protein, indicating that she has not developed preeclampsia at this time.  She has already received a complete course of antenatal corticosteroids and magnesium  sulfate for fetal neuroprotection.  Her fetal heart rate tracing was reassuring this morning.  An ultrasound performed this morning showed normal amniotic fluid with a total AFI of 10.9 cm.    The umbilical artery Doppler studies performed today continues to show an elevated S/D ratio of 8.5.  There were no signs of absent or reversed end-diastolic flow noted today.    Doppler studies of the ductus venosus performed today showed a normal A wave, indicating that the risk for an IUFD is low at this time.  The fetus was in the breech presentation today.    There were no sonographic signs of a congenital CMV infection, such as ventriculomegaly  in the fetal brain or intra-abdominal calcifications noted on today's exam.    The fundal placenta appeared small with multiple echogenic patches noted within the placenta, indicating probable areas of infarction.  The patient was advised that placental dysfunction is the most likely cause of IUGR with abnormal umbilical artery Doppler studies.  Due to the increased risk of fetal demise associated with severe IUGR related to abnormal placental function, she should continue inpatient management until delivery.  While hospitalized, she should continue fetal monitoring every shift.  Her umbilical artery Doppler studies and amniotic fluid checks should be performed twice a week.  A fetal growth scan should be performed in 2 weeks.  The patient understands that the goal for her delivery would be at around 34 weeks.  However, delivery is also recommended should there be absent or minimal fetal growth in 2 weeks.  Delivery is recommended at any time for nonreassuring fetal status, should her umbilical artery Doppler studies show reversed end-diastolic flow, or should she develop preeclampsia.  The patient understands that severe IUGR due to placental dysfunction increases her risk of developing preeclampsia.    Her PIH labs should be monitored twice a week.  Her baby should be tested for congenital CMV infection after birth.  Due to the small fetal size, magnesium  sulfate should be given for fetal neuroprotection should she require delivery.  Ideally, if time allows, she should receive magnesium  sulfate for at least 3 to 4 hours prior to delivery.  She should receive a rescue course (2 doses)  of antenatal corticosteroids should she require delivery prior to 34 weeks and it has been greater than 1 week since she received the initial course.  We will continue to follow her closely with you.    The patient stated that all of her questions were answered.

## 2024-06-11 NOTE — Progress Notes (Signed)
 FACULTY PRACTICE ANTEPARTUM(COMPREHENSIVE) NOTE  Taylor Jefferson is a 23 y.o. G2P0010 with Estimated Date of Delivery: 08/22/24   By  early ultrasound [redacted]w[redacted]d  who is admitted for FGR with AEDF/poor interval fetal growth.    Fetal presentation is breech. Length of Stay:  3  Days  Date of admission:06/08/2024  Subjective: Resting comfortably in bed- reports no acute complaints.   Patient reports the fetal movement as active. Patient reports uterine contraction  activity as none. Patient reports  vaginal bleeding as none. Patient describes fluid per vagina as None.  Vitals:  Blood pressure 129/75, pulse 66, temperature 98 F (36.7 C), temperature source Oral, resp. rate 14, height 5' 3 (1.6 m), weight 57.9 kg, last menstrual period 11/16/2023, SpO2 99%. Vitals:   06/10/24 1622 06/10/24 1940 06/10/24 2137 06/11/24 0500  BP: 111/64 (!) 137/90  129/75  Pulse: 82 76 85 66  Resp: 15 15  14   Temp: 98.1 F (36.7 C) 98.1 F (36.7 C)  98 F (36.7 C)  TempSrc: Oral Oral  Oral  SpO2: 99% 100%  99%  Weight:      Height:       Physical Examination:  General appearance - alert, well appearing, and in no distress Mental status - normal mood, behavior, speech, dress, motor activity, and thought processes Chest - CTAB, normal respiratory effort Heart - normal rate and regular rhythm Abdomen - gravid, soft and non-tender, no rebound, no guarding Musculoskeletal - no calf tenderness bilaterally Extremities - no pedal edema noted Skin - warm and dry   Fetal Monitoring:  Baseline: 140 bpm, Variability: moderate, Accelerations: +15x15 x 2, and Decelerations: Absent      reactive  Labs:  Results for orders placed or performed during the hospital encounter of 06/08/24 (from the past 24 hours)  Type and screen [facility]   Collection Time: 06/11/24 12:41 AM  Result Value Ref Range   ABO/RH(D) B POS    Antibody Screen NEG    Sample Expiration      06/14/2024,2359 Performed  at [facility], [address], [place], [place] [address]   CBC   Collection Time: 06/11/24 12:43 AM  Result Value Ref Range   WBC 7.6 4.0 - 10.5 K/uL   RBC 3.78 (L) 3.87 - 5.11 MIL/uL   Hemoglobin 11.8 (L) 12.0 - 15.0 g/dL   HCT 66.1 (L) 63.9 - 53.9 %   MCV 89.4 80.0 - 100.0 fL   MCH 31.2 26.0 - 34.0 pg   MCHC 34.9 30.0 - 36.0 g/dL   RDW 87.8 88.4 - 84.4 %   Platelets 149 (L) 150 - 400 K/uL   nRBC 0.0 0.0 - 0.2 %    Imaging Studies:    US  MFM UA CORD DOPPLER Result Date: 06/09/2024 ----------------------------------------------------------------------  OBSTETRICS REPORT                        (Signed Final 06/09/2024 05:15 pm) ---------------------------------------------------------------------- Patient Info  ID #:       [id number]                          D.O.B.:  2000-10-28 (22 yrs)(F)  Name:       Taylor Jefferson                Visit Date: 06/09/2024 08:45 am ---------------------------------------------------------------------- Performed By  Attending:        Nathanel Fetters  Secondary Phy.:    [facility]                    MD                                                              Care  Performed By:     Heather  Waken BS       [facility]  Referred By:      [facility] MAU/Triage ---------------------------------------------------------------------- Orders  #  Description                           Code        Ordered By  1  US  MFM UA CORD DOPPLER                76820.02    CORENTHIAN                                                       BOOKER  2  US  MFM OB COMP + 14 WK                76805.01    NATHANEL FETTERS ----------------------------------------------------------------------  #  Order #                     Accession #                Episode #  1  [id number]                   [id number]                 [id number]  2   [id number]                   [id number]                 [id number] ---------------------------------------------------------------------- Indications  [redacted] weeks gestation of pregnancy                 Z3A.29  Encounter for antenatal screening,              Z36.9  unspecified  Maternal care for known or suspected poor       O36.5930  fetal growth, third trimester, single or  unspecified fetus IUGR  Marginal insertion of umbilical  cord affecting  O43.193  management of mother in third trimester ---------------------------------------------------------------------- Fetal Evaluation  Num Of Fetuses:          1  Fetal Heart Rate(bpm):   123  Cardiac Activity:        Observed  Presentation:            Breech  Placenta:                Fundal  P. Cord Insertion:       Marginal insertion  Amniotic Fluid  AFI FV:      Within normal limits  AFI Sum(cm)     %Tile       Largest Pocket(cm)  9.4             7           3.4  RUQ(cm)       RLQ(cm)       LUQ(cm)        LLQ(cm)  1.1           3.4           2.1            2.8 ---------------------------------------------------------------------- Biometry  BPD:      61.7  mm     G. Age:  25w 0d        < 1  %    CI:        82.91   %    70 - 86                                                          FL/HC:       18.6  %    19.6 - 20.8  HC:      213.7  mm     G. Age:  23w 3d        < 1  %    HC/AC:       1.14       0.99 - 1.21  AC:      187.4  mm     G. Age:  23w 4d        < 1  %    FL/BPD:      64.3  %    71 - 87  FL:       39.7  mm     G. Age:  22w 6d        < 1  %    FL/AC:       21.2  %    20 - 24  Est. FW:     582   gm     1 lb 5 oz    < 1  % ---------------------------------------------------------------------- OB History  Gravidity:    2          SAB:   1 ---------------------------------------------------------------------- Gestational Age  LMP:           29w 3d        Date:  11/16/23                  EDD:   08/22/24  U/S Today:     23w 5d  EDD:    10/01/24  Best:          29w 3d     Det. By:  LMP  (11/16/23)          EDD:   08/22/24 ---------------------------------------------------------------------- Anatomy  Cranium:               Appears normal         LVOT:                   Appears normal  Cavum:                 Appears normal         Aortic Arch:            Not well visualized  Ventricles:            Appears normal         Ductal Arch:            Not well visualized  Choroid Plexus:        Appears normal         Diaphragm:              Appears normal  Cerebellum:            Appears normal         Stomach:                Appears normal, left                                                                        sided  Posterior Fossa:       Appears normal         Abdomen:                Appears normal  Nuchal Fold:           Not applicable (>20    Abdominal Wall:         Not well visualized                         wks GA)  Face:                  Not well visualized    Cord Vessels:           Appears normal (3                                                                        vessel cord)  Lips:                  Not well visualized    Kidneys:                Appear normal  Palate:                Not well visualized    Bladder:  Appears normal  Thoracic:              Appears normal         Spine:                  Ltd views no                                                                        intracranial signs of                                                                        NTD  Heart:                 Not well visualized    Upper Extremities:      Not well visualized  RVOT:                  Not well visualized    Lower Extremities:      Not well visualized  Other:  Technically difficult due to fetal position. ---------------------------------------------------------------------- Doppler - Fetal Vessels  Umbilical Artery    S/D    %tile      RI    %tile      PI    %tile     PSV    ADFV    RDFV                                                      (cm/s)    6.5   > 97.5     0.8   > 97.5     1.8   > 97.5     24.4      No      No ---------------------------------------------------------------------- Impression  Single intrauterine pregnancy here for a detailed anatomy  due to severe FGR  Normal anatomy with measurements consistent with sever  fetal growth  There is good fetal movement and amniotic fluid volume  Suboptimal views of the fetal anatomy were obtained  secondary to fetal position.  UAD are elevated without evidence of AEDF or REDF.  MFM Consultation placed. ---------------------------------------------------------------------- Recommendations  See MFM recommendations  Repeat UAD/AFI on Friday. ----------------------------------------------------------------------              Nathanel Fetters, MD Electronically Signed Final Report   06/09/2024 05:15 pm ----------------------------------------------------------------------   US  MFM OB COMP + 14 WK Result Date: 06/09/2024 ----------------------------------------------------------------------  OBSTETRICS REPORT                        (Signed Final 06/09/2024 05:15 pm) ---------------------------------------------------------------------- Patient Info  ID #:       [id number]  D.O.B.:  [birth year] (22 yrs)(F)  Name:       Taylor Jefferson                Visit Date: 06/09/2024 08:45 am ---------------------------------------------------------------------- Performed By  Attending:        Nathanel Fetters      Secondary Phy.:    [facility]                    MD                                                              Care  Performed By:     Heather  Waken BS       [facility]  Referred By:      [facility] MAU/Triage ---------------------------------------------------------------------- Orders  #  Description                           Code        Ordered By  1  US  MFM UA  CORD DOPPLER                76820.02    CORENTHIAN                                                       BOOKER  2  US  MFM OB COMP + 14 WK                76805.01    NATHANEL FETTERS ----------------------------------------------------------------------  #  Order #                     Accession #                Episode #  1  [id number]                   [id number]                 [id number]  2  [id number]                   [id number]                 [id number] ---------------------------------------------------------------------- Indications  [redacted] weeks gestation of pregnancy  Z3A.29  Encounter for antenatal screening,              Z36.9  unspecified  Maternal care for known or suspected poor       O36.5930  fetal growth, third trimester, single or  unspecified fetus IUGR  Marginal insertion of umbilical cord affecting  O43.193  management of mother in third trimester ---------------------------------------------------------------------- Fetal Evaluation  Num Of Fetuses:          1  Fetal Heart Rate(bpm):   123  Cardiac Activity:        Observed  Presentation:            Breech  Placenta:                Fundal  P. Cord Insertion:       Marginal insertion  Amniotic Fluid  AFI FV:      Within normal limits  AFI Sum(cm)     %Tile       Largest Pocket(cm)  9.4             7           3.4  RUQ(cm)       RLQ(cm)       LUQ(cm)        LLQ(cm)  1.1           3.4           2.1            2.8 ---------------------------------------------------------------------- Biometry  BPD:      61.7  mm     G. Age:  25w 0d        < 1  %    CI:        82.91   %    70 - 86                                                          FL/HC:       18.6  %    19.6 - 20.8  HC:      213.7  mm     G. Age:  23w 3d        < 1  %    HC/AC:       1.14       0.99 - 1.21  AC:      187.4  mm     G. Age:  23w 4d        < 1  %    FL/BPD:      64.3  %    71 - 87  FL:       39.7  mm     G. Age:  22w  6d        < 1  %    FL/AC:       21.2  %    20 - 24  Est. FW:     582   gm     1 lb 5 oz    < 1  % ---------------------------------------------------------------------- OB History  Gravidity:    2          SAB:   1 ---------------------------------------------------------------------- Gestational Age  LMP:           29w 3d  Date:  11/16/23                  EDD:   08/22/24  U/S Today:     23w 5d                                        EDD:   10/01/24  Best:          29w 3d     Det. By:  LMP  (11/16/23)          EDD:   08/22/24 ---------------------------------------------------------------------- Anatomy  Cranium:               Appears normal         LVOT:                   Appears normal  Cavum:                 Appears normal         Aortic Arch:            Not well visualized  Ventricles:            Appears normal         Ductal Arch:            Not well visualized  Choroid Plexus:        Appears normal         Diaphragm:              Appears normal  Cerebellum:            Appears normal         Stomach:                Appears normal, left                                                                        sided  Posterior Fossa:       Appears normal         Abdomen:                Appears normal  Nuchal Fold:           Not applicable (>20    Abdominal Wall:         Not well visualized                         wks GA)  Face:                  Not well visualized    Cord Vessels:           Appears normal (3  vessel cord)  Lips:                  Not well visualized    Kidneys:                Appear normal  Palate:                Not well visualized    Bladder:                Appears normal  Thoracic:              Appears normal         Spine:                  Ltd views no                                                                        intracranial signs of                                                                        NTD  Heart:                  Not well visualized    Upper Extremities:      Not well visualized  RVOT:                  Not well visualized    Lower Extremities:      Not well visualized  Other:  Technically difficult due to fetal position. ---------------------------------------------------------------------- Doppler - Fetal Vessels  Umbilical Artery    S/D    %tile      RI    %tile      PI    %tile     PSV    ADFV    RDFV                                                     (cm/s)    6.5   > 97.5     0.8   > 97.5     1.8   > 97.5     24.4      No      No ---------------------------------------------------------------------- Impression  Single intrauterine pregnancy here for a detailed anatomy  due to severe FGR  Normal anatomy with measurements consistent with sever  fetal growth  There is good fetal movement and amniotic fluid volume  Suboptimal views of the fetal anatomy were obtained  secondary to fetal position.  UAD are elevated without evidence of AEDF or REDF.  MFM Consultation placed. ---------------------------------------------------------------------- Recommendations  See MFM recommendations  Repeat UAD/AFI on Friday. ----------------------------------------------------------------------              Nathanel Fetters, MD Electronically Signed Final Report  06/09/2024 05:15 pm ----------------------------------------------------------------------   US  OB Follow Up Result Date: 06/08/2024 Table formatting from the original result was not included. Images from the original result were not included.  ..an [employer] of Ultrasound Medicine Technical sales engineer) accredited practice Center for [facility] [address] [place] [address] Ordering Provider: Shiana Rappleye, DO FOLLOW UP SONOGRAM Taylor Jefferson is in the office for a follow up sonogram for EFW,BPP and cord dopplers. She is a 23 y.o. year old G2P0010 with Estimated Date of Delivery: 08/22/24 by LMP now at  [redacted]w[redacted]d weeks gestation. Thus  far the pregnancy has been complicated by [facility]. GESTATION: SINGLETON PRESENTATION: breech frank FETAL ACTIVITY:          Heart rate         143          The fetus is active. AMNIOTIC FLUID: The amniotic fluid volume is  normal, 10 cm. PLACENTA LOCALIZATION:  fundal, posterior  small thick placenta GRADE 0 CERVIX: Limited view GESTATIONAL AGE AND  BIOMETRICS: Gestational criteria: Estimated Date of Delivery: 08/22/24 by LMP now at [redacted]w[redacted]d Previous Scans:10          BIPARIETAL DIAMETER           6.08 cm         24+5 weeks HEAD CIRCUMFERENCE           23 cm         25 weeks ABDOMINAL CIRCUMFERENCE           17.56 cm         22+3 weeks FEMUR LENGTH           3.92 cm         22+4 weeks                                                       AVERAGE EGA(BY THIS SCAN):  24+4 weeks                                                 ESTIMATED FETAL WEIGHT:       541  grams BIOPHYSICAL PROFILE:                                                                                                      COMMENTS GROSS BODY MOVEMENT                 2  TONE                2  RESPIRATIONS                2  AMNIOTIC FLUID                2  SCORE:  8/8 (Note: NST was not performed as part of this antepartum testing)  DOPPLER FLOW STUDIES: UMBILICAL ARTERY RI RATIOS:   absent EDF ANATOMICAL SURVEY                                                                            COMMENTS CEREBRAL VENTRICLES yes normal  CHOROID PLEXUS yes normal  CEREBELLUM yes normal  CISTERNA MAGNA  Yes  normal   CAVUM SEPTI PELLUCIDI YES NORMAL  NUCHAL REGION yes normal              FACIAL PROFILE yes normal  4 CHAMBERED HEART yes abnormal Abnormal cardiac axis  OUTFLOW TRACTS YES normaL  3VV YES NORMAL  3VTV YES NORMAL  SITUS YES NORMAL      DIAPHRAGM yes normal  STOMACH yes normal  RENAL REGION yes normal  BLADDER yes normal  PLACENTA CORD INSERTION Yes   abormal  Marginal cord insertion ABDOMINAL CORD INSERTION  YES NORMAL  3 VESSEL CORD yes normal              GENITALIA   female     SUSPECTED ABNORMALITIES:  yes QUALITY OF SCAN: satisfactory TECHNICIAN COMMENTS: US  29+2 wks,frank breech,small thick posterior fundal placenta  gr 0,absent EDF,FHR 143 bpm,AFI 10 cm,BPP 8/8,abnormal cardiac axis,EFW 541 g,marginal cord insertion A copy of this report including all images has been saved and backed up to a second source for retrieval if needed. All measures and details of the anatomical scan, placentation, fluid volume and pelvic anatomy are contained in that report. Taylor Jefferson 06/08/2024 3:24 PM Clinical Impression and recommendations: I have reviewed the sonogram results above, combined with the patient's current clinical course, below are my impressions and any appropriate recommendations for management based on the sonographic findings. 1.  G2P0010 Estimated Date of Delivery: 08/22/24 by serial sonographic evaluations 2.  Fetal sonographic surveillance findings: a). Normal fluid volume b). Normal antepartum fetal assessment with BPP 8/8 c). Abnormal dopplers with absent end diastolic flow d). Severe fetal growth restriction with minimal weight gain.  Current weight: 541g Cardiac axis deviation and marginal cord insertion previously noted. 3.  Normal general sonographic findings Recommend hospital admission and coordination of care with Maternal-Fetal medicine. Taylor Jefferson 06/08/2024 3:39 PM    US  UA Cord Doppler Result Date: 06/08/2024 Table formatting from the original result was not included. Images from the original result were not included.  ..an [employer] of [employer]trasound Medicine Technical sales engineer) accredited practice Center for [facility] [address] [place] [address] Ordering Provider: Yadiel Aubry, DO FOLLOW UP SONOGRAM Taylor Jefferson is in the office for a follow up sonogram for EFW,BPP and cord dopplers. She is a 23 y.o. year old G2P0010 with Estimated Date of Delivery: 08/22/24  by LMP now at  [redacted]w[redacted]d weeks gestation. Thus far the pregnancy has been complicated by [facility]. GESTATION: SINGLETON PRESENTATION: breech frank FETAL ACTIVITY:          Heart rate         143          The fetus is active. AMNIOTIC FLUID: The amniotic fluid volume is  normal, 10 cm. PLACENTA  LOCALIZATION:  fundal, posterior  small thick placenta GRADE 0 CERVIX: Limited view GESTATIONAL AGE AND  BIOMETRICS: Gestational criteria: Estimated Date of Delivery: 08/22/24 by LMP now at [redacted]w[redacted]d Previous Scans:10          BIPARIETAL DIAMETER           6.08 cm         24+5 weeks HEAD CIRCUMFERENCE           23 cm         25 weeks ABDOMINAL CIRCUMFERENCE           17.56 cm         22+3 weeks FEMUR LENGTH           3.92 cm         22+4 weeks                                                       AVERAGE EGA(BY THIS SCAN):  24+4 weeks                                                 ESTIMATED FETAL WEIGHT:       541  grams BIOPHYSICAL PROFILE:                                                                                                      COMMENTS GROSS BODY MOVEMENT                 2  TONE                2  RESPIRATIONS                2  AMNIOTIC FLUID                2                                                          SCORE:  8/8 (Note: NST was not performed as part of this antepartum testing)  DOPPLER FLOW STUDIES: UMBILICAL ARTERY RI RATIOS:   absent EDF ANATOMICAL SURVEY                                                                            COMMENTS CEREBRAL VENTRICLES  yes normal  CHOROID PLEXUS yes normal  CEREBELLUM yes normal  CISTERNA MAGNA  Yes  normal   CAVUM SEPTI PELLUCIDI YES NORMAL  NUCHAL REGION yes normal              FACIAL PROFILE yes normal  4 CHAMBERED HEART yes abnormal Abnormal cardiac axis  OUTFLOW TRACTS YES normaL  3VV YES NORMAL  3VTV YES NORMAL  SITUS YES NORMAL      DIAPHRAGM yes normal  STOMACH yes normal  RENAL REGION yes normal  BLADDER yes normal  PLACENTA CORD INSERTION Yes   abormal   Marginal cord insertion ABDOMINAL CORD INSERTION YES NORMAL  3 VESSEL CORD yes normal              GENITALIA   female     SUSPECTED ABNORMALITIES:  yes QUALITY OF SCAN: satisfactory TECHNICIAN COMMENTS: US  29+2 wks,frank breech,small thick posterior fundal placenta  gr 0,absent EDF,FHR 143 bpm,AFI 10 cm,BPP 8/8,abnormal cardiac axis,EFW 541 g,marginal cord insertion A copy of this report including all images has been saved and backed up to a second source for retrieval if needed. All measures and details of the anatomical scan, placentation, fluid volume and pelvic anatomy are contained in that report. Taylor Jefferson 06/08/2024 3:24 PM Clinical Impression and recommendations: I have reviewed the sonogram results above, combined with the patient's current clinical course, below are my impressions and any appropriate recommendations for management based on the sonographic findings. 1.  G2P0010 Estimated Date of Delivery: 08/22/24 by serial sonographic evaluations 2.  Fetal sonographic surveillance findings: a). Normal fluid volume b). Normal antepartum fetal assessment with BPP 8/8 c). Abnormal dopplers with absent end diastolic flow d). Severe fetal growth restriction with minimal weight gain.  Current weight: 541g Cardiac axis deviation and marginal cord insertion previously noted. 3.  Normal general sonographic findings Recommend hospital admission and coordination of care with Maternal-Fetal medicine. Taylor Jefferson 06/08/2024 3:39 PM    US  Fetal BPP W/O Non Stress Result Date: 06/08/2024 Table formatting from the original result was not included. Images from the original result were not included.  ..an [employer] of Ultrasound Medicine Technical sales engineer) accredited practice Center for [facility] [address] [place] [address] Ordering Provider: Aki Burdin, DO FOLLOW UP SONOGRAM Taylor Jefferson is in the office for a follow up sonogram for EFW,BPP and cord dopplers. She is a 23 y.o.  year old G2P0010 with Estimated Date of Delivery: 08/22/24 by LMP now at  [redacted]w[redacted]d weeks gestation. Thus far the pregnancy has been complicated by [facility]. GESTATION: SINGLETON PRESENTATION: breech frank FETAL ACTIVITY:          Heart rate         143          The fetus is active. AMNIOTIC FLUID: The amniotic fluid volume is  normal, 10 cm. PLACENTA LOCALIZATION:  fundal, posterior  small thick placenta GRADE 0 CERVIX: Limited view GESTATIONAL AGE AND  BIOMETRICS: Gestational criteria: Estimated Date of Delivery: 08/22/24 by LMP now at [redacted]w[redacted]d Previous Scans:10          BIPARIETAL DIAMETER           6.08 cm         24+5 weeks HEAD CIRCUMFERENCE           23 cm         25 weeks ABDOMINAL CIRCUMFERENCE           17.56 cm  22+3 weeks FEMUR LENGTH           3.92 cm         22+4 weeks                                                       AVERAGE EGA(BY THIS SCAN):  24+4 weeks                                                 ESTIMATED FETAL WEIGHT:       541  grams BIOPHYSICAL PROFILE:                                                                                                      COMMENTS GROSS BODY MOVEMENT                 2  TONE                2  RESPIRATIONS                2  AMNIOTIC FLUID                2                                                          SCORE:  8/8 (Note: NST was not performed as part of this antepartum testing)  DOPPLER FLOW STUDIES: UMBILICAL ARTERY RI RATIOS:   absent EDF ANATOMICAL SURVEY                                                                            COMMENTS CEREBRAL VENTRICLES yes normal  CHOROID PLEXUS yes normal  CEREBELLUM yes normal  CISTERNA MAGNA  Yes  normal   CAVUM SEPTI PELLUCIDI YES NORMAL  NUCHAL REGION yes normal              FACIAL PROFILE yes normal  4 CHAMBERED HEART yes abnormal Abnormal cardiac axis  OUTFLOW TRACTS YES normaL  3VV YES NORMAL  3VTV YES NORMAL  SITUS YES NORMAL      DIAPHRAGM yes normal  STOMACH yes normal  RENAL REGION yes normal   BLADDER yes normal  PLACENTA CORD INSERTION Yes   abormal  Marginal cord insertion ABDOMINAL CORD INSERTION YES  NORMAL  3 VESSEL CORD yes normal              GENITALIA   female     SUSPECTED ABNORMALITIES:  yes QUALITY OF SCAN: satisfactory TECHNICIAN COMMENTS: US  29+2 wks,frank breech,small thick posterior fundal placenta  gr 0,absent EDF,FHR 143 bpm,AFI 10 cm,BPP 8/8,abnormal cardiac axis,EFW 541 g,marginal cord insertion A copy of this report including all images has been saved and backed up to a second source for retrieval if needed. All measures and details of the anatomical scan, placentation, fluid volume and pelvic anatomy are contained in that report. Taylor Jefferson 06/08/2024 3:24 PM Clinical Impression and recommendations: I have reviewed the sonogram results above, combined with the patient's current clinical course, below are my impressions and any appropriate recommendations for management based on the sonographic findings. 1.  G2P0010 Estimated Date of Delivery: 08/22/24 by serial sonographic evaluations 2.  Fetal sonographic surveillance findings: a). Normal fluid volume b). Normal antepartum fetal assessment with BPP 8/8 c). Abnormal dopplers with absent end diastolic flow d). Severe fetal growth restriction with minimal weight gain.  Current weight: 541g Cardiac axis deviation and marginal cord insertion previously noted. 3.  Normal general sonographic findings Recommend hospital admission and coordination of care with Maternal-Fetal medicine. Taylor Jefferson 06/08/2024 3:39 PM      ASSESSMENT: G2P0010 [redacted]w[redacted]d Estimated Date of Delivery: 08/22/24  Severe fetal growth restriction with abnormal dopplers Marginal cord insertion High risk pregnancy @ [redacted]w[redacted]d Marginal cord insertion  Chronic hypertension History of syphilis (treated)  Elevated CMV IgG, IgM  Anxiety and depression Marijuana use (patient reports discontinued)   Patient Active Problem List   Diagnosis Date Noted   Exposure to  syphilis 06/08/2024   Chronic hypertension during pregnancy, antepartum 06/08/2024   Fetal growth restriction, 500-749 grams 06/08/2024   CMV: elevated IgG and IgM which is concerning for recent CMV infection: although IgM for CMV may remain elevated for months to years 05/25/2024   Marginal insertion of umbilical cord affecting management of mother in second trimester 03/29/2024   Fetal growth restriction antepartum 03/29/2024   Supervision of high risk pregnancy, antepartum 02/09/2024   Herpes 05/30/2022   Marijuana use 05/17/2021   Recurrent major depressive disorder (HCC) 05/17/2021   Unspecified mood (affective) disorder (HCC) 05/17/2021   Anxiety disorder, unspecified 05/17/2021   Intentional overdose of drug in tablet form (HCC) 05/16/2021   Mild persistent asthma without complication 02/29/2020   Chlamydia 09/27/2019   Flat feet, bilateral 10/09/2018   MDD (major depressive disorder), recurrent severe, 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   Migraine without aura and without status migrainosus, not intractable 12/24/2016   Anxiety state 01/04/2015    PLAN: 1) Fetal well being- Cat. I  Reactive NST this am, for now q shift monitoring Overnight- pt had episode of recurrent variable decels- resolved with position changes and IV fluid bolus.  Pt was kept on continuous until significant improvement and Cat I tracing noted. -pt has UA dopplers this am -s/p BMZ 8/19-20 and Mag x 12hr on admission -s/p NICU consult  2) Maternal care -Chronic HTN- continue Labetalol  100mg  daily -routine OB care -SCDs for DVT prophylaxis -regular diet- hold this am until dopplers completed -regular activity  DISPO: Continue inpatient monitoring as outlined above, plan for delivery pending maternal/fetal status    Taylor Jefferson 06/11/2024,6:53 AM

## 2024-06-11 NOTE — Progress Notes (Signed)
 Discussed UA doppler results with Dr. Steffan Keys (MFM); there was evidence of elevated dopplers but no AEDF or REDF. Will continue close EFM for now, especially given report of her variable decelerations overnight. BPs noted to be very elevated this morning prior to administration of prescribed Labetalol  100 mg po bid.  Her pulse was in the 50s-60s so antihypertensive medication was switched to Procardia  XL 30 mg po bid.  Will continue to monitor BP closely, and adjust regimen as needed. CBC, CMET also ordered; will follow up results and manage accordingly. No symptoms of severe preeclampsia for now.  Will continue close observation.   GLORIS HUGGER, MD, FACOG Obstetrician & Gynecologist, [facility] for [employer], [facility]

## 2024-06-11 NOTE — Progress Notes (Signed)
 Pt instructed that she can eat breakfast per Dr. Herchel, that her BP medication is being changed to Procardia , labs have been ordered, and after she eats breakfast we will put her back on EFM for her morning strip.

## 2024-06-12 ENCOUNTER — Encounter (HOSPITAL_COMMUNITY): Payer: Self-pay | Admitting: Obstetrics and Gynecology

## 2024-06-12 MED ORDER — DOXYLAMINE SUCCINATE (SLEEP) 25 MG PO TABS
50.0000 mg | ORAL_TABLET | Freq: Every evening | ORAL | Status: DC | PRN
  Administered 2024-06-12 – 2024-06-21 (×5): 50 mg via ORAL
  Filled 2024-06-12 (×6): qty 2

## 2024-06-12 MED ORDER — BUTALBITAL-APAP-CAFFEINE 50-325-40 MG PO TABS
2.0000 | ORAL_TABLET | Freq: Once | ORAL | Status: AC
  Administered 2024-06-12: 2 via ORAL
  Filled 2024-06-12: qty 2

## 2024-06-12 NOTE — Progress Notes (Signed)
 Patient ID: Taylor Jefferson, female   DOB: 08/15/2001, 23 y.o.   MRN: [id number] FACULTY PRACTICE ANTEPARTUM(COMPREHENSIVE) NOTE  Taylor Jefferson is a 23 y.o. G2P0010 with Estimated Date of Delivery: 08/22/24   By  early ultrasound [redacted]w[redacted]d  who is admitted for AEDF.    Fetal presentation is cephalic. Length of Stay:  4  Days  Date of admission:06/08/2024  Subjective:  Patient reports the fetal movement as active. Patient reports uterine contraction  activity as none. Patient reports  vaginal bleeding as none. Patient describes fluid per vagina as None.  Vitals:  Blood pressure 120/73, pulse 93, temperature 98.3 F (36.8 C), temperature source Oral, resp. rate 16, height 5' 3 (1.6 m), weight 57.9 kg, last menstrual period 11/16/2023, SpO2 100%. Vitals:   06/11/24 1511 06/11/24 1938 06/11/24 2343 06/12/24 0744  BP: 114/80 (!) 131/91 120/73   Pulse: 74 88 93 93  Resp: 18 18 16 16   Temp: 98.1 F (36.7 C) 98.1 F (36.7 C) 97.9 F (36.6 C) 98.3 F (36.8 C)  TempSrc: Oral Oral Oral Oral  SpO2: 100% 100% 100% 100%  Weight:      Height:       Physical Examination:  General appearance - alert, well appearing, and in no distress Fundal Height:  size less than dates Pelvic Exam:  not evaluated  Extremities: extremities normal, atraumatic, no cyanosis or edema with DTRs 2+ bilaterally Membranes:intact  Fetal Monitoring:  Baseline: 130s bpm, Variability: Fair (1-6 bpm), and Accelerations: Non-reactive but appropriate for gestational age   77x10  Labs:  Results for orders placed or performed during the hospital encounter of 06/08/24 (from the past 24 hours)  Comprehensive metabolic panel   Collection Time: 06/11/24  4:01 PM  Result Value Ref Range   Sodium 135 135 - 145 mmol/L   Potassium 3.8 3.5 - 5.1 mmol/L   Chloride 104 98 - 111 mmol/L   CO2 20 (L) 22 - 32 mmol/L   Glucose, Bld 63 (L) 70 - 99 mg/dL   BUN 14 6 - 20 mg/dL   Creatinine, Ser 9.30 0.44 - 1.00 mg/dL   Calcium  9.1  8.9 - 10.3 mg/dL   Total Protein 7.2 6.5 - 8.1 g/dL   Albumin 3.2 (L) 3.5 - 5.0 g/dL   AST 19 15 - 41 U/L   ALT 13 0 - 44 U/L   Alkaline Phosphatase 134 (H) 38 - 126 U/L   Total Bilirubin 0.4 0.0 - 1.2 mg/dL   GFR, Estimated >39 >39 mL/min   Anion gap 11 5 - 15  Protein / creatinine ratio, urine   Collection Time: 06/11/24  4:15 PM  Result Value Ref Range   Creatinine, Urine 95 mg/dL   Total Protein, Urine 18 mg/dL   Protein Creatinine Ratio 0.19 (H) 0.00 - 0.15 mg/mg[Cre]    Imaging Studies:    US  MFM UA CORD DOPPLER Result Date: 06/11/2024 ----------------------------------------------------------------------  OBSTETRICS REPORT                       (Signed Final 06/11/2024 09:17 am) ---------------------------------------------------------------------- Patient Info  ID #:       [id number]                          D.O.B.:  [birth year] (23 yrs)(F)  Name:       Taylor Jefferson                Visit  Date: 06/11/2024 07:42 am ---------------------------------------------------------------------- Performed By  Attending:        Steffan Keys MD         Secondary Phy.:   [facility]                                                             Care  Performed By:     Jonette Nap        [facility]  Referred By:      [facility] MAU/Triage ---------------------------------------------------------------------- Orders  #  Description                           Code        Ordered By  1  US  MFM UA CORD DOPPLER                76820.02    CHARLIE PICKENS  2  US  MFM OB LIMITED                     76815.01    CHARLIE PICKENS ----------------------------------------------------------------------  #  Order #                     Accession #                Episode #  1  [id number]                   [id number]                 [id number]  2  [id number]                   [id number]                 [id number]  ---------------------------------------------------------------------- Indications  Maternal care for known or suspected poor      O36.5930  fetal growth, third trimester, single or  unspecified fetus IUGR  Encounter for antenatal screening,             Z36.9  unspecified  [redacted] weeks gestation of pregnancy                Z3A.29  Marginal insertion of umbilical cord affecting O43.193  management of mother in third trimester  Encounter for other antenatal screening        Z36.2  follow-up ---------------------------------------------------------------------- Fetal Evaluation  Num Of Fetuses:         1  Fetal Heart Rate(bpm):  153  Cardiac Activity:       Observed  Presentation:           Breech  Placenta:               Fundal  P. Cord Insertion:      Marg insertion previously seen  Amniotic Fluid  AFI FV:  Within normal limits  AFI Sum(cm)     %Tile       Largest Pocket(cm)  10.9            20          4   RUQ(cm)       RLQ(cm)       LUQ(cm)        LLQ(cm)  1.4           3.7           4              1.8 ---------------------------------------------------------------------- Biometry  LV:        4.6  mm ---------------------------------------------------------------------- OB History  Gravidity:    2          SAB:   1 ---------------------------------------------------------------------- Gestational Age  LMP:           29w 5d        Date:  11/16/23                  EDD:   08/22/24  Best:          29w 5d     Det. By:  LMP  (11/16/23)          EDD:   08/22/24 ---------------------------------------------------------------------- Anatomy  Ventricles:            Appears normal         Stomach:                Appears normal, left                                                                        sided  Aortic Arch:           Appears normal         Kidneys:                Appear normal  Diaphragm:             Appears normal         Bladder:                Appears normal  ---------------------------------------------------------------------- Doppler - Fetal Vessels  Umbilical Artery   S/D     %tile      RI    %tile                      PSV    ADFV    RDFV                                                     (cm/s)    8.5   > 97.5     0.9   > 97.5                      39.2      No      No ---------------------------------------------------------------------- Comments  This patient has been hospitalized due to severe IUGR.  Absent end-diastolic  flow was noted on her umbilical artery  Doppler studies performed in the office earlier this week.  Her umbilical artery Doppler studies performed at the time of  admission showed an elevated S/D ratio without any signs of  absent or reversed end-diastolic flow.  She has already received a complete course of antenatal  corticosteroids and magnesium  for fetal neuroprotection.  On today's exam, there was normal amniotic fluid noted with  a total AFI of 10.9 cm.  Doppler studies of the umbilical arteries performed today  continues to show an elevated S/D ratio of 8.5.  There were  no signs of absent or reversed end-diastolic flow noted today.  The fetus was in the breech presentation.  She will continue inpatient management with Q shift  monitoring for now.  Delivery is recommended at any time for  nonreassuring fetal status. ----------------------------------------------------------------------                   Steffan Keys, MD Electronically Signed Final Report   06/11/2024 09:17 am ----------------------------------------------------------------------   US  MFM OB LIMITED Result Date: 06/11/2024 ----------------------------------------------------------------------  OBSTETRICS REPORT                       (Signed Final 06/11/2024 09:17 am) ---------------------------------------------------------------------- Patient Info  ID #:       [id number]                          D.O.B.:  [birth year] (22 yrs)(F)  Name:       Taylor Jefferson                 Visit Date: 06/11/2024 07:42 am ---------------------------------------------------------------------- Performed By  Attending:        Steffan Keys MD         Secondary Phy.:   [facility]                                                             Care  Performed By:     Jonette Nap        [facility]  Referred By:      [facility] MAU/Triage ---------------------------------------------------------------------- Orders  #  Description                           Code        Ordered By  1  US  MFM UA CORD DOPPLER                76820.02    CHARLIE PICKENS  2  US  MFM OB LIMITED                     23184.98    CHARLIE PICKENS ----------------------------------------------------------------------  #  Order #  Accession #                Episode #  1  [id number]                   [id number]                 [id number]  2  [id number]                   [id number]                 [id number] ---------------------------------------------------------------------- Indications  Maternal care for known or suspected poor      O36.5930  fetal growth, third trimester, single or  unspecified fetus IUGR  Encounter for antenatal screening,             Z36.9  unspecified  [redacted] weeks gestation of pregnancy                Z3A.29  Marginal insertion of umbilical cord affecting O43.193  management of mother in third trimester  Encounter for other antenatal screening        Z36.2  follow-up ---------------------------------------------------------------------- Fetal Evaluation  Num Of Fetuses:         1  Fetal Heart Rate(bpm):  153  Cardiac Activity:       Observed  Presentation:           Breech  Placenta:               Fundal  P. Cord Insertion:      Marg insertion previously seen  Amniotic Fluid  AFI FV:      Within normal limits  AFI Sum(cm)     %Tile       Largest Pocket(cm)  10.9            20          4   RUQ(cm)       RLQ(cm)        LUQ(cm)        LLQ(cm)  1.4           3.7           4              1.8 ---------------------------------------------------------------------- Biometry  LV:        4.6  mm ---------------------------------------------------------------------- OB History  Gravidity:    2          SAB:   1 ---------------------------------------------------------------------- Gestational Age  LMP:           29w 5d        Date:  11/16/23                  EDD:   08/22/24  Best:          29w 5d     Det. By:  LMP  (11/16/23)          EDD:   08/22/24 ---------------------------------------------------------------------- Anatomy  Ventricles:            Appears normal         Stomach:                Appears normal, left  sided  Aortic Arch:           Appears normal         Kidneys:                Appear normal  Diaphragm:             Appears normal         Bladder:                Appears normal ---------------------------------------------------------------------- Doppler - Fetal Vessels  Umbilical Artery   S/D     %tile      RI    %tile                      PSV    ADFV    RDFV                                                     (cm/s)    8.5   > 97.5     0.9   > 97.5                      39.2      No      No ---------------------------------------------------------------------- Comments  This patient has been hospitalized due to severe IUGR.  Absent end-diastolic flow was noted on her umbilical artery  Doppler studies performed in the office earlier this week.  Her umbilical artery Doppler studies performed at the time of  admission showed an elevated S/D ratio without any signs of  absent or reversed end-diastolic flow.  She has already received a complete course of antenatal  corticosteroids and magnesium  for fetal neuroprotection.  On today's exam, there was normal amniotic fluid noted with  a total AFI of 10.9 cm.  Doppler studies of the umbilical arteries performed today   continues to show an elevated S/D ratio of 8.5.  There were  no signs of absent or reversed end-diastolic flow noted today.  The fetus was in the breech presentation.  She will continue inpatient management with Q shift  monitoring for now.  Delivery is recommended at any time for  nonreassuring fetal status. ----------------------------------------------------------------------                   Steffan Keys, MD Electronically Signed Final Report   06/11/2024 09:17 am ----------------------------------------------------------------------   US  MFM UA CORD DOPPLER Result Date: 06/09/2024 ----------------------------------------------------------------------  OBSTETRICS REPORT                        (Signed Final 06/09/2024 05:15 pm) ---------------------------------------------------------------------- Patient Info  ID #:       [id number]                          D.O.B.:  July 28, 2001 (22 yrs)(F)  Name:       Taylor Jefferson                Visit Date: 06/09/2024 08:45 am ---------------------------------------------------------------------- Performed By  Attending:        Nathanel Fetters      Secondary Phy.:    [facility]  MD                                                              Care  Performed By:     Heather  Waken BS       [facility]  Referred By:      [facility] MAU/Triage ---------------------------------------------------------------------- Orders  #  Description                           Code        Ordered By  1  US  MFM UA CORD DOPPLER                76820.02    CORENTHIAN                                                       BOOKER  2  US  MFM OB COMP + 14 WK                76805.01    NATHANEL FETTERS ----------------------------------------------------------------------  #  Order #                     Accession #                 Episode #  1  [id number]                   [id number]                 [id number]  2  [id number]                   [id number]                 [id number] ---------------------------------------------------------------------- Indications  [redacted] weeks gestation of pregnancy                 Z3A.29  Encounter for antenatal screening,              Z36.9  unspecified  Maternal care for known or suspected poor       O36.5930  fetal growth, third trimester, single or  unspecified fetus IUGR  Marginal insertion of umbilical cord affecting  O43.193  management of mother in third trimester ---------------------------------------------------------------------- Fetal Evaluation  Num Of Fetuses:  1  Fetal Heart Rate(bpm):   123  Cardiac Activity:        Observed  Presentation:            Breech  Placenta:                Fundal  P. Cord Insertion:       Marginal insertion  Amniotic Fluid  AFI FV:      Within normal limits  AFI Sum(cm)     %Tile       Largest Pocket(cm)  9.4             7           3.4  RUQ(cm)       RLQ(cm)       LUQ(cm)        LLQ(cm)  1.1           3.4           2.1            2.8 ---------------------------------------------------------------------- Biometry  BPD:      61.7  mm     G. Age:  25w 0d        < 1  %    CI:        82.91   %    70 - 86                                                          FL/HC:       18.6  %    19.6 - 20.8  HC:      213.7  mm     G. Age:  23w 3d        < 1  %    HC/AC:       1.14       0.99 - 1.21  AC:      187.4  mm     G. Age:  23w 4d        < 1  %    FL/BPD:      64.3  %    71 - 87  FL:       39.7  mm     G. Age:  22w 6d        < 1  %    FL/AC:       21.2  %    20 - 24  Est. FW:     582   gm     1 lb 5 oz    < 1  % ---------------------------------------------------------------------- OB History  Gravidity:    2          SAB:   1 ---------------------------------------------------------------------- Gestational Age  LMP:           29w 3d        Date:  11/16/23                   EDD:   08/22/24  U/S Today:     23w 5d                                        EDD:  10/01/24  Best:          29w 3d     Det. By:  LMP  (11/16/23)          EDD:   08/22/24 ---------------------------------------------------------------------- Anatomy  Cranium:               Appears normal         LVOT:                   Appears normal  Cavum:                 Appears normal         Aortic Arch:            Not well visualized  Ventricles:            Appears normal         Ductal Arch:            Not well visualized  Choroid Plexus:        Appears normal         Diaphragm:              Appears normal  Cerebellum:            Appears normal         Stomach:                Appears normal, left                                                                        sided  Posterior Fossa:       Appears normal         Abdomen:                Appears normal  Nuchal Fold:           Not applicable (>20    Abdominal Wall:         Not well visualized                         wks GA)  Face:                  Not well visualized    Cord Vessels:           Appears normal (3                                                                        vessel cord)  Lips:                  Not well visualized    Kidneys:                Appear normal  Palate:                Not well visualized    Bladder:  Appears normal  Thoracic:              Appears normal         Spine:                  Ltd views no                                                                        intracranial signs of                                                                        NTD  Heart:                 Not well visualized    Upper Extremities:      Not well visualized  RVOT:                  Not well visualized    Lower Extremities:      Not well visualized  Other:  Technically difficult due to fetal position. ---------------------------------------------------------------------- Doppler - Fetal Vessels  Umbilical Artery    S/D    %tile      RI     %tile      PI    %tile     PSV    ADFV    RDFV                                                     (cm/s)    6.5   > 97.5     0.8   > 97.5     1.8   > 97.5     24.4      No      No ---------------------------------------------------------------------- Impression  Single intrauterine pregnancy here for a detailed anatomy  due to severe FGR  Normal anatomy with measurements consistent with sever  fetal growth  There is good fetal movement and amniotic fluid volume  Suboptimal views of the fetal anatomy were obtained  secondary to fetal position.  UAD are elevated without evidence of AEDF or REDF.  MFM Consultation placed. ---------------------------------------------------------------------- Recommendations  See MFM recommendations  Repeat UAD/AFI on Friday. ----------------------------------------------------------------------              Nathanel Fetters, MD Electronically Signed Final Report   06/09/2024 05:15 pm ----------------------------------------------------------------------   US  MFM OB COMP + 14 WK Result Date: 06/09/2024 ----------------------------------------------------------------------  OBSTETRICS REPORT                        (Signed Final 06/09/2024 05:15 pm) ---------------------------------------------------------------------- Patient Info  ID #:       [id number]  D.O.B.:  [birth year] (22 yrs)(F)  Name:       Taylor Jefferson                Visit Date: 06/09/2024 08:45 am ---------------------------------------------------------------------- Performed By  Attending:        Nathanel Fetters      Secondary Phy.:    [facility]                    MD                                                              Care  Performed By:     Heather  Waken BS       [facility]  Referred By:      [facility] MAU/Triage ----------------------------------------------------------------------  Orders  #  Description                           Code        Ordered By  1  US  MFM UA CORD DOPPLER                76820.02    CORENTHIAN                                                       BOOKER  2  US  MFM OB COMP + 14 WK                76805.01    NATHANEL FETTERS ----------------------------------------------------------------------  #  Order #                     Accession #                Episode #  1  [id number]                   [id number]                 [id number]  2  [id number]                   [id number]                 [id number] ---------------------------------------------------------------------- Indications  [redacted] weeks gestation of pregnancy  Z3A.29  Encounter for antenatal screening,              Z36.9  unspecified  Maternal care for known or suspected poor       O36.5930  fetal growth, third trimester, single or  unspecified fetus IUGR  Marginal insertion of umbilical cord affecting  O43.193  management of mother in third trimester ---------------------------------------------------------------------- Fetal Evaluation  Num Of Fetuses:          1  Fetal Heart Rate(bpm):   123  Cardiac Activity:        Observed  Presentation:            Breech  Placenta:                Fundal  P. Cord Insertion:       Marginal insertion  Amniotic Fluid  AFI FV:      Within normal limits  AFI Sum(cm)     %Tile       Largest Pocket(cm)  9.4             7           3.4  RUQ(cm)       RLQ(cm)       LUQ(cm)        LLQ(cm)  1.1           3.4           2.1            2.8 ---------------------------------------------------------------------- Biometry  BPD:      61.7  mm     G. Age:  25w 0d        < 1  %    CI:        82.91   %    70 - 86                                                          FL/HC:       18.6  %    19.6 - 20.8  HC:      213.7  mm     G. Age:  23w 3d        < 1  %    HC/AC:       1.14       0.99 - 1.21  AC:      187.4  mm     G. Age:  23w 4d         < 1  %    FL/BPD:      64.3  %    71 - 87  FL:       39.7  mm     G. Age:  22w 6d        < 1  %    FL/AC:       21.2  %    20 - 24  Est. FW:     582   gm     1 lb 5 oz    < 1  % ---------------------------------------------------------------------- OB History  Gravidity:    2          SAB:   1 ---------------------------------------------------------------------- Gestational Age  LMP:           29w 3d  Date:  11/16/23                  EDD:   08/22/24  U/S Today:     23w 5d                                        EDD:   10/01/24  Best:          29w 3d     Det. By:  LMP  (11/16/23)          EDD:   08/22/24 ---------------------------------------------------------------------- Anatomy  Cranium:               Appears normal         LVOT:                   Appears normal  Cavum:                 Appears normal         Aortic Arch:            Not well visualized  Ventricles:            Appears normal         Ductal Arch:            Not well visualized  Choroid Plexus:        Appears normal         Diaphragm:              Appears normal  Cerebellum:            Appears normal         Stomach:                Appears normal, left                                                                        sided  Posterior Fossa:       Appears normal         Abdomen:                Appears normal  Nuchal Fold:           Not applicable (>20    Abdominal Wall:         Not well visualized                         wks GA)  Face:                  Not well visualized    Cord Vessels:           Appears normal (3  vessel cord)  Lips:                  Not well visualized    Kidneys:                Appear normal  Palate:                Not well visualized    Bladder:                Appears normal  Thoracic:              Appears normal         Spine:                  Ltd views no                                                                        intracranial signs  of                                                                        NTD  Heart:                 Not well visualized    Upper Extremities:      Not well visualized  RVOT:                  Not well visualized    Lower Extremities:      Not well visualized  Other:  Technically difficult due to fetal position. ---------------------------------------------------------------------- Doppler - Fetal Vessels  Umbilical Artery    S/D    %tile      RI    %tile      PI    %tile     PSV    ADFV    RDFV                                                     (cm/s)    6.5   > 97.5     0.8   > 97.5     1.8   > 97.5     24.4      No      No ---------------------------------------------------------------------- Impression  Single intrauterine pregnancy here for a detailed anatomy  due to severe FGR  Normal anatomy with measurements consistent with sever  fetal growth  There is good fetal movement and amniotic fluid volume  Suboptimal views of the fetal anatomy were obtained  secondary to fetal position.  UAD are elevated without evidence of AEDF or REDF.  MFM Consultation placed. ---------------------------------------------------------------------- Recommendations  See MFM recommendations  Repeat UAD/AFI on Friday. ----------------------------------------------------------------------              Nathanel Fetters, MD Electronically Signed Final Report  06/09/2024 05:15 pm ----------------------------------------------------------------------   US  OB Follow Up Result Date: 06/08/2024 Table formatting from the original result was not included. Images from the original result were not included.  ..an [employer] Technical sales engineer) accredited practice Center for [facility] [address] [place] [address] Ordering Provider: Ozan, Jennifer, DO FOLLOW UP SONOGRAM Taylor Jefferson is in the office for a follow up sonogram for EFW,BPP and cord dopplers. She is a 23 y.o. year old  G2P0010 with Estimated Date of Delivery: 08/22/24 by LMP now at  [redacted]w[redacted]d weeks gestation. Thus far the pregnancy has been complicated by [facility]. GESTATION: SINGLETON PRESENTATION: breech frank FETAL ACTIVITY:          Heart rate         143          The fetus is active. AMNIOTIC FLUID: The amniotic fluid volume is  normal, 10 cm. PLACENTA LOCALIZATION:  fundal, posterior  small thick placenta GRADE 0 CERVIX: Limited view GESTATIONAL AGE AND  BIOMETRICS: Gestational criteria: Estimated Date of Delivery: 08/22/24 by LMP now at [redacted]w[redacted]d Previous Scans:10          BIPARIETAL DIAMETER           6.08 cm         24+5 weeks HEAD CIRCUMFERENCE           23 cm         25 weeks ABDOMINAL CIRCUMFERENCE           17.56 cm         22+3 weeks FEMUR LENGTH           3.92 cm         22+4 weeks                                                       AVERAGE EGA(BY THIS SCAN):  24+4 weeks                                                 ESTIMATED FETAL WEIGHT:       541  grams BIOPHYSICAL PROFILE:                                                                                                      COMMENTS GROSS BODY MOVEMENT                 2  TONE                2  RESPIRATIONS                2  AMNIOTIC FLUID                2  SCORE:  8/8 (Note: NST was not performed as part of this antepartum testing)  DOPPLER FLOW STUDIES: UMBILICAL ARTERY RI RATIOS:   absent EDF ANATOMICAL SURVEY                                                                            COMMENTS CEREBRAL VENTRICLES yes normal  CHOROID PLEXUS yes normal  CEREBELLUM yes normal  CISTERNA MAGNA  Yes  normal   CAVUM SEPTI PELLUCIDI YES NORMAL  NUCHAL REGION yes normal              FACIAL PROFILE yes normal  4 CHAMBERED HEART yes abnormal Abnormal cardiac axis  OUTFLOW TRACTS YES normaL  3VV YES NORMAL  3VTV YES NORMAL  SITUS YES NORMAL      DIAPHRAGM yes normal  STOMACH yes normal  RENAL REGION yes normal  BLADDER yes  normal  PLACENTA CORD INSERTION Yes   abormal  Marginal cord insertion ABDOMINAL CORD INSERTION YES NORMAL  3 VESSEL CORD yes normal              GENITALIA   female     SUSPECTED ABNORMALITIES:  yes QUALITY OF SCAN: satisfactory TECHNICIAN COMMENTS: US  29+2 wks,frank breech,small thick posterior fundal placenta  gr 0,absent EDF,FHR 143 bpm,AFI 10 cm,BPP 8/8,abnormal cardiac axis,EFW 541 g,marginal cord insertion A copy of this report including all images has been saved and backed up to a second source for retrieval if needed. All measures and details of the anatomical scan, placentation, fluid volume and pelvic anatomy are contained in that report. Amber JINNY Pitts 06/08/2024 3:24 PM Clinical Impression and recommendations: I have reviewed the sonogram results above, combined with the patient's current clinical course, below are my impressions and any appropriate recommendations for management based on the sonographic findings. 1.  G2P0010 Estimated Date of Delivery: 08/22/24 by serial sonographic evaluations 2.  Fetal sonographic surveillance findings: a). Normal fluid volume b). Normal antepartum fetal assessment with BPP 8/8 c). Abnormal dopplers with absent end diastolic flow d). Severe fetal growth restriction with minimal weight gain.  Current weight: 541g Cardiac axis deviation and marginal cord insertion previously noted. 3.  Normal general sonographic findings Recommend hospital admission and coordination of care with Maternal-Fetal medicine. Delon CHRISTELLA Prude 06/08/2024 3:39 PM    US  UA Cord Doppler Result Date: 06/08/2024 Table formatting from the original result was not included. Images from the original result were not included.  ..an [employer] Technical sales engineer) accredited practice Center for [facility] [address] [place] [address] Ordering Provider: Ozan, Jennifer, DO FOLLOW UP SONOGRAM Armonii T Jefferson is in the office for a follow up sonogram for  EFW,BPP and cord dopplers. She is a 22 y.o. year old G2P0010 with Estimated Date of Delivery: 08/22/24 by LMP now at  [redacted]w[redacted]d weeks gestation. Thus far the pregnancy has been complicated by [facility]. GESTATION: SINGLETON PRESENTATION: breech frank FETAL ACTIVITY:          Heart rate         143          The fetus is active. AMNIOTIC FLUID: The amniotic fluid volume is  normal, 10 cm. PLACENTA  LOCALIZATION:  fundal, posterior  small thick placenta GRADE 0 CERVIX: Limited view GESTATIONAL AGE AND  BIOMETRICS: Gestational criteria: Estimated Date of Delivery: 08/22/24 by LMP now at [redacted]w[redacted]d Previous Scans:10          BIPARIETAL DIAMETER           6.08 cm         24+5 weeks HEAD CIRCUMFERENCE           23 cm         25 weeks ABDOMINAL CIRCUMFERENCE           17.56 cm         22+3 weeks FEMUR LENGTH           3.92 cm         22+4 weeks                                                       AVERAGE EGA(BY THIS SCAN):  24+4 weeks                                                 ESTIMATED FETAL WEIGHT:       541  grams BIOPHYSICAL PROFILE:                                                                                                      COMMENTS GROSS BODY MOVEMENT                 2  TONE                2  RESPIRATIONS                2  AMNIOTIC FLUID                2                                                          SCORE:  8/8 (Note: NST was not performed as part of this antepartum testing)  DOPPLER FLOW STUDIES: UMBILICAL ARTERY RI RATIOS:   absent EDF ANATOMICAL SURVEY                                                                            COMMENTS CEREBRAL VENTRICLES  yes normal  CHOROID PLEXUS yes normal  CEREBELLUM yes normal  CISTERNA MAGNA  Yes  normal   CAVUM SEPTI PELLUCIDI YES NORMAL  NUCHAL REGION yes normal              FACIAL PROFILE yes normal  4 CHAMBERED HEART yes abnormal Abnormal cardiac axis  OUTFLOW TRACTS YES normaL  3VV YES NORMAL  3VTV YES NORMAL  SITUS YES NORMAL      DIAPHRAGM yes normal  STOMACH  yes normal  RENAL REGION yes normal  BLADDER yes normal  PLACENTA CORD INSERTION Yes   abormal  Marginal cord insertion ABDOMINAL CORD INSERTION YES NORMAL  3 VESSEL CORD yes normal              GENITALIA   female     SUSPECTED ABNORMALITIES:  yes QUALITY OF SCAN: satisfactory TECHNICIAN COMMENTS: US  29+2 wks,frank breech,small thick posterior fundal placenta  gr 0,absent EDF,FHR 143 bpm,AFI 10 cm,BPP 8/8,abnormal cardiac axis,EFW 541 g,marginal cord insertion A copy of this report including all images has been saved and backed up to a second source for retrieval if needed. All measures and details of the anatomical scan, placentation, fluid volume and pelvic anatomy are contained in that report. Amber JINNY Pitts 06/08/2024 3:24 PM Clinical Impression and recommendations: I have reviewed the sonogram results above, combined with the patient's current clinical course, below are my impressions and any appropriate recommendations for management based on the sonographic findings. 1.  G2P0010 Estimated Date of Delivery: 08/22/24 by serial sonographic evaluations 2.  Fetal sonographic surveillance findings: a). Normal fluid volume b). Normal antepartum fetal assessment with BPP 8/8 c). Abnormal dopplers with absent end diastolic flow d). Severe fetal growth restriction with minimal weight gain.  Current weight: 541g Cardiac axis deviation and marginal cord insertion previously noted. 3.  Normal general sonographic findings Recommend hospital admission and coordination of care with Maternal-Fetal medicine. Delon CHRISTELLA Prude 06/08/2024 3:39 PM    US  Fetal BPP W/O Non Stress Result Date: 06/08/2024 Table formatting from the original result was not included. Images from the original result were not included.  ..an [employer] Technical sales engineer) accredited practice Center for [facility] [address] [place] [address] Ordering Provider: Ozan, Jennifer, DO FOLLOW UP SONOGRAM Taylor T  Jefferson is in the office for a follow up sonogram for EFW,BPP and cord dopplers. She is a 23 y.o. year old G2P0010 with Estimated Date of Delivery: 08/22/24 by LMP now at  [redacted]w[redacted]d weeks gestation. Thus far the pregnancy has been complicated by [facility]. GESTATION: SINGLETON PRESENTATION: breech frank FETAL ACTIVITY:          Heart rate         143          The fetus is active. AMNIOTIC FLUID: The amniotic fluid volume is  normal, 10 cm. PLACENTA LOCALIZATION:  fundal, posterior  small thick placenta GRADE 0 CERVIX: Limited view GESTATIONAL AGE AND  BIOMETRICS: Gestational criteria: Estimated Date of Delivery: 08/22/24 by LMP now at [redacted]w[redacted]d Previous Scans:10          BIPARIETAL DIAMETER           6.08 cm         24+5 weeks HEAD CIRCUMFERENCE           23 cm         25 weeks ABDOMINAL CIRCUMFERENCE           17.56 cm  22+3 weeks FEMUR LENGTH           3.92 cm         22+4 weeks                                                       AVERAGE EGA(BY THIS SCAN):  24+4 weeks                                                 ESTIMATED FETAL WEIGHT:       541  grams BIOPHYSICAL PROFILE:                                                                                                      COMMENTS GROSS BODY MOVEMENT                 2  TONE                2  RESPIRATIONS                2  AMNIOTIC FLUID                2                                                          SCORE:  8/8 (Note: NST was not performed as part of this antepartum testing)  DOPPLER FLOW STUDIES: UMBILICAL ARTERY RI RATIOS:   absent EDF ANATOMICAL SURVEY                                                                            COMMENTS CEREBRAL VENTRICLES yes normal  CHOROID PLEXUS yes normal  CEREBELLUM yes normal  CISTERNA MAGNA  Yes  normal   CAVUM SEPTI PELLUCIDI YES NORMAL  NUCHAL REGION yes normal              FACIAL PROFILE yes normal  4 CHAMBERED HEART yes abnormal Abnormal cardiac axis  OUTFLOW TRACTS YES normaL  3VV YES NORMAL  3VTV YES NORMAL   SITUS YES NORMAL      DIAPHRAGM yes normal  STOMACH yes normal  RENAL REGION yes normal  BLADDER yes normal  PLACENTA CORD INSERTION Yes   abormal  Marginal cord insertion ABDOMINAL CORD INSERTION YES  NORMAL  3 VESSEL CORD yes normal              GENITALIA   female     SUSPECTED ABNORMALITIES:  yes QUALITY OF SCAN: satisfactory TECHNICIAN COMMENTS: US  29+2 wks,frank breech,small thick posterior fundal placenta  gr 0,absent EDF,FHR 143 bpm,AFI 10 cm,BPP 8/8,abnormal cardiac axis,EFW 541 g,marginal cord insertion A copy of this report including all images has been saved and backed up to a second source for retrieval if needed. All measures and details of the anatomical scan, placentation, fluid volume and pelvic anatomy are contained in that report. Amber JINNY Pitts 06/08/2024 3:24 PM Clinical Impression and recommendations: I have reviewed the sonogram results above, combined with the patient's current clinical course, below are my impressions and any appropriate recommendations for management based on the sonographic findings. 1.  G2P0010 Estimated Date of Delivery: 08/22/24 by serial sonographic evaluations 2.  Fetal sonographic surveillance findings: a). Normal fluid volume b). Normal antepartum fetal assessment with BPP 8/8 c). Abnormal dopplers with absent end diastolic flow d). Severe fetal growth restriction with minimal weight gain.  Current weight: 541g Cardiac axis deviation and marginal cord insertion previously noted. 3.  Normal general sonographic findings Recommend hospital admission and coordination of care with Maternal-Fetal medicine. Jennifer M Ozan 06/08/2024 3:39 PM      Medications:  Scheduled  aspirin  EC  81 mg Oral Daily   butalbital -acetaminophen -caffeine   2 tablet Oral Once   metroNIDAZOLE   500 mg Oral BID   NIFEdipine   30 mg Oral BID   prenatal multivitamin  1 tablet Oral Q1200   sodium chloride  flush  3 mL Intravenous Q12H   valACYclovir   500 mg Oral BID   I have reviewed the patient's  current medications.  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

## 2024-06-12 NOTE — Plan of Care (Signed)
  Problem: Education: Goal: Knowledge of General Education information will improve Description: Including pain rating scale, medication(s)/side effects and non-pharmacologic comfort measures Outcome: Progressing   Problem: Health Behavior/Discharge Planning: Goal: Ability to manage health-related needs will improve Outcome: Progressing   Problem: Clinical Measurements: Goal: Ability to maintain clinical measurements within normal limits will improve Outcome: Progressing Goal: Will remain free from infection Outcome: Progressing Goal: Diagnostic test results will improve Outcome: Progressing Goal: Respiratory complications will improve Outcome: Progressing Goal: Cardiovascular complication will be avoided Outcome: Progressing   Problem: Activity: Goal: Risk for activity intolerance will decrease Outcome: Progressing   Problem: Nutrition: Goal: Adequate nutrition will be maintained Outcome: Progressing   Problem: Coping: Goal: Level of anxiety will decrease Outcome: Progressing   Problem: Elimination: Goal: Will not experience complications related to bowel motility Outcome: Progressing Goal: Will not experience complications related to urinary retention Outcome: Progressing   Problem: Pain Managment: Goal: General experience of comfort will improve and/or be controlled Outcome: Progressing   Problem: Safety: Goal: Ability to remain free from injury will improve Outcome: Progressing   Problem: Skin Integrity: Goal: Risk for impaired skin integrity will decrease Outcome: Progressing   Problem: Education: Goal: Knowledge of disease or condition will improve Outcome: Progressing Goal: Knowledge of the prescribed therapeutic regimen will improve Outcome: Progressing   Problem: Clinical Measurements: Goal: Complications related to the disease process, condition or treatment will be avoided or minimized Outcome: Progressing

## 2024-06-13 ENCOUNTER — Encounter (HOSPITAL_COMMUNITY): Payer: Self-pay | Admitting: Obstetrics and Gynecology

## 2024-06-13 NOTE — Progress Notes (Signed)
 Patient ID: Taylor Jefferson, female   DOB: [birth year], 23 y.o.   MRN: [id number] FACULTY PRACTICE ANTEPARTUM(COMPREHENSIVE) NOTE  Taylor Jefferson is a 23 y.o. G2P0010 with Estimated Date of Delivery: 08/22/24   By  early ultrasound [redacted]w[redacted]d   who is admitted for AEDF + severe FGR <1%.    Fetal presentation is cephalic. Length of Stay:  5  Days  Date of admission:06/08/2024  Subjective:  Patient reports the fetal movement as active. Patient reports uterine contraction  activity as none. Patient reports  vaginal bleeding as none. Patient describes fluid per vagina as None.  Vitals:  Blood pressure (!) 142/86, pulse 82, temperature 97.9 F (36.6 C), temperature source Oral, resp. rate 18, height 5' 3 (1.6 m), weight 57.9 kg, last menstrual period 11/16/2023, SpO2 100%. Vitals:   06/12/24 1941 06/13/24 0000 06/13/24 0415 06/13/24 0750  BP: (!) 135/95 (P) 130/80 134/89 (!) 142/86  Pulse: 89 (P) 80 85 82  Resp: 18 (P) 18 18 18   Temp:  (P) 98 F (36.7 C) 97.9 F (36.6 C) 97.9 F (36.6 C)  TempSrc:   Oral Oral  SpO2:    100%  Weight:      Height:       Physical Examination:  General appearance - alert, well appearing, and in no distress Fundal Height:  size less than dates Pelvic Exam:  not evaluated  Extremities: extremities normal, atraumatic, no cyanosis or edema with DTRs 2+ bilaterally Membranes:intact  Fetal Monitoring:   Taylor Jefferson is at [redacted]w[redacted]d Estimated Date of Delivery: 08/22/24  NST being performed due to FGR IAEDF  Today the NST is Reactive  Fetal Monitoring:  Baseline: 140s bpm, Variability: Good {> 6 bpm), Accelerations: Reactive, and Decelerations: Absent   reactive  The accelerations are >15 bpm and more than 2 in 20 minutes  Final diagnosis:  Reactive NST  Vonn VEAR Inch, MD    Labs:  No results found for this or any previous visit (from the past 24 hours).   Imaging Studies:    US  MFM UA CORD DOPPLER Result Date:  06/11/2024 ----------------------------------------------------------------------  OBSTETRICS REPORT                       (Signed Final 06/11/2024 09:17 am) ---------------------------------------------------------------------- Patient Info  ID #:       [id number]                          D.O.B.:  [birth year] (22 yrs)(F)  Name:       Taylor Jefferson                Visit Date: 06/11/2024 07:42 am ---------------------------------------------------------------------- Performed By  Attending:        Steffan Keys MD         Secondary Phy.:   [facility]                                                             Care  Performed By:     Jonette Nap        [facility]  Referred By:      [facility] MAU/Triage ---------------------------------------------------------------------- Orders  #  Description                           Code        Ordered By  1  US  MFM UA CORD DOPPLER                76820.02    CHARLIE PICKENS  2  US  MFM OB LIMITED                     76815.01    CHARLIE PICKENS ----------------------------------------------------------------------  #  Order #                     Accession #                Episode #  1  [id number]                   [id number]                 [id number]  2  [id number]                   [id number]                 [id number] ---------------------------------------------------------------------- Indications  Maternal care for known or suspected poor      O36.5930  fetal growth, third trimester, single or  unspecified fetus IUGR  Encounter for antenatal screening,             Z36.9  unspecified  [redacted] weeks gestation of pregnancy                Z3A.29  Marginal insertion of umbilical cord affecting O43.193  management of mother in third trimester  Encounter for other antenatal screening        Z36.2  follow-up ---------------------------------------------------------------------- Fetal Evaluation  Num  Of Fetuses:         1  Fetal Heart Rate(bpm):  153  Cardiac Activity:       Observed  Presentation:           Breech  Placenta:               Fundal  P. Cord Insertion:      Marg insertion previously seen  Amniotic Fluid  AFI FV:      Within normal limits  AFI Sum(cm)     %Tile       Largest Pocket(cm)  10.9            20          4   RUQ(cm)       RLQ(cm)       LUQ(cm)        LLQ(cm)  1.4           3.7           4              1.8 ---------------------------------------------------------------------- Biometry  LV:        4.6  mm ---------------------------------------------------------------------- OB History  Gravidity:    2  SAB:   1 ---------------------------------------------------------------------- Gestational Age  LMP:           29w 5d        Date:  11/16/23                  EDD:   08/22/24  Best:          29w 5d     Det. By:  LMP  (11/16/23)          EDD:   08/22/24 ---------------------------------------------------------------------- Anatomy  Ventricles:            Appears normal         Stomach:                Appears normal, left                                                                        sided  Aortic Arch:           Appears normal         Kidneys:                Appear normal  Diaphragm:             Appears normal         Bladder:                Appears normal ---------------------------------------------------------------------- Doppler - Fetal Vessels  Umbilical Artery   S/D     %tile      RI    %tile                      PSV    ADFV    RDFV                                                     (cm/s)    8.5   > 97.5     0.9   > 97.5                      39.2      No      No ---------------------------------------------------------------------- Comments  This patient has been hospitalized due to severe IUGR.  Absent end-diastolic flow was noted on her umbilical artery  Doppler studies performed in the office earlier this week.  Her umbilical artery Doppler studies performed at the time  of  admission showed an elevated S/D ratio without any signs of  absent or reversed end-diastolic flow.  She has already received a complete course of antenatal  corticosteroids and magnesium  for fetal neuroprotection.  On today's exam, there was normal amniotic fluid noted with  a total AFI of 10.9 cm.  Doppler studies of the umbilical arteries performed today  continues to show an elevated S/D ratio of 8.5.  There were  no signs of absent or reversed end-diastolic flow noted today.  The fetus was in the breech presentation.  She will continue inpatient management with Q shift  monitoring for now.  Delivery is  recommended at any time for  nonreassuring fetal status. ----------------------------------------------------------------------                   Steffan Keys, MD Electronically Signed Final Report   06/11/2024 09:17 am ----------------------------------------------------------------------   US  MFM OB LIMITED Result Date: 06/11/2024 ----------------------------------------------------------------------  OBSTETRICS REPORT                       (Signed Final 06/11/2024 09:17 am) ---------------------------------------------------------------------- Patient Info  ID #:       [id number]                          D.O.B.:  Mar 05, 2001 (22 yrs)(F)  Name:       Taylor Jefferson                Visit Date: 06/11/2024 07:42 am ---------------------------------------------------------------------- Performed By  Attending:        Steffan Keys MD         Secondary Phy.:   [facility]                                                             Care  Performed By:     Jonette Nap        [facility]  Referred By:      [facility] MAU/Triage ---------------------------------------------------------------------- Orders  #  Description                           Code        Ordered By  1  US  MFM UA CORD DOPPLER                76820.02     CHARLIE PICKENS  2  US  MFM OB LIMITED                     [id number]    CHARLIE PICKENS ----------------------------------------------------------------------  #  Order #                     Accession #                Episode #  1  [id number]                   [id number]                 [id number]  2  [id number]                   [id number]                 [id number] ---------------------------------------------------------------------- Indications  Maternal care for known or suspected poor      O36.5930  fetal growth, third trimester, single or  unspecified fetus IUGR  Encounter for antenatal  screening,             Z36.9  unspecified  [redacted] weeks gestation of pregnancy                Z3A.29  Marginal insertion of umbilical cord affecting O43.193  management of mother in third trimester  Encounter for other antenatal screening        Z36.2  follow-up ---------------------------------------------------------------------- Fetal Evaluation  Num Of Fetuses:         1  Fetal Heart Rate(bpm):  153  Cardiac Activity:       Observed  Presentation:           Breech  Placenta:               Fundal  P. Cord Insertion:      Marg insertion previously seen  Amniotic Fluid  AFI FV:      Within normal limits  AFI Sum(cm)     %Tile       Largest Pocket(cm)  10.9            20          4   RUQ(cm)       RLQ(cm)       LUQ(cm)        LLQ(cm)  1.4           3.7           4              1.8 ---------------------------------------------------------------------- Biometry  LV:        4.6  mm ---------------------------------------------------------------------- OB History  Gravidity:    2          SAB:   1 ---------------------------------------------------------------------- Gestational Age  LMP:           29w 5d        Date:  11/16/23                  EDD:   08/22/24  Best:          29w 5d     Det. By:  LMP  (11/16/23)          EDD:   08/22/24 ---------------------------------------------------------------------- Anatomy  Ventricles:             Appears normal         Stomach:                Appears normal, left                                                                        sided  Aortic Arch:           Appears normal         Kidneys:                Appear normal  Diaphragm:             Appears normal         Bladder:                Appears normal ---------------------------------------------------------------------- Doppler - Fetal Vessels  Umbilical Artery   S/D     %tile  RI    %tile                      PSV    ADFV    RDFV                                                     (cm/s)    8.5   > 97.5     0.9   > 97.5                      39.2      No      No ---------------------------------------------------------------------- Comments  This patient has been hospitalized due to severe IUGR.  Absent end-diastolic flow was noted on her umbilical artery  Doppler studies performed in the office earlier this week.  Her umbilical artery Doppler studies performed at the time of  admission showed an elevated S/D ratio without any signs of  absent or reversed end-diastolic flow.  She has already received a complete course of antenatal  corticosteroids and magnesium  for fetal neuroprotection.  On today's exam, there was normal amniotic fluid noted with  a total AFI of 10.9 cm.  Doppler studies of the umbilical arteries performed today  continues to show an elevated S/D ratio of 8.5.  There were  no signs of absent or reversed end-diastolic flow noted today.  The fetus was in the breech presentation.  She will continue inpatient management with Q shift  monitoring for now.  Delivery is recommended at any time for  nonreassuring fetal status. ----------------------------------------------------------------------                   Steffan Keys, MD Electronically Signed Final Report   06/11/2024 09:17 am ----------------------------------------------------------------------   US  MFM UA CORD DOPPLER Result Date:  06/09/2024 ----------------------------------------------------------------------  OBSTETRICS REPORT                        (Signed Final 06/09/2024 05:15 pm) ---------------------------------------------------------------------- Patient Info  ID #:       [id number]                          D.O.B.:  [birth year] (22 yrs)(F)  Name:       Taylor Jefferson                Visit Date: 06/09/2024 08:45 am ---------------------------------------------------------------------- Performed By  Attending:        Nathanel Fetters      Secondary Phy.:    [facility]                    MD                                                              Care  Performed By:     Heather  Waken BS       [facility]  Referred By:      [facility] MAU/Triage ---------------------------------------------------------------------- Orders  #  Description                           Code        Ordered By  1  US  MFM UA CORD DOPPLER                76820.02    CORENTHIAN                                                       BOOKER  2  US  MFM OB COMP + 14 WK                76805.01    NATHANEL FETTERS ----------------------------------------------------------------------  #  Order #                     Accession #                Episode #  1  [id number]                   [id number]                 [id number]  2  [id number]                   [id number]                 [id number] ---------------------------------------------------------------------- Indications  [redacted] weeks gestation of pregnancy                 Z3A.29  Encounter for antenatal screening,              Z36.9  unspecified  Maternal care for known or suspected poor       O36.5930  fetal growth, third trimester, single or  unspecified fetus IUGR  Marginal insertion of umbilical cord affecting  O43.193  management of mother in third trimester  ---------------------------------------------------------------------- Fetal Evaluation  Num Of Fetuses:          1  Fetal Heart Rate(bpm):   123  Cardiac Activity:        Observed  Presentation:            Breech  Placenta:                Fundal  P. Cord Insertion:       Marginal insertion  Amniotic Fluid  AFI FV:      Within normal limits  AFI Sum(cm)     %Tile       Largest Pocket(cm)  9.4             7  3.4  RUQ(cm)       RLQ(cm)       LUQ(cm)        LLQ(cm)  1.1           3.4           2.1            2.8 ---------------------------------------------------------------------- Biometry  BPD:      61.7  mm     G. Age:  25w 0d        < 1  %    CI:        82.91   %    70 - 86                                                          FL/HC:       18.6  %    19.6 - 20.8  HC:      213.7  mm     G. Age:  23w 3d        < 1  %    HC/AC:       1.14       0.99 - 1.21  AC:      187.4  mm     G. Age:  23w 4d        < 1  %    FL/BPD:      64.3  %    71 - 87  FL:       39.7  mm     G. Age:  22w 6d        < 1  %    FL/AC:       21.2  %    20 - 24  Est. FW:     582   gm     1 lb 5 oz    < 1  % ---------------------------------------------------------------------- OB History  Gravidity:    2          SAB:   1 ---------------------------------------------------------------------- Gestational Age  LMP:           29w 3d        Date:  11/16/23                  EDD:   08/22/24  U/S Today:     23w 5d                                        EDD:   10/01/24  Best:          29w 3d     Det. By:  LMP  (11/16/23)          EDD:   08/22/24 ---------------------------------------------------------------------- Anatomy  Cranium:               Appears normal         LVOT:                   Appears normal  Cavum:                 Appears normal         Aortic Arch:  Not well visualized  Ventricles:            Appears normal         Ductal Arch:            Not well visualized  Choroid Plexus:        Appears normal         Diaphragm:               Appears normal  Cerebellum:            Appears normal         Stomach:                Appears normal, left                                                                        sided  Posterior Fossa:       Appears normal         Abdomen:                Appears normal  Nuchal Fold:           Not applicable (>20    Abdominal Wall:         Not well visualized                         wks GA)  Face:                  Not well visualized    Cord Vessels:           Appears normal (3                                                                        vessel cord)  Lips:                  Not well visualized    Kidneys:                Appear normal  Palate:                Not well visualized    Bladder:                Appears normal  Thoracic:              Appears normal         Spine:                  Ltd views no  intracranial signs of                                                                        NTD  Heart:                 Not well visualized    Upper Extremities:      Not well visualized  RVOT:                  Not well visualized    Lower Extremities:      Not well visualized  Other:  Technically difficult due to fetal position. ---------------------------------------------------------------------- Doppler - Fetal Vessels  Umbilical Artery    S/D    %tile      RI    %tile      PI    %tile     PSV    ADFV    RDFV                                                     (cm/s)    6.5   > 97.5     0.8   > 97.5     1.8   > 97.5     24.4      No      No ---------------------------------------------------------------------- Impression  Single intrauterine pregnancy here for a detailed anatomy  due to severe FGR  Normal anatomy with measurements consistent with sever  fetal growth  There is good fetal movement and amniotic fluid volume  Suboptimal views of the fetal anatomy were obtained  secondary to fetal position.  UAD are elevated without  evidence of AEDF or REDF.  MFM Consultation placed. ---------------------------------------------------------------------- Recommendations  See MFM recommendations  Repeat UAD/AFI on Friday. ----------------------------------------------------------------------              Nathanel Fetters, MD Electronically Signed Final Report   06/09/2024 05:15 pm ----------------------------------------------------------------------   US  MFM OB COMP + 14 WK Result Date: 06/09/2024 ----------------------------------------------------------------------  OBSTETRICS REPORT                        (Signed Final 06/09/2024 05:15 pm) ---------------------------------------------------------------------- Patient Info  ID #:       [id number]                          D.O.B.:  2001-07-24 (22 yrs)(F)  Name:       Taylor Jefferson                Visit Date: 06/09/2024 08:45 am ---------------------------------------------------------------------- Performed By  Attending:        Nathanel Fetters      Secondary Phy.:    [facility]                    MD  Care  Performed By:     Heather  Waken BS       [facility]  Referred By:      [facility] MAU/Triage ---------------------------------------------------------------------- Orders  #  Description                           Code        Ordered By  1  US  MFM UA CORD DOPPLER                76820.02    CORENTHIAN                                                       BOOKER  2  US  MFM OB COMP + 14 WK                76805.01    NATHANEL FETTERS ----------------------------------------------------------------------  #  Order #                     Accession #                Episode #  1  [id number]                   [id number]                 [id number]  2  [id number]                   [id number]                  [id number] ---------------------------------------------------------------------- Indications  [redacted] weeks gestation of pregnancy                 Z3A.29  Encounter for antenatal screening,              Z36.9  unspecified  Maternal care for known or suspected poor       O36.5930  fetal growth, third trimester, single or  unspecified fetus IUGR  Marginal insertion of umbilical cord affecting  O43.193  management of mother in third trimester ---------------------------------------------------------------------- Fetal Evaluation  Num Of Fetuses:          1  Fetal Heart Rate(bpm):   123  Cardiac Activity:        Observed  Presentation:            Breech  Placenta:                Fundal  P. Cord Insertion:       Marginal  insertion  Amniotic Fluid  AFI FV:      Within normal limits  AFI Sum(cm)     %Tile       Largest Pocket(cm)  9.4             7           3.4  RUQ(cm)       RLQ(cm)       LUQ(cm)        LLQ(cm)  1.1           3.4           2.1            2.8 ---------------------------------------------------------------------- Biometry  BPD:      61.7  mm     G. Age:  25w 0d        < 1  %    CI:        82.91   %    70 - 86                                                          FL/HC:       18.6  %    19.6 - 20.8  HC:      213.7  mm     G. Age:  23w 3d        < 1  %    HC/AC:       1.14       0.99 - 1.21  AC:      187.4  mm     G. Age:  23w 4d        < 1  %    FL/BPD:      64.3  %    71 - 87  FL:       39.7  mm     G. Age:  22w 6d        < 1  %    FL/AC:       21.2  %    20 - 24  Est. FW:     582   gm     1 lb 5 oz    < 1  % ---------------------------------------------------------------------- OB History  Gravidity:    2          SAB:   1 ---------------------------------------------------------------------- Gestational Age  LMP:           29w 3d        Date:  11/16/23                  EDD:   08/22/24  U/S Today:     23w 5d                                        EDD:   10/01/24  Best:          29w 3d     Det. By:   LMP  (11/16/23)          EDD:   08/22/24 ---------------------------------------------------------------------- Anatomy  Cranium:               Appears normal  LVOT:                   Appears normal  Cavum:                 Appears normal         Aortic Arch:            Not well visualized  Ventricles:            Appears normal         Ductal Arch:            Not well visualized  Choroid Plexus:        Appears normal         Diaphragm:              Appears normal  Cerebellum:            Appears normal         Stomach:                Appears normal, left                                                                        sided  Posterior Fossa:       Appears normal         Abdomen:                Appears normal  Nuchal Fold:           Not applicable (>20    Abdominal Wall:         Not well visualized                         wks GA)  Face:                  Not well visualized    Cord Vessels:           Appears normal (3                                                                        vessel cord)  Lips:                  Not well visualized    Kidneys:                Appear normal  Palate:                Not well visualized    Bladder:                Appears normal  Thoracic:              Appears normal         Spine:                  Ltd views no  intracranial signs of                                                                        NTD  Heart:                 Not well visualized    Upper Extremities:      Not well visualized  RVOT:                  Not well visualized    Lower Extremities:      Not well visualized  Other:  Technically difficult due to fetal position. ---------------------------------------------------------------------- Doppler - Fetal Vessels  Umbilical Artery    S/D    %tile      RI    %tile      PI    %tile     PSV    ADFV    RDFV                                                     (cm/s)    6.5   > 97.5      0.8   > 97.5     1.8   > 97.5     24.4      No      No ---------------------------------------------------------------------- Impression  Single intrauterine pregnancy here for a detailed anatomy  due to severe FGR  Normal anatomy with measurements consistent with sever  fetal growth  There is good fetal movement and amniotic fluid volume  Suboptimal views of the fetal anatomy were obtained  secondary to fetal position.  UAD are elevated without evidence of AEDF or REDF.  MFM Consultation placed. ---------------------------------------------------------------------- Recommendations  See MFM recommendations  Repeat UAD/AFI on Friday. ----------------------------------------------------------------------              Nathanel Fetters, MD Electronically Signed Final Report   06/09/2024 05:15 pm ----------------------------------------------------------------------     Medications:  Scheduled  aspirin  EC  81 mg Oral Daily   metroNIDAZOLE   500 mg Oral BID   NIFEdipine   30 mg Oral BID   prenatal multivitamin  1 tablet Oral Q1200   sodium chloride  flush  3 mL Intravenous Q12H   valACYclovir   500 mg Oral BID   I have reviewed the patient's current medications.  ASSESSMENT: >G2P0010 [redacted]w[redacted]d Estimated Date of Delivery: 08/22/24  >FGR, severe, with elevated to intermittently absent EDF >Small placenta >Possible acute CMV infection during pregnancy, no other stigmata, IgM can be elevated for months to years >gHTN  PLAN: No changes in care plan >S/P BMZ >S/P magnesium  neuroprotection >S/P NICU consult >twice daily monitoring + twice weekly UAD/AFI/BPP >continue ASA + Procardia  xl 30 BID  Repeat EFW in a couple of weeks to assess interval growth indication for delivery if no other indication first  Vonn VEAR Inch 06/13/2024,9:10 AM     Patient ID: Taylor Jefferson, female   DOB: Jan 29, 2001, 23 y.o.   MRN: [id number]

## 2024-06-13 NOTE — Plan of Care (Signed)
  Problem: Education: Goal: Knowledge of General Education information will improve Description: Including pain rating scale, medication(s)/side effects and non-pharmacologic comfort measures Outcome: Progressing   Problem: Health Behavior/Discharge Planning: Goal: Ability to manage health-related needs will improve Outcome: Progressing   Problem: Clinical Measurements: Goal: Ability to maintain clinical measurements within normal limits will improve Outcome: Progressing Goal: Will remain free from infection Outcome: Progressing Goal: Diagnostic test results will improve Outcome: Progressing Goal: Respiratory complications will improve Outcome: Progressing Goal: Cardiovascular complication will be avoided Outcome: Progressing   Problem: Activity: Goal: Risk for activity intolerance will decrease Outcome: Progressing   Problem: Nutrition: Goal: Adequate nutrition will be maintained Outcome: Progressing   Problem: Coping: Goal: Level of anxiety will decrease Outcome: Progressing   Problem: Elimination: Goal: Will not experience complications related to bowel motility Outcome: Progressing Goal: Will not experience complications related to urinary retention Outcome: Progressing   Problem: Pain Managment: Goal: General experience of comfort will improve and/or be controlled Outcome: Progressing   Problem: Safety: Goal: Ability to remain free from injury will improve Outcome: Progressing   Problem: Skin Integrity: Goal: Risk for impaired skin integrity will decrease Outcome: Progressing   Problem: Education: Goal: Knowledge of disease or condition will improve Outcome: Progressing Goal: Knowledge of the prescribed therapeutic regimen will improve Outcome: Progressing   Problem: Clinical Measurements: Goal: Complications related to the disease process, condition or treatment will be avoided or minimized Outcome: Progressing

## 2024-06-14 ENCOUNTER — Inpatient Hospital Stay (HOSPITAL_BASED_OUTPATIENT_CLINIC_OR_DEPARTMENT_OTHER)

## 2024-06-14 DIAGNOSIS — Z3A30 30 weeks gestation of pregnancy: Secondary | ICD-10-CM | POA: Diagnosis not present

## 2024-06-14 DIAGNOSIS — O99119 Other diseases of the blood and blood-forming organs and certain disorders involving the immune mechanism complicating pregnancy, unspecified trimester: Secondary | ICD-10-CM | POA: Diagnosis not present

## 2024-06-14 DIAGNOSIS — O43193 Other malformation of placenta, third trimester: Secondary | ICD-10-CM

## 2024-06-14 DIAGNOSIS — O365930 Maternal care for other known or suspected poor fetal growth, third trimester, not applicable or unspecified: Secondary | ICD-10-CM

## 2024-06-14 DIAGNOSIS — R7401 Elevation of levels of liver transaminase levels: Secondary | ICD-10-CM | POA: Diagnosis not present

## 2024-06-14 LAB — CBC
HCT: 37.5 % (ref 36.0–46.0)
HCT: 38.3 % (ref 36.0–46.0)
Hemoglobin: 12.8 g/dL (ref 12.0–15.0)
Hemoglobin: 13.2 g/dL (ref 12.0–15.0)
MCH: 30.4 pg (ref 26.0–34.0)
MCH: 30.6 pg (ref 26.0–34.0)
MCHC: 34.1 g/dL (ref 30.0–36.0)
MCHC: 34.5 g/dL (ref 30.0–36.0)
MCV: 88.9 fL (ref 80.0–100.0)
MCV: 89.1 fL (ref 80.0–100.0)
Platelets: 105 K/uL — ABNORMAL LOW (ref 150–400)
Platelets: 111 K/uL — ABNORMAL LOW (ref 150–400)
RBC: 4.21 MIL/uL (ref 3.87–5.11)
RBC: 4.31 MIL/uL (ref 3.87–5.11)
RDW: 12.2 % (ref 11.5–15.5)
RDW: 12.3 % (ref 11.5–15.5)
WBC: 7.6 K/uL (ref 4.0–10.5)
WBC: 9.3 K/uL (ref 4.0–10.5)
nRBC: 0 % (ref 0.0–0.2)
nRBC: 0 % (ref 0.0–0.2)

## 2024-06-14 LAB — COMPREHENSIVE METABOLIC PANEL WITH GFR
ALT: 53 U/L — ABNORMAL HIGH (ref 0–44)
ALT: 50 U/L — ABNORMAL HIGH (ref 0–44)
AST: 31 U/L (ref 15–41)
AST: 30 U/L (ref 15–41)
Albumin: 2.9 g/dL — ABNORMAL LOW (ref 3.5–5.0)
Albumin: 3 g/dL — ABNORMAL LOW (ref 3.5–5.0)
Alkaline Phosphatase: 144 U/L — ABNORMAL HIGH (ref 38–126)
Alkaline Phosphatase: 134 U/L — ABNORMAL HIGH (ref 38–126)
Anion gap: 10 (ref 5–15)
Anion gap: 8 (ref 5–15)
BUN: 11 mg/dL (ref 6–20)
BUN: 12 mg/dL (ref 6–20)
CO2: 19 mmol/L — ABNORMAL LOW (ref 22–32)
CO2: 22 mmol/L (ref 22–32)
Calcium: 9 mg/dL (ref 8.9–10.3)
Calcium: 9.1 mg/dL (ref 8.9–10.3)
Chloride: 103 mmol/L (ref 98–111)
Chloride: 103 mmol/L (ref 98–111)
Creatinine, Ser: 0.45 mg/dL (ref 0.44–1.00)
Creatinine, Ser: 0.61 mg/dL (ref 0.44–1.00)
GFR, Estimated: >60 mL/min (ref >60)
GFR, Estimated: >60 mL/min (ref >60)
Glucose, Bld: 85 mg/dL (ref 70–99)
Glucose, Bld: 73 mg/dL (ref 70–99)
Potassium: 4.2 mmol/L (ref 3.5–5.1)
Potassium: 4.4 mmol/L (ref 3.5–5.1)
Sodium: 132 mmol/L — ABNORMAL LOW (ref 135–145)
Sodium: 133 mmol/L — ABNORMAL LOW (ref 135–145)
Total Bilirubin: 0.4 mg/dL (ref 0.0–1.2)
Total Bilirubin: 0.4 mg/dL (ref 0.0–1.2)
Total Protein: 6.9 g/dL (ref 6.5–8.1)
Total Protein: 6.8 g/dL (ref 6.5–8.1)

## 2024-06-14 LAB — TYPE AND SCREEN
ABO/RH(D): B POS
Antibody Screen: NEGATIVE

## 2024-06-14 MED ORDER — ONDANSETRON 4 MG PO TBDP
4.0000 mg | ORAL_TABLET | Freq: Three times a day (TID) | ORAL | Status: DC | PRN
  Administered 2024-06-14 – 2024-06-24 (×5): 4 mg via ORAL
  Filled 2024-06-14 (×5): qty 1

## 2024-06-14 NOTE — Progress Notes (Addendum)
 Chaplain responded to [place] consult for Advance Directives, in addition to providing AD education (see other note), chaplain completed spiritual assessment. Chaplain asked open ended questions to facilitate emotional expression and story telling.   Malayla shared that she is expecting she will be at the hospital for 2-4 more weeks depending on her baby's growth. She shared that she experienced and early pregnancy loss about two years ago, which has made her anxiety around her baby's possible early delivery higher.   Chaplain utilized reflective listening to identify sources of strength and coping strategies as well as challenges. Meriem and FOB are no longer involved, which she recognizes has led to an improvement in her coping and mental health. Her primary support is her mother. In addition to losing a pregnancy, her father died within the last couple of years. Chaplain normalized feelings of grief and worry and explored strategies for self care.  Latesa is open to continued visits. Will continue to follow.  Please page as further needs arise.  Alan HERO. Davee Lomax, M.Div. [facility] Chaplain Pager [phone] Office [phone]

## 2024-06-14 NOTE — Progress Notes (Signed)
 OB Note Labs stable. Plan repeat in AM. D/w pt to be NPO after MN due to if worsening labs, most likely will recommend delivery. D/w Dr. Ileana. He also recommends re-Mg 4h pre delivery if possible when delivery is needed  Bebe Izell Raddle MD Attending Center for [employer] (Faculty Practice) 06/14/2024 Time: [id number]

## 2024-06-14 NOTE — Progress Notes (Signed)
 Chaplain responded to a consult request for Advance Directive education. Chaplain introduced spiritual care and offered support.   Chaplain provided the Advance Directive packet as well as education on Advance Directives-documents an individual completes to communicate their health care directions in advance of a time when they may need them. Chaplain informed pt the documents which may be completed here in the hospital are the Living Will and Health Care Power of [place].   Chaplain informed that the Health Care Power of [place] is a legal document in which an individual names another person, their Health Care Agent, to make health care decisions when the individual is not able to make them for themselves. The Health Care Agent's function can be temporary or permanent depending on the pt's ability to make and communicate those decisions independently. Chaplain informed pt in the absence of a Health Care Power of Attorney, the state of [place]  directs health care providers to look to the following individuals in the order listed: legal guardian; an attorney?in?fact under a general power of attorney (POA) if that POA includes the right to make health care decisions; a husband or wife; a majority of parents and adult children; a majority of adult brothers and sisters; or an individual who has an established relationship with you, who is acting in good faith and who can convey your wishes.  If none of these person are available or willing to make medical decisions on a patient's behalf, the law allows the patient's doctor to make decisions for them as long as another doctor agrees with those decisions.  Chaplain also informed the patient that the Health Care agent has no decision-making authority over any affairs other than those related to his or her medical care.   The chaplain further educated the pt that a Living Will is a legal document that allows an individual to state his or her desire not to  receive life-prolonging measures in the event that they have a condition that is incurable and will result in their death in a short period of time; they are unconscious, and doctors are confident that they will not regain consciousness; and/or they have advanced dementia or other substantial and irreversible loss of mental function. The chaplain informed pt that life-prolonging measures are medical treatments that would only serve to postpone death, including breathing machines, kidney dialysis, antibiotics, artificial nutrition and hydration (tube feeding), and similar forms of treatment and that if an individual is able to express their wishes, they may also make them known without the use of a Living Will, but in the event that an individual is not able to express their wishes themselves, a Living Will allows medical providers and the pt's family and friends ensure that they are not making decisions on the pt's behalf, but rather serving as the pt's voice to convey decisions the pt has already made.   The patient is aware that the decision to create an advance directive is theirs alone and they may chose not to complete the documents or may chose to complete one portion or both.  The patient was informed that they can revoke the documents at any time by striking through them and writing void or by completing new documents, but that it is also advisable that the individual verbally notify interested parties that their wishes have changed.  They are also aware that the document must be signed in the presence of a notary public and two witnesses and that this can be done while the patient  is still admitted to the hospital or after discharge in the community. If they decide to complete Advance Directives after being discharged from the hospital, they have been advised to notify all interested parties and to provide those documents to their physicians and loved ones in addition to bringing them to the hospital in  the event of another hospitalization.   The chaplain informed the pt that if they desire to proceed with completing Advance Directive Documentation while they are still admitted, notary services are typically available at [facility] between the hours of 1:00 and 3:30 Monday-Thursday.    When the patient is ready to have these documents completed, the patient should request that their nurse place a spiritual care consult and indicate that the patient is ready to have their advance directives notarized so that arrangements for witnesses and notary public can be made.  Please page spiritual care if the patient desires further education or has questions.     Please page as further needs arise.  Alan HERO. Davee Lomax, M.Div. [facility] Chaplain Pager [phone] Office [phone]

## 2024-06-14 NOTE — Progress Notes (Signed)
 Daily Antepartum Note  Admission Date: 06/08/2024 Current Date: 06/14/2024 9:55 AM  Taylor Jefferson is a 23 y.o. G2P0010 at [redacted]w[redacted]d, admitted for poor interval fetal growth.  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: although IgM for CMV may remain elevated for months to years 05/25/2024   Marginal insertion of umbilical cord affecting management of mother in second trimester 03/29/2024   Fetal growth restriction antepartum 03/29/2024   Supervision of high risk pregnancy, antepartum 02/09/2024   Herpes 05/30/2022   Marijuana use 05/17/2021   Recurrent major depressive disorder (HCC) 05/17/2021   Unspecified mood (affective) disorder (HCC) 05/17/2021   Anxiety disorder, unspecified 05/17/2021   Intentional overdose of drug in tablet form (HCC) 05/16/2021   Mild persistent asthma without complication 02/29/2020   Chlamydia 09/27/2019   Flat feet, bilateral 10/09/2018   MDD (major depressive disorder), recurrent severe, 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   Migraine without aura and without status migrainosus, not intractable 12/24/2016   Anxiety state 01/04/2015    Overnight/24hr events:  none  Subjective:  No complaints  Objective:    Current Vital Signs 24h Vital Sign Ranges  T 98.2 F (36.8 C) Temp  Avg: 98.3 F (36.8 C)  Min: 98 F (36.7 C)  Max: 98.6 F (37 C)  BP (!) 135/99 BP  Min: 110/69  Max: 135/99  HR 90 Pulse  Avg: 88.8  Min: 75  Max: 97  RR 18 Resp  Avg: 17.3  Min: 16  Max: 18  SaO2 100 % Room Air SpO2  Avg: 100 %  Min: 100 %  Max: 100 %       24 Hour I/O Current Shift I/O  Time Ins Outs No intake/output data recorded. No intake/output data recorded.   Patient Vitals for the past 24 hrs:  BP Temp  Temp src Pulse Resp SpO2  06/14/24 0810 (!) 135/99 98.2 F (36.8 C) Oral 90 18 100 %  06/14/24 0409 117/69 98 F (36.7 C) Oral 75 18 --  06/13/24 2308 124/78 98.2 F (36.8 C) Oral 93 18 --  06/13/24 1940 (!) 128/90 98 F (36.7 C) Oral 88 18 --  06/13/24 1633 119/75 98.5 F (36.9 C) Oral 90 16 100 %  06/13/24 1213 110/69 98.6 F (37 C) Oral 97 16 100 %    Fetal Heart Tones: 145 baseline, +accels, no decel, mod variability Tocometry: quiet x 36m  Physical exam: General: Well nourished, well developed female in no acute distress. Abdomen: gravid nttp Respiratory: no respiratory distress Skin: Warm and dry.   Medications: Current Facility-Administered Medications  Medication Dose Route Frequency Provider Last Rate Last Admin   acetaminophen  (TYLENOL ) tablet 650 mg  650 mg Oral Q4H PRN Izell Harari, MD   650 mg at 06/12/24 2245   aspirin  EC tablet 81 mg  81 mg Oral Daily Izell Harari, MD   81 mg at 06/13/24 9045   calcium  carbonate (TUMS - dosed in mg elemental calcium ) chewable tablet 200-400 mg of elemental calcium   1-2 tablet Oral BID PRN Ozan, Jennifer, DO       doxylamine  (Sleep) (UNISOM ) tablet 50 mg  50 mg Oral QHS PRN Forsyth, Kymberly C, MD   50 mg at 06/12/24 2244   famotidine  (PEPCID ) tablet 20 mg  20 mg Oral BID PRN Ozan, Jennifer, DO   20 mg at 06/10/24 2036   metroNIDAZOLE  (FLAGYL ) tablet 500 mg  500 mg Oral BID Anyanwu, Ugonna A, MD   500 mg at 06/13/24 2226   NIFEdipine  (PROCARDIA -XL/NIFEDICAL-XL) 24 hr tablet 30 mg  30 mg Oral BID Anyanwu, Ugonna A, MD   30 mg at 06/14/24 9187   prenatal multivitamin tablet 1 tablet  1 tablet Oral Q1200 Izell Harari, MD   1 tablet at 06/13/24 1139   sodium chloride  flush (NS) 0.9 % injection 3 mL  3 mL Intravenous Q12H Anyanwu, Ugonna A, MD   3 mL at 06/13/24 2226   sodium chloride  flush (NS) 0.9 % injection 3 mL  3 mL Intravenous PRN Anyanwu, Ugonna A, MD       valACYclovir  (VALTREX ) tablet 500 mg  500 mg Oral BID Izell Harari, MD   500 mg at 06/13/24 2226   Labs:  Recent Labs  Lab 06/11/24 0043 06/11/24 0933 06/14/24 0026  WBC 7.6 8.4 9.3  HGB 11.8* 12.9 12.8  HCT 33.8* 37.6 37.5  PLT 149* 177 111*   Recent Labs  Lab 06/11/24 0933 06/11/24 1601 06/14/24 0849  NA 138 135 132*  K 4.1 3.8 4.2  CL 109 104 103  CO2 21* 20* 19*  BUN 12 14 11   CREATININE 0.65 0.69 0.45  CALCIUM  8.6* 9.1 9.0  PROT 6.8 7.2 6.9  BILITOT 0.4 0.4 0.4  ALKPHOS 107 134* 144*  ALT 14 13 53*  AST 16 19 31   GLUCOSE 79 63* 85   Radiology:  8/22: AFI 10.9, elevated UAD, breech 8/20: <1%, 582g, afi 9.4, elevated UADs, breech 8/19 at [facility]: 541g, afi 10, bpp 8/8, AEDF, frank breech 8/14: fetal echo wnl, elevated UADs 7/29 at [facility]: 442g, AEDF  Assessment & Plan:  Patient stable *Pregnancy: routine care. Reactive NST yesterday. Follow up for today.  *FGR: follow up UAD for today -s/p MFM consult -Plan of care: qshift NSTs and 2x/week dopplers, q3d labs. Delivery if any worsened antenatal testing. -patient already consented for c/s. D/w her that this would be the recommended mode of delivery when the time comes *Preterm: no current issues.  -s/p bmz on 8/19-20 and Mg x 12h on admit, NICU consult *CHTN: continue current regimen *Low platelets: ASA stopped this morning. Rpt labs at 1500 and tomorrow morning *elevated ALT: see above. Hasn't used any tylenol  since 8/23 *PPx: SCDs, OOB ad lib *FEN/GI: regular diet *Dispo: inpatient until delivery  Harari Izell Raddle MD Attending Center for [facility] (Faculty Practice) GYN Consult Phone: [phone] (M-F, [phone]) & [phone]  (Off hours, weekends, holidays)

## 2024-06-15 ENCOUNTER — Encounter: Admitting: Obstetrics & Gynecology

## 2024-06-15 ENCOUNTER — Encounter (HOSPITAL_COMMUNITY): Payer: Self-pay | Admitting: Obstetrics and Gynecology

## 2024-06-15 ENCOUNTER — Ambulatory Visit (INDEPENDENT_AMBULATORY_CARE_PROVIDER_SITE_OTHER): Admitting: Licensed Clinical Social Worker

## 2024-06-15 ENCOUNTER — Other Ambulatory Visit

## 2024-06-15 ENCOUNTER — Encounter (HOSPITAL_COMMUNITY): Payer: Self-pay | Admitting: Licensed Clinical Social Worker

## 2024-06-15 DIAGNOSIS — F331 Major depressive disorder, recurrent, moderate: Secondary | ICD-10-CM | POA: Diagnosis not present

## 2024-06-15 LAB — CBC
HCT: 39.1 % (ref 36.0–46.0)
Hemoglobin: 13.7 g/dL (ref 12.0–15.0)
MCH: 30.7 pg (ref 26.0–34.0)
MCHC: 35 g/dL (ref 30.0–36.0)
MCV: 87.7 fL (ref 80.0–100.0)
Platelets: 116 K/uL — ABNORMAL LOW (ref 150–400)
RBC: 4.46 MIL/uL (ref 3.87–5.11)
RDW: 12.3 % (ref 11.5–15.5)
WBC: 8.4 K/uL (ref 4.0–10.5)
nRBC: 0 % (ref 0.0–0.2)

## 2024-06-15 LAB — COMPREHENSIVE METABOLIC PANEL WITH GFR
ALT: 44 U/L (ref 0–44)
AST: 28 U/L (ref 15–41)
Albumin: 2.9 g/dL — ABNORMAL LOW (ref 3.5–5.0)
Alkaline Phosphatase: 141 U/L — ABNORMAL HIGH (ref 38–126)
Anion gap: 7 (ref 5–15)
BUN: 12 mg/dL (ref 6–20)
CO2: 21 mmol/L — ABNORMAL LOW (ref 22–32)
Calcium: 8.9 mg/dL (ref 8.9–10.3)
Chloride: 103 mmol/L (ref 98–111)
Creatinine, Ser: 0.62 mg/dL (ref 0.44–1.00)
GFR, Estimated: >60 mL/min (ref >60)
Glucose, Bld: 72 mg/dL (ref 70–99)
Potassium: 3.8 mmol/L (ref 3.5–5.1)
Sodium: 131 mmol/L — ABNORMAL LOW (ref 135–145)
Total Bilirubin: 0.4 mg/dL (ref 0.0–1.2)
Total Protein: 6.8 g/dL (ref 6.5–8.1)

## 2024-06-15 NOTE — Progress Notes (Signed)
 Daily Antepartum Note  Admission Date: 06/08/2024 Current Date: 06/15/2024 9:04 AM  Taylor Jefferson is a 23 y.o. G2P0010 at [redacted]w[redacted]d, admitted for poor interval fetal growth.  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: although IgM for CMV may remain elevated for months to years 05/25/2024   Marginal insertion of umbilical cord affecting management of mother in second trimester 03/29/2024   Fetal growth restriction antepartum 03/29/2024   Supervision of high risk pregnancy, antepartum 02/09/2024   Herpes 05/30/2022   Marijuana use 05/17/2021   Recurrent major depressive disorder (HCC) 05/17/2021   Unspecified mood (affective) disorder (HCC) 05/17/2021   Anxiety disorder, unspecified 05/17/2021   Intentional overdose of drug in tablet form (HCC) 05/16/2021   Mild persistent asthma without complication 02/29/2020   Chlamydia 09/27/2019   Flat feet, bilateral 10/09/2018   MDD (major depressive disorder), recurrent severe, 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   Migraine without aura and without status migrainosus, not intractable 12/24/2016   Anxiety state 01/04/2015    Overnight/24hr events:  none  Subjective:  No complaints  Objective:    Current Vital Signs 24h Vital Sign Ranges  T 98.8 F (37.1 C) Temp  Avg: 98.4 F (36.9 C)  Min: 98.1 F (36.7 C)  Max: 98.8 F (37.1 C)  BP 127/86 BP  Min: 123/97  Max: 132/85  HR 87 Pulse  Avg: 89.7  Min: 82  Max: 102  RR 16 Resp  Avg: 17  Min: 15  Max: 18  SaO2 100 % Room Air SpO2  Avg: 100 %  Min: 100 %  Max: 100 %       24 Hour I/O Current Shift I/O  Time Ins Outs No intake/output data  recorded. No intake/output data recorded.   Patient Vitals for the past 24 hrs:  BP Temp Temp src Pulse Resp SpO2  06/15/24 0810 127/86 98.8 F (37.1 C) Oral 87 16 100 %  06/15/24 0338 126/85 98.5 F (36.9 C) Oral 82 15 100 %  06/14/24 2357 129/81 98.2 F (36.8 C) Oral 82 18 100 %  06/14/24 2019 132/85 98.8 F (37.1 C) Oral 89 18 100 %  06/14/24 1547 (!) 129/94 98.1 F (36.7 C) Oral 96 18 100 %  06/14/24 1150 (!) 123/97 98.1 F (36.7 C) Oral (!) 102 17 100 %    Fetal Heart Tones: 135 baseline, +accels, no decel, mod variability Tocometry: quiet x 73m  Physical exam: General: Well nourished, well developed female in no acute distress. Abdomen: gravid nttp Respiratory: no respiratory distress Skin: Warm and dry.   Medications: Current Facility-Administered Medications  Medication Dose Route Frequency Provider Last Rate Last Admin   acetaminophen  (TYLENOL ) tablet 650 mg  650 mg Oral Q4H PRN Izell Harari, MD   650 mg at 06/12/24 2245   calcium  carbonate (TUMS - dosed in mg elemental calcium ) chewable tablet 200-400 mg of elemental calcium   1-2 tablet Oral BID PRN Ozan, Jennifer, DO       doxylamine  (Sleep) (UNISOM ) tablet 50 mg  50 mg Oral QHS PRN Forsyth, Kymberly C, MD   50 mg at 06/14/24 2338   famotidine  (PEPCID ) tablet 20 mg  20 mg Oral BID PRN Ozan, Jennifer,  DO   20 mg at 06/10/24 2036   metroNIDAZOLE  (FLAGYL ) tablet 500 mg  500 mg Oral BID Anyanwu, Ugonna A, MD   500 mg at 06/14/24 2158   NIFEdipine  (PROCARDIA -XL/NIFEDICAL-XL) 24 hr tablet 30 mg  30 mg Oral BID Anyanwu, Ugonna A, MD   30 mg at 06/15/24 9188   ondansetron  (ZOFRAN -ODT) disintegrating tablet 4 mg  4 mg Oral Q8H PRN Izell Harari, MD   4 mg at 06/15/24 0534   prenatal multivitamin tablet 1 tablet  1 tablet Oral Q1200 Izell Harari, MD   1 tablet at 06/14/24 1238   sodium chloride  flush (NS) 0.9 % injection 3 mL  3 mL Intravenous Q12H Anyanwu, Ugonna A, MD   3 mL at 06/14/24 2158   sodium chloride  flush  (NS) 0.9 % injection 3 mL  3 mL Intravenous PRN Anyanwu, Gloris LABOR, MD       valACYclovir  (VALTREX ) tablet 500 mg  500 mg Oral BID Izell Harari, MD   500 mg at 06/14/24 2158   Labs:  Recent Labs  Lab 06/14/24 0026 06/14/24 1459 06/15/24 0506  WBC 9.3 7.6 8.4  HGB 12.8 13.2 13.7  HCT 37.5 38.3 39.1  PLT 111* 105* 116*   Recent Labs  Lab 06/14/24 0849 06/14/24 1459 06/15/24 0506  NA 132* 133* 131*  K 4.2 4.4 3.8  CL 103 103 103  CO2 19* 22 21*  BUN 11 12 12   CREATININE 0.45 0.61 0.62  CALCIUM  9.0 9.1 8.9  PROT 6.9 6.8 6.8  BILITOT 0.4 0.4 0.4  ALKPHOS 144* 134* 141*  ALT 53* 50* 44  AST 31 30 28   GLUCOSE 85 73 72   Radiology:  8/25: elevated UADs, AFI 9.1, breech 8/22: AFI 10.9, elevated UAD, breech 8/20 with MFM: <1%, 582g, afi 9.4, elevated UADs, breech 8/19 at [facility]: 541g, afi 10, bpp 8/8, AEDF, frank breech 8/14: fetal echo wnl, elevated UADs 7/29 at [facility]: 442g, AEDF  Assessment & Plan:  Patient stable *Pregnancy: routine care. Reactive NST yesterday. Follow up for today.  *FGR: follow up UAD MWF for now. MFM recommends trying to do 4h of re-Mg if for delivery -s/p MFM consult -Plan of care: qshift NSTs and 2x/week dopplers, q3d labs. Delivery if any worsened antenatal testing. -patient already consented for c/s. D/w her that this would be the recommended mode of delivery when the time comes *Preterm: no current issues.  -s/p bmz on 8/19-20 and Mg x 12h on admit, NICU consult *CHTN: continue current regimen *Low platelets: Repeat labs this morning stable/improved. Repeat labs 8/28 -ASA stopped 8/25 AM. *elevated ALT: resolved. Repeat on 8/28 *PPx: SCDs, OOB ad lib *FEN/GI: regular diet *Dispo: inpatient until delivery  Harari Izell Raddle MD Attending Center for [facility] (Faculty Practice) GYN Consult Phone: [phone] (M-F, [phone]) & [phone]  (Off hours, weekends, holidays)

## 2024-06-15 NOTE — Progress Notes (Signed)
 Virtual Visit via Video Note  I connected with MAYCIE LUERA on 06/15/24 at  4:30 PM EDT by a video enabled telemedicine application and verified that I am speaking with the correct person using two identifiers.  Location: Patient: hospital Provider: office   I discussed the limitations of evaluation and management by telemedicine and the availability of in person appointments. The patient expressed understanding and agreed to proceed.     I discussed the assessment and treatment plan with the patient. The patient was provided an opportunity to ask questions and all were answered. The patient agreed with the plan and demonstrated an understanding of the instructions.   The patient was advised to call back or seek an in-person evaluation if the symptoms worsen or if the condition fails to improve as anticipated.  I provided 45 minutes of non-face-to-face time during this encounter.   [name], LCSW   THERAPIST PROGRESS NOTE  Session Time: 4:30-5:15pm  Participation Level: Active  Behavioral Response: DisheveledAlertDepressed  Type of Therapy: Individual Therapy  Treatment Goals addressed:      Goal: LTG: Reduce frequency, intensity, and duration of depression symptoms so that daily functioning is improved     Dates: Start:  10/21/23    Expected End:  04/19/24       Disciplines: Interdisciplinary, PROVIDER                    Goal: LTG: Increase coping skills to manage depression and improve ability to perform daily activities     Dates: Start:  10/21/23    Expected End:  04/19/24       Disciplines: Interdisciplinary, PROVIDER         ProgressTowards Goals: Not Progressing  Interventions: CBT and Supportive  Summary: DEIRA SHIMER is a 23 y.o. female who presents with MDD, moderate.   Suicidal/Homicidal: Nowithout intent/plan  Therapist Response: Veronia engaged well in virtual individual session with clinician. Clinician utilized supportive  counseling and CBT. Clinician processed current depressive episode that started this morning. Cherrish shared that she has been crying or sleeping today. She has been admitted to the hospital due to concerns about her baby and will remain there until he is delivered. Clinician processed trigger to today's depression and noted fear and sadness about being a single parent, not having anyone there to process these medical concerns with, and bearing the weight on her own. Clinician validated these feelings and also challenged them using CBT reality testing. Clinician encouraged Kiana to reach out to friends and family for visits while she is admitted. Clinician also discussed ways to manage her mood by talking to the hospital nurses and social workers.   Plan: Return again in 1 weeks.  Diagnosis: MDD (major depressive disorder), recurrent episode, moderate (HCC)  Collaboration of Care: Other provider involved in patient's care AEB reviewed hospital charting  Patient/Guardian was advised Release of Information must be obtained prior to any record release in order to collaborate their care with an outside provider. Patient/Guardian was advised if they have not already done so to contact the registration department to sign all necessary forms in order for us  to release information regarding their care.   Consent: Patient/Guardian gives verbal consent for treatment and assignment of benefits for services provided during this visit. Patient/Guardian expressed understanding and agreed to proceed.   Harlene JONELLE Tye, LCSW 06/15/2024

## 2024-06-16 ENCOUNTER — Inpatient Hospital Stay (HOSPITAL_COMMUNITY)

## 2024-06-16 DIAGNOSIS — Z3A30 30 weeks gestation of pregnancy: Secondary | ICD-10-CM

## 2024-06-16 DIAGNOSIS — O43193 Other malformation of placenta, third trimester: Secondary | ICD-10-CM

## 2024-06-16 DIAGNOSIS — O365930 Maternal care for other known or suspected poor fetal growth, third trimester, not applicable or unspecified: Secondary | ICD-10-CM

## 2024-06-16 MED ORDER — SERTRALINE HCL 50 MG PO TABS
25.0000 mg | ORAL_TABLET | Freq: Every day | ORAL | Status: DC
  Administered 2024-06-16 – 2024-06-24 (×9): 25 mg via ORAL
  Filled 2024-06-16 (×9): qty 1

## 2024-06-16 MED ORDER — OXYCODONE HCL 5 MG PO TABS
10.0000 mg | ORAL_TABLET | ORAL | Status: DC | PRN
  Filled 2024-06-16: qty 2

## 2024-06-16 MED ORDER — OXYCODONE HCL 5 MG PO TABS
5.0000 mg | ORAL_TABLET | ORAL | Status: DC | PRN
  Administered 2024-06-16 – 2024-06-19 (×3): 5 mg via ORAL
  Filled 2024-06-16 (×2): qty 1

## 2024-06-16 MED ORDER — LIDOCAINE 4 % EX CREA
TOPICAL_CREAM | Freq: Two times a day (BID) | CUTANEOUS | Status: DC | PRN
  Administered 2024-06-16 – 2024-06-21 (×6): 1 via TOPICAL
  Filled 2024-06-16 (×2): qty 5

## 2024-06-16 NOTE — Progress Notes (Signed)
 Nutrition Assessment: Antenatal LOS > 7 days  Nutrition Recommendations: Continue Regular Diet Patient may order snacks TID from the menu and double protein portions with meals Continue prenatal vitamins Current diet prescription will provide for increased needs of pregnancy   23 year old patient, at 58 3/[redacted] weeks gestation. Admitted with severe early onset fetal growth restriction with AEDF.  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: although IgM for CMV may remain elevated for months to years 05/25/2024   Marginal insertion of umbilical cord affecting management of mother in second trimester 03/29/2024   Fetal growth restriction antepartum 03/29/2024   Supervision of high risk pregnancy, antepartum 02/09/2024   Herpes 05/30/2022   Marijuana use 05/17/2021   Recurrent major depressive disorder (HCC) 05/17/2021   Unspecified mood (affective) disorder (HCC) 05/17/2021   Anxiety disorder, unspecified 05/17/2021   Intentional overdose of drug in tablet form (HCC) 05/16/2021   Mild persistent asthma without complication 02/29/2020   Chlamydia 09/27/2019   Flat feet, bilateral 10/09/2018   MDD (major depressive disorder), recurrent severe, 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   Migraine without aura and without status migrainosus, not intractable 12/24/2016   Aphthous ulcer 01/13/2015   Anxiety state 01/04/2015    Patient reports diet tolerance as good. Pt reports good appetite and intake.  Weight History: reports pre-pregnancy wt 110 lbs (50 kg) Pre-pregnancy BMI 19.5 kg/m2 Weight gain to date 17 lbs (7.7 kg)  Estimated needs: 2100 - 2300 Kcal/day 55 - 65 g  protein/day 1800-2000 L fluid requirements   Labs and medications reviewed   Nutrition Dx: Increased nutrient needs related to pregnancy and fetal growth requirements as evidenced by 30 3/[redacted] weeks gestation.  Patient is assessed at low nutritional risk. Consult Registered Dietitian if concerns arise.

## 2024-06-16 NOTE — Progress Notes (Addendum)
 Patient ID: Taylor Jefferson, female   DOB: 2000/12/19, 23 y.o.   MRN: [id number] FACULTY PRACTICE ANTEPARTUM COMPREHENSIVE PROGRESS NOTE  Taylor Jefferson is a 23 y.o. G2P0010 at [redacted]w[redacted]d who is admitted for severe early onset fetal growth restriction with absent end-diastolic blood flow.   Estimated Date of Delivery: 08/22/24 Fetal presentation is breech.  Length of Stay:  8 Days. Admitted 06/08/2024  Subjective: Patient does not have any complaints this morning. Patient reports good fetal movement.  She reports no uterine contractions, no bleeding and no loss of fluid per vagina. Patient with a history of depression and anxiety previously on Lexapro . Patient admits to feeling depressed being here in the hospital and desires to be started on medication.  Vitals:  Blood pressure 109/69, pulse 77, temperature 98.1 F (36.7 C), temperature source Oral, resp. rate 16, height 5' 3 (1.6 m), weight 57.9 kg, last menstrual period 11/16/2023, SpO2 100%. Physical Examination: CONSTITUTIONAL: Well-developed, well-nourished female in no acute distress.  NEUROLOGIC: Alert and oriented to person, place, and time. No cranial nerve deficit noted. PSYCHIATRIC: Normal mood and affect. Normal behavior. Normal judgment and thought content. CARDIOVASCULAR: Normal heart rate noted, regular rhythm RESPIRATORY: Effort and breath sounds normal, no problems with respiration noted MUSCULOSKELETAL: Normal range of motion. No edema and no tenderness. 2+ distal pulses. ABDOMEN: Soft, nontender, nondistended, gravid. CERVIX:  Deferred  Fetal monitoring: FHR: 145 bpm, Variability: moderate, Accelerations: 15 x 15 Present, Decelerations: occasional variable Uterine activity: No contractions  Results for orders placed or performed during the hospital encounter of 06/08/24 (from the past 48 hours)  Comprehensive metabolic panel     Status: Abnormal   Collection Time: 06/14/24  8:49 AM  Result Value Ref Range   Sodium 132  (L) 135 - 145 mmol/L   Potassium 4.2 3.5 - 5.1 mmol/L   Chloride 103 98 - 111 mmol/L   CO2 19 (L) 22 - 32 mmol/L   Glucose, Bld 85 70 - 99 mg/dL    Comment: Glucose reference range applies only to samples taken after fasting for at least 8 hours.   BUN 11 6 - 20 mg/dL   Creatinine, Ser 9.54 0.44 - 1.00 mg/dL   Calcium  9.0 8.9 - 10.3 mg/dL   Total Protein 6.9 6.5 - 8.1 g/dL   Albumin 2.9 (L) 3.5 - 5.0 g/dL   AST 31 15 - 41 U/L   ALT 53 (H) 0 - 44 U/L   Alkaline Phosphatase 144 (H) 38 - 126 U/L   Total Bilirubin 0.4 0.0 - 1.2 mg/dL   GFR, Estimated >39 >39 mL/min    Comment: (NOTE) Calculated using the CKD-EPI Creatinine Equation (2021)    Anion gap 10 5 - 15    Comment: Performed at [facility], [address], [place], [place] [address]  CBC     Status: Abnormal   Collection Time: 06/14/24  2:59 PM  Result Value Ref Range   WBC 7.6 4.0 - 10.5 K/uL   RBC 4.31 3.87 - 5.11 MIL/uL   Hemoglobin 13.2 12.0 - 15.0 g/dL   HCT 61.6 63.9 - 53.9 %   MCV 88.9 80.0 - 100.0 fL   MCH 30.6 26.0 - 34.0 pg   MCHC 34.5 30.0 - 36.0 g/dL   RDW 87.7 88.4 - 84.4 %   Platelets 105 (L) 150 - 400 K/uL   nRBC 0.0 0.0 - 0.2 %    Comment: Performed at [facility], [address], [place],  [place] [address]  Comprehensive metabolic panel     Status: Abnormal   Collection Time: 06/14/24  2:59 PM  Result Value Ref Range   Sodium 133 (L) 135 - 145 mmol/L   Potassium 4.4 3.5 - 5.1 mmol/L   Chloride 103 98 - 111 mmol/L   CO2 22 22 - 32 mmol/L   Glucose, Bld 73 70 - 99 mg/dL    Comment: Glucose reference range applies only to samples taken after fasting for at least 8 hours.   BUN 12 6 - 20 mg/dL   Creatinine, Ser 9.38 0.44 - 1.00 mg/dL   Calcium  9.1 8.9 - 10.3 mg/dL   Total Protein 6.8 6.5 - 8.1 g/dL   Albumin 3.0 (L) 3.5 - 5.0 g/dL   AST 30 15 - 41 U/L   ALT 50 (H) 0 - 44 U/L   Alkaline Phosphatase 134 (H) 38 - 126 U/L   Total Bilirubin 0.4 0.0 - 1.2 mg/dL   GFR, Estimated >39 >39  mL/min    Comment: (NOTE) Calculated using the CKD-EPI Creatinine Equation (2021)    Anion gap 8 5 - 15    Comment: Performed at [facility], [address], [place], [place] [address]  CBC     Status: Abnormal   Collection Time: 06/15/24  5:06 AM  Result Value Ref Range   WBC 8.4 4.0 - 10.5 K/uL   RBC 4.46 3.87 - 5.11 MIL/uL   Hemoglobin 13.7 12.0 - 15.0 g/dL   HCT 60.8 63.9 - 53.9 %   MCV 87.7 80.0 - 100.0 fL   MCH 30.7 26.0 - 34.0 pg   MCHC 35.0 30.0 - 36.0 g/dL   RDW 87.6 88.4 - 84.4 %   Platelets 116 (L) 150 - 400 K/uL   nRBC 0.0 0.0 - 0.2 %    Comment: Performed at [facility], [address] [place], [place] [address]  Comprehensive metabolic panel     Status: Abnormal   Collection Time: 06/15/24  5:06 AM  Result Value Ref Range   Sodium 131 (L) 135 - 145 mmol/L   Potassium 3.8 3.5 - 5.1 mmol/L   Chloride 103 98 - 111 mmol/L   CO2 21 (L) 22 - 32 mmol/L   Glucose, Bld 72 70 - 99 mg/dL    Comment: Glucose reference range applies only to samples taken after fasting for at least 8 hours.   BUN 12 6 - 20 mg/dL   Creatinine, Ser 9.37 0.44 - 1.00 mg/dL   Calcium  8.9 8.9 - 10.3 mg/dL   Total Protein 6.8 6.5 - 8.1 g/dL   Albumin 2.9 (L) 3.5 - 5.0 g/dL   AST 28 15 - 41 U/L   ALT 44 0 - 44 U/L   Alkaline Phosphatase 141 (H) 38 - 126 U/L   Total Bilirubin 0.4 0.0 - 1.2 mg/dL   GFR, Estimated >39 >39 mL/min    Comment: (NOTE) Calculated using the CKD-EPI Creatinine Equation (2021)    Anion gap 7 5 - 15    Comment: Performed at [facility], [address], [place], [place] [address]    US  MFM UA CORD DOPPLER Result Date: 06/14/2024 ----------------------------------------------------------------------  OBSTETRICS REPORT                       (Signed Final 06/14/2024 10:27 am) ---------------------------------------------------------------------- Patient Info  ID #:       [id number]  D.O.B.:  2001-10-05 (22 yrs)(F)  Name:       Taylor Jefferson                Visit Date: 06/14/2024 09:28 am ---------------------------------------------------------------------- Performed By  Attending:        Fredia Fresh MD        Secondary Phy.:   [facility]                                                             Care  Performed By:     Heather  Waken,         [facility]  Referred By:      [facility] MAU/Triage ---------------------------------------------------------------------- Orders  #  Description                           Code        Ordered By  1  US  MFM UA CORD DOPPLER                76820.02    UGONNA ANYANWU  2  US  MFM OB LIMITED                     76815.01    GLORIS HUGGER ----------------------------------------------------------------------  #  Order #                     Accession #                Episode #  1  [id number]                   [id number]                 [id number]  2  [id number]                   [id number]                 [id number] ---------------------------------------------------------------------- Indications  [redacted] weeks gestation of pregnancy                Z3A.30  Encounter for antenatal screening,             Z36.9  unspecified  Maternal care for known or suspected poor      O36.5930  fetal growth, third trimester, single or  unspecified fetus IUGR  Marginal insertion of umbilical cord affecting O43.193  management of mother in third trimester ---------------------------------------------------------------------- Fetal Evaluation  Num Of Fetuses:         1  Fetal Heart Rate(bpm):  150  Cardiac Activity:       Observed  Presentation:           Breech  Placenta:               Fundal  P.  Cord Insertion:      Marg insertion previously seen  Amniotic Fluid  AFI FV:      Within normal limits  AFI Sum(cm)     %Tile       Largest Pocket(cm)  9.1             6           2.6  RUQ(cm)       RLQ(cm)       LUQ(cm)        LLQ(cm)  2.4           2.6            2.1            2 ---------------------------------------------------------------------- OB History  Gravidity:    2          SAB:   1 ---------------------------------------------------------------------- Gestational Age  LMP:           30w 1d        Date:  11/16/23                  EDD:   08/22/24  Best:          30w 1d     Det. By:  LMP  (11/16/23)          EDD:   08/22/24 ---------------------------------------------------------------------- Anatomy  Cranium:               Appears normal         Stomach:                Appears normal, left                                                                        sided  Ventricles:            Appears normal         Kidneys:                Appear normal  Thoracic:              Appears normal         Bladder:                Appears normal  Diaphragm:             Appears normal ---------------------------------------------------------------------- Doppler - Fetal Vessels  Umbilical Artery   S/D     %tile      RI    %tile      PI    %tile     PSV    ADFV    RDFV                                                     (cm/s)    7.8   > 97.5     0.9   > 97.5     1.8   > 97.5     36.1      No      No ---------------------------------------------------------------------- Cervix  Uterus Adnexa  Cervix  Not visualized (advanced GA >24wks)  Uterus  No abnormality visualized.  Right Ovary  Not visualized.  Left Ovary  Not visualized.  Cul De Sac  No free fluid seen.  Adnexa  No abnormality visualized ---------------------------------------------------------------------- Impression  Patient was admitted with diagnosis of severe fetal growth  restriction and absent diastolic flow on umbilical artery  Doppler detected at her MDs office.  On today's ultrasound, amniotic fluid is normal good fetal  activity seen.  Breech presentation.  Umbilical artery Doppler  showed increase S/D ratio.  (No absent end-diastolic flow).  ----------------------------------------------------------------------                 Fredia Fresh, MD Electronically Signed Final Report   06/14/2024 10:27 am ----------------------------------------------------------------------   US  MFM OB LIMITED Result Date: 06/14/2024 ----------------------------------------------------------------------  OBSTETRICS REPORT                       (Signed Final 06/14/2024 10:27 am) ---------------------------------------------------------------------- Patient Info  ID #:       [id number]                          D.O.B.:  July 13, 2001 (22 yrs)(F)  Name:       Taylor Jefferson                Visit Date: 06/14/2024 09:28 am ---------------------------------------------------------------------- Performed By  Attending:        Fredia Fresh MD        Secondary Phy.:   [facility]                                                             Care  Performed By:     Heather  Waken,         Location[facility]  Referred By:      [facility] MAU/Triage ---------------------------------------------------------------------- Orders  #  Description                           Code        Ordered By  1  US  MFM UA CORD DOPPLER                76820.02    UGONNA ANYANWU  2  US  MFM OB LIMITED                     23184.98    GLORIS HUGGER ----------------------------------------------------------------------  #  Order #                     Accession #                Episode #  1  [id number]  [id number]                 [id number]  2  [id number]                   [id number]                 [id number] ---------------------------------------------------------------------- Indications  [redacted] weeks gestation of pregnancy                Z3A.30  Encounter for antenatal screening,             Z36.9  unspecified  Maternal care for known or suspected poor      O36.5930  fetal growth, third trimester, single or  unspecified  fetus IUGR  Marginal insertion of umbilical cord affecting O43.193  management of mother in third trimester ---------------------------------------------------------------------- Fetal Evaluation  Num Of Fetuses:         1  Fetal Heart Rate(bpm):  150  Cardiac Activity:       Observed  Presentation:           Breech  Placenta:               Fundal  P. Cord Insertion:      Marg insertion previously seen  Amniotic Fluid  AFI FV:      Within normal limits  AFI Sum(cm)     %Tile       Largest Pocket(cm)  9.1             6           2.6  RUQ(cm)       RLQ(cm)       LUQ(cm)        LLQ(cm)  2.4           2.6           2.1            2 ---------------------------------------------------------------------- OB History  Gravidity:    2          SAB:   1 ---------------------------------------------------------------------- Gestational Age  LMP:           30w 1d        Date:  11/16/23                  EDD:   08/22/24  Best:          30w 1d     Det. By:  LMP  (11/16/23)          EDD:   08/22/24 ---------------------------------------------------------------------- Anatomy  Cranium:               Appears normal         Stomach:                Appears normal, left                                                                        sided  Ventricles:            Appears normal         Kidneys:                Appear normal  Thoracic:              Appears normal         Bladder:                Appears normal  Diaphragm:             Appears normal ---------------------------------------------------------------------- Doppler - Fetal Vessels  Umbilical Artery   S/D     %tile      RI    %tile      PI    %tile     PSV    ADFV    RDFV                                                     (cm/s)    7.8   > 97.5     0.9   > 97.5     1.8   > 97.5     36.1      No      No ---------------------------------------------------------------------- Cervix Uterus Adnexa  Cervix  Not visualized (advanced GA >24wks)  Uterus  No abnormality visualized.   Right Ovary  Not visualized.  Left Ovary  Not visualized.  Cul De Sac  No free fluid seen.  Adnexa  No abnormality visualized ---------------------------------------------------------------------- Impression  Patient was admitted with diagnosis of severe fetal growth  restriction and absent diastolic flow on umbilical artery  Doppler detected at her MDs office.  On today's ultrasound, amniotic fluid is normal good fetal  activity seen.  Breech presentation.  Umbilical artery Doppler  showed increase S/D ratio.  (No absent end-diastolic flow). ----------------------------------------------------------------------                 Fredia Fresh, MD Electronically Signed Final Report   06/14/2024 10:27 am ----------------------------------------------------------------------    Current scheduled medications  NIFEdipine   30 mg Oral BID   prenatal multivitamin  1 tablet Oral Q1200   sodium chloride  flush  3 mL Intravenous Q12H   valACYclovir   500 mg Oral BID    I have reviewed the patient's current medications.  ASSESSMENT: Principal Problem:   Fetal growth restriction, 500-749 grams Active Problems:   Aphthous ulcer   Gestational thrombocytopenia (HCC)   Elevated ALT measurement  Absent end-diastolic flow   PLAN: - Maternal fetal status remains stable with reactive NST - Follow up dopplers today - Patient completed BMZ course 8/19-8/20 - BP stable on procardia  30 mg BID - continue following labs q 72 hours- next due tomorrow - Will start zoloft  - Continue current antepartum care - Delivery for worsening maternal/fetal status   Winton Felt, MD, FACOG Obstetrician & Gynecologist, [facility] for [employer], [facility]

## 2024-06-17 DIAGNOSIS — D696 Thrombocytopenia, unspecified: Secondary | ICD-10-CM

## 2024-06-17 DIAGNOSIS — O99891 Other specified diseases and conditions complicating pregnancy: Secondary | ICD-10-CM

## 2024-06-17 LAB — COMPREHENSIVE METABOLIC PANEL WITH GFR
ALT: 37 U/L (ref 0–44)
AST: 25 U/L (ref 15–41)
Albumin: 2.9 g/dL — ABNORMAL LOW (ref 3.5–5.0)
Alkaline Phosphatase: 145 U/L — ABNORMAL HIGH (ref 38–126)
Anion gap: 9 (ref 5–15)
BUN: 8 mg/dL (ref 6–20)
CO2: 23 mmol/L (ref 22–32)
Calcium: 9 mg/dL (ref 8.9–10.3)
Chloride: 103 mmol/L (ref 98–111)
Creatinine, Ser: 0.58 mg/dL (ref 0.44–1.00)
GFR, Estimated: >60 mL/min (ref >60)
Glucose, Bld: 77 mg/dL (ref 70–99)
Potassium: 3.9 mmol/L (ref 3.5–5.1)
Sodium: 135 mmol/L (ref 135–145)
Total Bilirubin: 0.3 mg/dL (ref 0.0–1.2)
Total Protein: 6.9 g/dL (ref 6.5–8.1)

## 2024-06-17 LAB — CBC
HCT: 38.8 % (ref 36.0–46.0)
Hemoglobin: 13.6 g/dL (ref 12.0–15.0)
MCH: 30.8 pg (ref 26.0–34.0)
MCHC: 35.1 g/dL (ref 30.0–36.0)
MCV: 88 fL (ref 80.0–100.0)
Platelets: 127 K/uL — ABNORMAL LOW (ref 150–400)
RBC: 4.41 MIL/uL (ref 3.87–5.11)
RDW: 12.5 % (ref 11.5–15.5)
WBC: 6.4 K/uL (ref 4.0–10.5)
nRBC: 0 % (ref 0.0–0.2)

## 2024-06-17 LAB — TYPE AND SCREEN
ABO/RH(D): B POS
Antibody Screen: NEGATIVE

## 2024-06-17 NOTE — Progress Notes (Signed)
 FACULTY PRACTICE ANTEPARTUM COMPREHENSIVE PROGRESS NOTE  Taylor Jefferson is a 23 y.o. G2P0010 at [redacted]w[redacted]d who is admitted for FGR (<1%) with elevated iAEDF.  Estimated Date of Delivery: 08/22/24 Fetal presentation is breech.  Length of Stay:  9 Days. Admitted 06/08/2024  Subjective: No complaints. Notes improvement in her mood since starting zoloft . Is trying to keep herself busy and get outside to help with mood, too.  Patient reports good fetal movement.  She reports no uterine contractions, no bleeding and no loss of fluid per vagina.  Vitals:  Blood pressure (!) 128/90, pulse 91, temperature 98.1 F (36.7 C), temperature source Oral, resp. rate 17, height 5' 3 (1.6 m), weight 57.9 kg, last menstrual period 11/16/2023, SpO2 100%. Physical Examination: CONSTITUTIONAL: Well-developed, well-nourished female in no acute distress.  CARDIOVASCULAR: Normal heart rate noted RESPIRATORY: Effort normal, no problems with respiration noted ABDOMEN: Soft, nontender, nondistended, gravid.  Fetal monitoring: FHR: 140 bpm, Variability: moderate, Accelerations: Present (10x10), Decelerations: Absent  Uterine activity: flat  Results for orders placed or performed during the hospital encounter of 06/08/24 (from the past 48 hours)  Type and screen [facility]     Status: None   Collection Time: 06/17/24 12:40 AM  Result Value Ref Range   ABO/RH(D) B POS    Antibody Screen NEG    Sample Expiration      06/20/2024,2359 Performed at [facility], [address], [place], [place] [address]   CBC     Status: Abnormal   Collection Time: 06/17/24  7:21 AM  Result Value Ref Range   WBC 6.4 4.0 - 10.5 K/uL   RBC 4.41 3.87 - 5.11 MIL/uL   Hemoglobin 13.6 12.0 - 15.0 g/dL   HCT 61.1 63.9 - 53.9 %   MCV 88.0 80.0 - 100.0 fL   MCH 30.8 26.0 - 34.0 pg   MCHC 35.1 30.0 - 36.0 g/dL   RDW 87.4 88.4 - 84.4 %   Platelets 127 (L) 150 - 400 K/uL   nRBC 0.0 0.0 - 0.2 %    Comment: Performed  at [facility], [address], [place], [place] [address]  Comprehensive metabolic panel     Status: Abnormal   Collection Time: 06/17/24  7:21 AM  Result Value Ref Range   Sodium 135 135 - 145 mmol/L   Potassium 3.9 3.5 - 5.1 mmol/L   Chloride 103 98 - 111 mmol/L   CO2 23 22 - 32 mmol/L   Glucose, Bld 77 70 - 99 mg/dL    Comment: Glucose reference range applies only to samples taken after fasting for at least 8 hours.   BUN 8 6 - 20 mg/dL   Creatinine, Ser 9.41 0.44 - 1.00 mg/dL   Calcium  9.0 8.9 - 10.3 mg/dL   Total Protein 6.9 6.5 - 8.1 g/dL   Albumin 2.9 (L) 3.5 - 5.0 g/dL   AST 25 15 - 41 U/L   ALT 37 0 - 44 U/L   Alkaline Phosphatase 145 (H) 38 - 126 U/L   Total Bilirubin 0.3 0.0 - 1.2 mg/dL   GFR, Estimated >39 >39 mL/min    Comment: (NOTE) Calculated using the CKD-EPI Creatinine Equation (2021)    Anion gap 9 5 - 15    Comment: Performed at [facility], [address] [place], [place] [address]    US  MFM UA CORD DOPPLER Result Date: 06/16/2024 ----------------------------------------------------------------------  OBSTETRICS REPORT                       (  Signed Final 06/16/2024 02:23 pm) ---------------------------------------------------------------------- Patient Info  ID #:       [id number]                          D.O.B.:  [birth year] (22 yrs)(F)  Name:       Taylor Jefferson                Visit Date: 06/16/2024 07:41 am ---------------------------------------------------------------------- Performed By  Attending:        Steffan Keys MD         Secondary Phy.:   [facility]                                                             Care  Performed By:     Heather  Waken BS       [facility]  Referred By:      [facility] MAU/Triage ---------------------------------------------------------------------- Orders  #  Description                           Code        Ordered By   1  US  MFM UA CORD DOPPLER                76820.02    CHARLIE PICKENS  2  US  MFM OB LIMITED                     76815.01    CHARLIE PICKENS ----------------------------------------------------------------------  #  Order #                     Accession #                Episode #  1  [id number]                   [id number]                 [id number]  2  [id number]                   [id number]                 [id number] ---------------------------------------------------------------------- Indications  Maternal care for known or suspected poor      O36.5930  fetal growth, third trimester, single or  unspecified fetus IUGR  Marginal insertion of umbilical cord affecting O43.193  management of mother in third trimester  [redacted] weeks gestation of pregnancy                Z3A.30  Encounter for antenatal screening,             Z36.9  unspecified ---------------------------------------------------------------------- Fetal Evaluation  Num Of Fetuses:         1  Fetal Heart Rate(bpm):  142  Cardiac Activity:       Observed  Presentation:           Breech  Placenta:               Fundal  P. Cord Insertion:      Marg insertion previously seen  Amniotic Fluid  AFI FV:      Within normal limits  AFI Sum(cm)     %Tile       Largest Pocket(cm)  9               6           3.6  RUQ(cm)       RLQ(cm)       LUQ(cm)        LLQ(cm)  1.8           3.6           1.6            2 ---------------------------------------------------------------------- OB History  Gravidity:    2          SAB:   1 ---------------------------------------------------------------------- Gestational Age  LMP:           30w 3d        Date:  11/16/23                  EDD:   08/22/24  Best:          minnette 3d     Det. By:  LMP  (11/16/23)          EDD:   08/22/24 ---------------------------------------------------------------------- Anatomy  Cranium:               Appears normal         Stomach:                Appears normal, left                                                                         sided  Ventricles:            Appears normal         Kidneys:                Appear normal  Thoracic:              Appears normal         Bladder:                Appears normal  Diaphragm:             Appears normal ---------------------------------------------------------------------- Doppler - Fetal Vessels  Umbilical Artery   S/D     %tile      RI    %tile      PI    %tile     PSV    ADFV    RDFV                                                     (cm/s)  5.9   > 97.5     0.8   > 97.5     1.7   > 97.5     25.3      No      No ---------------------------------------------------------------------- Cervix Uterus Adnexa  Cervix  Not visualized (advanced GA >24wks)  Uterus  No abnormality visualized.  Right Ovary  Not visualized.  Left Ovary  Not visualized.  Cul De Sac  No free fluid seen.  Adnexa  No abnormality visualized ---------------------------------------------------------------------- Comments  This patient has been hospitalized due to severe IUGR.  Absent end-diastolic flow was noted on her umbilical artery  Doppler studies performed in earlier in her pregnancy.  On today's exam, there was normal amniotic fluid noted with  a total AFI of 9 cm.  Doppler studies of the umbilical arteries performed today  continues to show an elevated S/D ratio of 5.9.  There were  no signs of absent or reversed end-diastolic flow noted today.  The fetus was in the breech presentation.  She will continue inpatient management with Q shift  monitoring for now.  Delivery is recommended at any time for  nonreassuring fetal status or should she develop  preeclampsia. ----------------------------------------------------------------------                   Steffan Keys, MD Electronically Signed Final Report   06/16/2024 02:23 pm ----------------------------------------------------------------------   US  MFM OB LIMITED Result Date: 06/16/2024 ----------------------------------------------------------------------   OBSTETRICS REPORT                       (Signed Final 06/16/2024 02:23 pm) ---------------------------------------------------------------------- Patient Info  ID #:       [id number]                          D.O.B.:  [birth year] (22 yrs)(F)  Name:       Taylor Jefferson                Visit Date: 06/16/2024 07:41 am ---------------------------------------------------------------------- Performed By  Attending:        Steffan Keys MD         Secondary Phy.:   [facility]                                                             Care  Performed By:     Heather  Waken BS       [facility]  Referred By:      [facility] MAU/Triage ---------------------------------------------------------------------- Orders  #  Description                           Code        Ordered By  1  US  MFM UA CORD DOPPLER  23179.97    CHARLIE PICKENS  2  US  MFM OB LIMITED                     23184.98    CHARLIE PICKENS ----------------------------------------------------------------------  #  Order #                     Accession #                Episode #  1  [id number]                   [id number]                 [id number]  2  [id number]                   [id number]                 [id number] ---------------------------------------------------------------------- Indications  Maternal care for known or suspected poor      O36.5930  fetal growth, third trimester, single or  unspecified fetus IUGR  Marginal insertion of umbilical cord affecting O43.193  management of mother in third trimester  [redacted] weeks gestation of pregnancy                Z3A.30  Encounter for antenatal screening,             Z36.9  unspecified ---------------------------------------------------------------------- Fetal Evaluation  Num Of Fetuses:         1  Fetal Heart Rate(bpm):  142  Cardiac Activity:       Observed  Presentation:           Breech  Placenta:                Fundal  P. Cord Insertion:      Marg insertion previously seen  Amniotic Fluid  AFI FV:      Within normal limits  AFI Sum(cm)     %Tile       Largest Pocket(cm)  9               6           3.6  RUQ(cm)       RLQ(cm)       LUQ(cm)        LLQ(cm)  1.8           3.6           1.6            2 ---------------------------------------------------------------------- OB History  Gravidity:    2          SAB:   1 ---------------------------------------------------------------------- Gestational Age  LMP:           30w 3d        Date:  11/16/23                  EDD:   08/22/24  Best:          minnette 3d     Det. By:  LMP  (11/16/23)          EDD:   08/22/24 ---------------------------------------------------------------------- Anatomy  Cranium:               Appears normal         Stomach:                Appears normal, left  sided  Ventricles:            Appears normal         Kidneys:                Appear normal  Thoracic:              Appears normal         Bladder:                Appears normal  Diaphragm:             Appears normal ---------------------------------------------------------------------- Doppler - Fetal Vessels  Umbilical Artery   S/D     %tile      RI    %tile      PI    %tile     PSV    ADFV    RDFV                                                     (cm/s)    5.9   > 97.5     0.8   > 97.5     1.7   > 97.5     25.3      No      No ---------------------------------------------------------------------- Cervix Uterus Adnexa  Cervix  Not visualized (advanced GA >24wks)  Uterus  No abnormality visualized.  Right Ovary  Not visualized.  Left Ovary  Not visualized.  Cul De Sac  No free fluid seen.  Adnexa  No abnormality visualized ---------------------------------------------------------------------- Comments  This patient has been hospitalized due to severe IUGR.  Absent end-diastolic flow was noted on her umbilical artery  Doppler studies  performed in earlier in her pregnancy.  On today's exam, there was normal amniotic fluid noted with  a total AFI of 9 cm.  Doppler studies of the umbilical arteries performed today  continues to show an elevated S/D ratio of 5.9.  There were  no signs of absent or reversed end-diastolic flow noted today.  The fetus was in the breech presentation.  She will continue inpatient management with Q shift  monitoring for now.  Delivery is recommended at any time for  nonreassuring fetal status or should she develop  preeclampsia. ----------------------------------------------------------------------                   Steffan Keys, MD Electronically Signed Final Report   06/16/2024 02:23 pm ----------------------------------------------------------------------    Current scheduled medications  NIFEdipine   30 mg Oral BID   prenatal multivitamin  1 tablet Oral Q1200   sertraline   25 mg Oral Daily   sodium chloride  flush  3 mL Intravenous Q12H   valACYclovir   500 mg Oral BID    I have reviewed the patient's current medications.  ASSESSMENT: Principal Problem:   Fetal growth restriction, 500-749 grams Active Problems:   Aphthous ulcer   Gestational thrombocytopenia (HCC)   Elevated ALT measurement   PLAN: FGR <1% with iAEDF - UAD MWF - last elevated UAD, breech - NST qshift - s/p BMZ 8/19-20 - s/p Mag - plan to try to re-mag for 4 hours prior to delivery when indicated  2. cHTN - continue nifed 30 BID - labs q72h; earlier in admission had mildly elevated LFTs that resolved on CMP today. 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  Kieth Carolin, MD Obstetrician & Gynecologist, [facility] for [employer], [facility]

## 2024-06-18 ENCOUNTER — Inpatient Hospital Stay (HOSPITAL_BASED_OUTPATIENT_CLINIC_OR_DEPARTMENT_OTHER)

## 2024-06-18 DIAGNOSIS — O43193 Other malformation of placenta, third trimester: Secondary | ICD-10-CM | POA: Diagnosis not present

## 2024-06-18 DIAGNOSIS — O365930 Maternal care for other known or suspected poor fetal growth, third trimester, not applicable or unspecified: Secondary | ICD-10-CM

## 2024-06-18 DIAGNOSIS — Z3A30 30 weeks gestation of pregnancy: Secondary | ICD-10-CM

## 2024-06-18 MED ORDER — TRIAMCINOLONE ACETONIDE 0.1 % EX OINT
TOPICAL_OINTMENT | Freq: Two times a day (BID) | CUTANEOUS | Status: DC
  Filled 2024-06-18 (×2): qty 15

## 2024-06-18 NOTE — Progress Notes (Signed)
 FACULTY PRACTICE ANTEPARTUM COMPREHENSIVE PROGRESS NOTE  Taylor Jefferson is a 23 y.o. G2P0010 at [redacted]w[redacted]d who is admitted for FGR (<1%) with elevated iAEDF.  Estimated Date of Delivery: 08/22/24 Fetal presentation is breech.  Length of Stay:  10 Days. Admitted 06/08/2024  Subjective: No complaints. Notes aphthous ulcers bothering her. She has had these chronically. Doesn't have formal diagnosis of Behcet's.   Patient reports good fetal movement.  She reports no uterine contractions, no bleeding and no loss of fluid per vagina.  Vitals:  Blood pressure 115/77, pulse 82, temperature 98 F (36.7 C), temperature source Oral, resp. rate 18, height 5' 3 (1.6 m), weight 57.9 kg, last menstrual period 11/16/2023, SpO2 100%. Physical Examination: CONSTITUTIONAL: Well-developed, well-nourished female in no acute distress.  CARDIOVASCULAR: Normal heart rate noted RESPIRATORY: Effort normal, no problems with respiration noted ABDOMEN: Soft, nontender, nondistended, gravid.  Fetal monitoring: FHR: 150 bpm, Variability: moderate, Accelerations: Present (10x10), Decelerations: Absent  Uterine activity: flat  Results for orders placed or performed during the hospital encounter of 06/08/24 (from the past 48 hours)  Type and screen [facility]     Status: None   Collection Time: 06/17/24 12:40 AM  Result Value Ref Range   ABO/RH(D) B POS    Antibody Screen NEG    Sample Expiration      06/20/2024,2359 Performed at [facility], [address] [place], [place] [address]   CBC     Status: Abnormal   Collection Time: 06/17/24  7:21 AM  Result Value Ref Range   WBC 6.4 4.0 - 10.5 K/uL   RBC 4.41 3.87 - 5.11 MIL/uL   Hemoglobin 13.6 12.0 - 15.0 g/dL   HCT 61.1 63.9 - 53.9 %   MCV 88.0 80.0 - 100.0 fL   MCH 30.8 26.0 - 34.0 pg   MCHC 35.1 30.0 - 36.0 g/dL   RDW 87.4 88.4 - 84.4 %   Platelets 127 (L) 150 - 400 K/uL   nRBC 0.0 0.0 - 0.2 %    Comment: Performed at [facility], [address] [place], [place] [address]  Comprehensive metabolic panel     Status: Abnormal   Collection Time: 06/17/24  7:21 AM  Result Value Ref Range   Sodium 135 135 - 145 mmol/L   Potassium 3.9 3.5 - 5.1 mmol/L   Chloride 103 98 - 111 mmol/L   CO2 23 22 - 32 mmol/L   Glucose, Bld 77 70 - 99 mg/dL    Comment: Glucose reference range applies only to samples taken after fasting for at least 8 hours.   BUN 8 6 - 20 mg/dL   Creatinine, Ser 9.41 0.44 - 1.00 mg/dL   Calcium  9.0 8.9 - 10.3 mg/dL   Total Protein 6.9 6.5 - 8.1 g/dL   Albumin 2.9 (L) 3.5 - 5.0 g/dL   AST 25 15 - 41 U/L   ALT 37 0 - 44 U/L   Alkaline Phosphatase 145 (H) 38 - 126 U/L   Total Bilirubin 0.3 0.0 - 1.2 mg/dL   GFR, Estimated >39 >39 mL/min    Comment: (NOTE) Calculated using the CKD-EPI Creatinine Equation (2021)    Anion gap 9 5 - 15    Comment: Performed at [facility], [address], [place], [place] [address]    US  MFM UA CORD DOPPLER Result Date: 06/16/2024 ----------------------------------------------------------------------  OBSTETRICS REPORT                       (  Signed Final 06/16/2024 02:23 pm) ---------------------------------------------------------------------- Patient Info  ID #:       [id number]                          D.O.B.:  Jan 22, 2001 (22 yrs)(F)  Name:       Taylor Jefferson                Visit Date: 06/16/2024 07:41 am ---------------------------------------------------------------------- Performed By  Attending:        Steffan Keys MD         Secondary Phy.:   [facility]                                                             Care  Performed By:     Everitt Breen BS       [facility]  Referred By:      [facility] MAU/Triage ---------------------------------------------------------------------- Orders  #  Description                           Code        Ordered By  1  US  MFM UA  CORD DOPPLER                76820.02    CHARLIE PICKENS  2  US  MFM OB LIMITED                     76815.01    CHARLIE PICKENS ----------------------------------------------------------------------  #  Order #                     Accession #                Episode #  1  [id number]                   [id number]                 [id number]  2  [id number]                   [id number]                 [id number] ---------------------------------------------------------------------- Indications  Maternal care for known or suspected poor      O36.5930  fetal growth, third trimester, single or  unspecified fetus IUGR  Marginal insertion of umbilical cord affecting O43.193  management of mother in third trimester  [redacted] weeks gestation of pregnancy                Z3A.30  Encounter for antenatal screening,             Z36.9  unspecified ---------------------------------------------------------------------- Fetal Evaluation  Num Of Fetuses:         1  Fetal Heart Rate(bpm):  142  Cardiac Activity:       Observed  Presentation:           Breech  Placenta:               Fundal  P. Cord Insertion:      Marg insertion previously seen  Amniotic Fluid  AFI FV:      Within normal limits  AFI Sum(cm)     %Tile       Largest Pocket(cm)  9               6           3.6  RUQ(cm)       RLQ(cm)       LUQ(cm)        LLQ(cm)  1.8           3.6           1.6            2 ---------------------------------------------------------------------- OB History  Gravidity:    2          SAB:   1 ---------------------------------------------------------------------- Gestational Age  LMP:           30w 3d        Date:  11/16/23                  EDD:   08/22/24  Best:          minnette 3d     Det. By:  LMP  (11/16/23)          EDD:   08/22/24 ---------------------------------------------------------------------- Anatomy  Cranium:               Appears normal         Stomach:                Appears normal, left                                                                         sided  Ventricles:            Appears normal         Kidneys:                Appear normal  Thoracic:              Appears normal         Bladder:                Appears normal  Diaphragm:             Appears normal ---------------------------------------------------------------------- Doppler - Fetal Vessels  Umbilical Artery   S/D     %tile      RI    %tile      PI    %tile     PSV    ADFV    RDFV                                                     (cm/s)  5.9   > 97.5     0.8   > 97.5     1.7   > 97.5     25.3      No      No ---------------------------------------------------------------------- Cervix Uterus Adnexa  Cervix  Not visualized (advanced GA >24wks)  Uterus  No abnormality visualized.  Right Ovary  Not visualized.  Left Ovary  Not visualized.  Cul De Sac  No free fluid seen.  Adnexa  No abnormality visualized ---------------------------------------------------------------------- Comments  This patient has been hospitalized due to severe IUGR.  Absent end-diastolic flow was noted on her umbilical artery  Doppler studies performed in earlier in her pregnancy.  On today's exam, there was normal amniotic fluid noted with  a total AFI of 9 cm.  Doppler studies of the umbilical arteries performed today  continues to show an elevated S/D ratio of 5.9.  There were  no signs of absent or reversed end-diastolic flow noted today.  The fetus was in the breech presentation.  She will continue inpatient management with Q shift  monitoring for now.  Delivery is recommended at any time for  nonreassuring fetal status or should she develop  preeclampsia. ----------------------------------------------------------------------                   Steffan Keys, MD Electronically Signed Final Report   06/16/2024 02:23 pm ----------------------------------------------------------------------   US  MFM OB LIMITED Result Date: 06/16/2024 ----------------------------------------------------------------------  OBSTETRICS  REPORT                       (Signed Final 06/16/2024 02:23 pm) ---------------------------------------------------------------------- Patient Info  ID #:       [id number]                          D.O.B.:  10-Dec-2000 (22 yrs)(F)  Name:       Taylor Jefferson                Visit Date: 06/16/2024 07:41 am ---------------------------------------------------------------------- Performed By  Attending:        Steffan Keys MD         Secondary Phy.:   [facility]                                                             Care  Performed By:     Heather  Waken BS       [facility]  Referred By:      [facility] MAU/Triage ---------------------------------------------------------------------- Orders  #  Description                           Code        Ordered By  1  US  MFM UA CORD DOPPLER  23179.97    CHARLIE PICKENS  2  US  MFM OB LIMITED                     [id number]    CHARLIE PICKENS ----------------------------------------------------------------------  #  Order #                     Accession #                Episode #  1  [id number]                   [id number]                 [id number]  2  [id number]                   [id number]                 [id number] ---------------------------------------------------------------------- Indications  Maternal care for known or suspected poor      O36.5930  fetal growth, third trimester, single or  unspecified fetus IUGR  Marginal insertion of umbilical cord affecting O43.193  management of mother in third trimester  [redacted] weeks gestation of pregnancy                Z3A.30  Encounter for antenatal screening,             Z36.9  unspecified ---------------------------------------------------------------------- Fetal Evaluation  Num Of Fetuses:         1  Fetal Heart Rate(bpm):  142  Cardiac Activity:       Observed  Presentation:           Breech  Placenta:               Fundal  P. Cord  Insertion:      Marg insertion previously seen  Amniotic Fluid  AFI FV:      Within normal limits  AFI Sum(cm)     %Tile       Largest Pocket(cm)  9               6           3.6  RUQ(cm)       RLQ(cm)       LUQ(cm)        LLQ(cm)  1.8           3.6           1.6            2 ---------------------------------------------------------------------- OB History  Gravidity:    2          SAB:   1 ---------------------------------------------------------------------- Gestational Age  LMP:           30w 3d        Date:  11/16/23                  EDD:   08/22/24  Best:          minnette 3d     Det. By:  LMP  (11/16/23)          EDD:   08/22/24 ---------------------------------------------------------------------- Anatomy  Cranium:               Appears normal         Stomach:                Appears normal, left  sided  Ventricles:            Appears normal         Kidneys:                Appear normal  Thoracic:              Appears normal         Bladder:                Appears normal  Diaphragm:             Appears normal ---------------------------------------------------------------------- Doppler - Fetal Vessels  Umbilical Artery   S/D     %tile      RI    %tile      PI    %tile     PSV    ADFV    RDFV                                                     (cm/s)    5.9   > 97.5     0.8   > 97.5     1.7   > 97.5     25.3      No      No ---------------------------------------------------------------------- Cervix Uterus Adnexa  Cervix  Not visualized (advanced GA >24wks)  Uterus  No abnormality visualized.  Right Ovary  Not visualized.  Left Ovary  Not visualized.  Cul De Sac  No free fluid seen.  Adnexa  No abnormality visualized ---------------------------------------------------------------------- Comments  This patient has been hospitalized due to severe IUGR.  Absent end-diastolic flow was noted on her umbilical artery  Doppler studies performed in earlier  in her pregnancy.  On today's exam, there was normal amniotic fluid noted with  a total AFI of 9 cm.  Doppler studies of the umbilical arteries performed today  continues to show an elevated S/D ratio of 5.9.  There were  no signs of absent or reversed end-diastolic flow noted today.  The fetus was in the breech presentation.  She will continue inpatient management with Q shift  monitoring for now.  Delivery is recommended at any time for  nonreassuring fetal status or should she develop  preeclampsia. ----------------------------------------------------------------------                   Steffan Keys, MD Electronically Signed Final Report   06/16/2024 02:23 pm ----------------------------------------------------------------------    Current scheduled medications  NIFEdipine   30 mg Oral BID   prenatal multivitamin  1 tablet Oral Q1200   sertraline   25 mg Oral Daily   sodium chloride  flush  3 mL Intravenous Q12H   triamcinolone  ointment   Topical BID   valACYclovir   500 mg Oral BID    I have reviewed the patient's current medications.  ASSESSMENT: Principal Problem:   Fetal growth restriction, 500-749 grams Active Problems:   Aphthous ulcer   Gestational thrombocytopenia (HCC)   Elevated ALT measurement   PLAN: FGR <1% with iAEDF - UAD MWF - last elevated UAD, breech - NST qshift - s/p BMZ 8/19-20 - s/p Mag - plan to try to re-mag for 4 hours prior to delivery when indicated  2. cHTN - continue nifed 30 BID - labs q72h; earlier in admission had mildly elevated LFTs that resolved on latest CMP.  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 Aphthous Ulcers - Continue topical lidocaine .  - Will try topical steroid ointment for a few days to see if it helps. If not, will discontinue.   VTE ppx: SCDs  Continue routine antenatal care.  Vina Solian, MD Obstetrician & Gynecologist, [facility], [facility]

## 2024-06-19 NOTE — Plan of Care (Signed)
  Problem: Education: Goal: Knowledge of General Education information will improve Description: Including pain rating scale, medication(s)/side effects and non-pharmacologic comfort measures Outcome: Progressing   Problem: Health Behavior/Discharge Planning: Goal: Ability to manage health-related needs will improve Outcome: Progressing   Problem: Clinical Measurements: Goal: Ability to maintain clinical measurements within normal limits will improve Outcome: Progressing Goal: Will remain free from infection Outcome: Progressing Goal: Diagnostic test results will improve Outcome: Progressing Goal: Respiratory complications will improve Outcome: Progressing Goal: Cardiovascular complication will be avoided Outcome: Progressing   Problem: Activity: Goal: Risk for activity intolerance will decrease Outcome: Progressing   Problem: Nutrition: Goal: Adequate nutrition will be maintained Outcome: Progressing   Problem: Coping: Goal: Level of anxiety will decrease Outcome: Progressing   Problem: Elimination: Goal: Will not experience complications related to bowel motility Outcome: Progressing Goal: Will not experience complications related to urinary retention Outcome: Progressing   Problem: Pain Managment: Goal: General experience of comfort will improve and/or be controlled Outcome: Progressing   Problem: Safety: Goal: Ability to remain free from injury will improve Outcome: Progressing   Problem: Skin Integrity: Goal: Risk for impaired skin integrity will decrease Outcome: Progressing   Problem: Education: Goal: Knowledge of disease or condition will improve Outcome: Progressing Goal: Knowledge of the prescribed therapeutic regimen will improve Outcome: Progressing   Problem: Clinical Measurements: Goal: Complications related to the disease process, condition or treatment will be avoided or minimized Outcome: Progressing

## 2024-06-19 NOTE — Progress Notes (Signed)
 Patient ID: Taylor Jefferson, female   DOB: 08-Dec-2000, 23 y.o.   MRN: [id number] FACULTY PRACTICE ANTEPARTUM(COMPREHENSIVE) NOTE  Taylor Jefferson is a 23 y.o. G2P0010 with Estimated Date of Delivery: 08/22/24   By  early ultrasound [redacted]w[redacted]d  who is admitted for FGR, severe, <1%, with AEDF.    Fetal presentation is breech. Length of Stay:  11  Days  Date of admission:06/08/2024  Subjective: No complaints Patient reports the fetal movement as active. Patient reports uterine contraction  activity as none. Patient reports  vaginal bleeding as none. Patient describes fluid per vagina as None.  Vitals:  Blood pressure 127/77, pulse 100, temperature 98.4 F (36.9 C), temperature source Oral, resp. rate 16, height 5' 3 (1.6 m), weight 57.9 kg, last menstrual period 11/16/2023, SpO2 100%. Vitals:   06/18/24 1210 06/18/24 1557 06/18/24 1954 06/19/24 0009  BP: 118/84 133/89 123/89 127/77  Pulse: 94 93 100 100  Resp: 19 18 18 16   Temp: 98.3 F (36.8 C) 98.4 F (36.9 C) 98 F (36.7 C) 98.4 F (36.9 C)  TempSrc: Oral Oral Oral Oral  SpO2: 100% 98% 99% 100%  Weight:      Height:       Physical Examination:  General appearance - alert, well appearing, and in no distress Abdomen - soft, nontender, nondistended, no masses or organomegaly Fundal Height:  size less than dates Pelvic Exam:  examination not indicated Cervical Exam: Not evaluated. Extremities: extremities normal, atraumatic, no cyanosis or edema with DTRs 2+ bilaterally Membranes:intact  Fetal Monitoring:  Baseline: 150 bpm, Variability: Fair (1-6 bpm), Accelerations: Non-reactive but appropriate for gestational age, and Decelerations: Absent   equivocal  Labs:  No results found for this or any previous visit (from the past 24 hours).  Imaging Studies:    US  MFM UA CORD DOPPLER Result Date: 06/16/2024 ----------------------------------------------------------------------  OBSTETRICS REPORT                       (Signed Final  06/16/2024 02:23 pm) ---------------------------------------------------------------------- Patient Info  ID #:       [id number]                          D.O.B.:  04-12-2001 (22 yrs)(F)  Name:       Taylor Jefferson                Visit Date: 06/16/2024 07:41 am ---------------------------------------------------------------------- Performed By  Attending:        Steffan Keys MD         Secondary Phy.:   [facility]                                                             Care  Performed By:     Heather  Waken BS       [facility]  Referred  By:      [facility] MAU/Triage ---------------------------------------------------------------------- Orders  #  Description                           Code        Ordered By  1  US  MFM UA CORD DOPPLER                [id number]    CHARLIE PICKENS  2  US  MFM OB LIMITED                     [id number]    CHARLIE PICKENS ----------------------------------------------------------------------  #  Order #                     Accession #                Episode #  1  [id number]                   [id number]                 [id number]  2  [id number]                   [id number]                 [id number] ---------------------------------------------------------------------- Indications  Maternal care for known or suspected poor      O36.5930  fetal growth, third trimester, single or  unspecified fetus IUGR  Marginal insertion of umbilical cord affecting O43.193  management of mother in third trimester  [redacted] weeks gestation of pregnancy                Z3A.30  Encounter for antenatal screening,             Z36.9  unspecified ---------------------------------------------------------------------- Fetal Evaluation  Num Of Fetuses:         1  Fetal Heart Rate(bpm):  142  Cardiac Activity:       Observed  Presentation:           Breech  Placenta:               Fundal  P. Cord Insertion:      Marg insertion previously  seen  Amniotic Fluid  AFI FV:      Within normal limits  AFI Sum(cm)     %Tile       Largest Pocket(cm)  9               6           3.6  RUQ(cm)       RLQ(cm)       LUQ(cm)        LLQ(cm)  1.8           3.6           1.6            2 ---------------------------------------------------------------------- OB History  Gravidity:    2          SAB:   1 ---------------------------------------------------------------------- Gestational Age  LMP:           30w 3d        Date:  11/16/23                  EDD:   08/22/24  Best:          minnette 3d  Det. By:  LMP  (11/16/23)          EDD:   08/22/24 ---------------------------------------------------------------------- Anatomy  Cranium:               Appears normal         Stomach:                Appears normal, left                                                                        sided  Ventricles:            Appears normal         Kidneys:                Appear normal  Thoracic:              Appears normal         Bladder:                Appears normal  Diaphragm:             Appears normal ---------------------------------------------------------------------- Doppler - Fetal Vessels  Umbilical Artery   S/D     %tile      RI    %tile      PI    %tile     PSV    ADFV    RDFV                                                     (cm/s)    5.9   > 97.5     0.8   > 97.5     1.7   > 97.5     25.3      No      No ---------------------------------------------------------------------- Cervix Uterus Adnexa  Cervix  Not visualized (advanced GA >24wks)  Uterus  No abnormality visualized.  Right Ovary  Not visualized.  Left Ovary  Not visualized.  Cul De Sac  No free fluid seen.  Adnexa  No abnormality visualized ---------------------------------------------------------------------- Comments  This patient has been hospitalized due to severe IUGR.  Absent end-diastolic flow was noted on her umbilical artery  Doppler studies performed in earlier in her pregnancy.  On today's exam, there  was normal amniotic fluid noted with  a total AFI of 9 cm.  Doppler studies of the umbilical arteries performed today  continues to show an elevated S/D ratio of 5.9.  There were  no signs of absent or reversed end-diastolic flow noted today.  The fetus was in the breech presentation.  She will continue inpatient management with Q shift  monitoring for now.  Delivery is recommended at any time for  nonreassuring fetal status or should she develop  preeclampsia. ----------------------------------------------------------------------                   Steffan Keys, MD Electronically Signed Final Report   06/16/2024 02:23 pm ----------------------------------------------------------------------   US  MFM OB LIMITED Result Date: 06/16/2024 ----------------------------------------------------------------------  OBSTETRICS REPORT                       (  Signed Final 06/16/2024 02:23 pm) ---------------------------------------------------------------------- Patient Info  ID #:       [id number]                          D.O.B.:  Mar 14, 2001 (22 yrs)(F)  Name:       Taylor Jefferson                Visit Date: 06/16/2024 07:41 am ---------------------------------------------------------------------- Performed By  Attending:        Steffan Keys MD         Secondary Phy.:   [facility]                                                             Care  Performed By:     Mirian Breen BS       [facility]  Referred By:      [facility] MAU/Triage ---------------------------------------------------------------------- Orders  #  Description                           Code        Ordered By  1  US  MFM UA CORD DOPPLER                76820.02    CHARLIE PICKENS  2  US  MFM OB LIMITED                     76815.01    CHARLIE PICKENS ----------------------------------------------------------------------  #  Order #                     Accession #                 Episode #  1  [id number]                   [id number]                 [id number]  2  [id number]                   [id number]                 [id number] ---------------------------------------------------------------------- Indications  Maternal care for known or suspected poor      O36.5930  fetal growth, third trimester, single or  unspecified fetus IUGR  Marginal insertion of umbilical cord affecting O43.193  management of mother in third trimester  [redacted] weeks gestation of pregnancy                Z3A.30  Encounter for antenatal screening,             Z36.9  unspecified ---------------------------------------------------------------------- Fetal Evaluation  Num Of Fetuses:         1  Fetal Heart Rate(bpm):  142  Cardiac Activity:       Observed  Presentation:           Breech  Placenta:               Fundal  P. Cord Insertion:      Marg insertion previously seen  Amniotic Fluid  AFI FV:      Within normal limits  AFI Sum(cm)     %Tile       Largest Pocket(cm)  9               6           3.6  RUQ(cm)       RLQ(cm)       LUQ(cm)        LLQ(cm)  1.8           3.6           1.6            2 ---------------------------------------------------------------------- OB History  Gravidity:    2          SAB:   1 ---------------------------------------------------------------------- Gestational Age  LMP:           30w 3d        Date:  11/16/23                  EDD:   08/22/24  Best:          minnette 3d     Det. By:  LMP  (11/16/23)          EDD:   08/22/24 ---------------------------------------------------------------------- Anatomy  Cranium:               Appears normal         Stomach:                Appears normal, left                                                                        sided  Ventricles:            Appears normal         Kidneys:                Appear normal  Thoracic:              Appears normal         Bladder:                Appears normal  Diaphragm:             Appears normal  ---------------------------------------------------------------------- Doppler - Fetal Vessels  Umbilical Artery   S/D     %tile      RI    %tile      PI    %tile     PSV    ADFV    RDFV                                                     (cm/s)  5.9   > 97.5     0.8   > 97.5     1.7   > 97.5     25.3      No      No ---------------------------------------------------------------------- Cervix Uterus Adnexa  Cervix  Not visualized (advanced GA >24wks)  Uterus  No abnormality visualized.  Right Ovary  Not visualized.  Left Ovary  Not visualized.  Cul De Sac  No free fluid seen.  Adnexa  No abnormality visualized ---------------------------------------------------------------------- Comments  This patient has been hospitalized due to severe IUGR.  Absent end-diastolic flow was noted on her umbilical artery  Doppler studies performed in earlier in her pregnancy.  On today's exam, there was normal amniotic fluid noted with  a total AFI of 9 cm.  Doppler studies of the umbilical arteries performed today  continues to show an elevated S/D ratio of 5.9.  There were  no signs of absent or reversed end-diastolic flow noted today.  The fetus was in the breech presentation.  She will continue inpatient management with Q shift  monitoring for now.  Delivery is recommended at any time for  nonreassuring fetal status or should she develop  preeclampsia. ----------------------------------------------------------------------                   Steffan Keys, MD Electronically Signed Final Report   06/16/2024 02:23 pm ----------------------------------------------------------------------     Medications:  Scheduled  NIFEdipine   30 mg Oral BID   prenatal multivitamin  1 tablet Oral Q1200   sertraline   25 mg Oral Daily   sodium chloride  flush  3 mL Intravenous Q12H   triamcinolone  ointment   Topical BID   valACYclovir   500 mg Oral BID   I have reviewed the patient's current medications.  ASSESSMENT: G2P0010 [redacted]w[redacted]d Estimated  Date of Delivery: 08/22/24  FGR, severe, <1% AEDF cHTN   PLAN: Sonogram 9/3 to evaluate UAD + interval growth and likely little growth which would necessitate delivery Of course monitorin UAD + EFM for fetal status in the interime BP well managed S/P BMZ Mag prophylaxis boost 4 hours prior to delivery if possible  [employer] 06/19/2024,10:48 AM

## 2024-06-20 DIAGNOSIS — O10913 Unspecified pre-existing hypertension complicating pregnancy, third trimester: Secondary | ICD-10-CM

## 2024-06-20 LAB — COMPREHENSIVE METABOLIC PANEL WITH GFR
ALT: 27 U/L (ref 0–44)
AST: 21 U/L (ref 15–41)
Albumin: 2.9 g/dL — ABNORMAL LOW (ref 3.5–5.0)
Alkaline Phosphatase: 150 U/L — ABNORMAL HIGH (ref 38–126)
Anion gap: 10 (ref 5–15)
BUN: 10 mg/dL (ref 6–20)
CO2: 20 mmol/L — ABNORMAL LOW (ref 22–32)
Calcium: 8.7 mg/dL — ABNORMAL LOW (ref 8.9–10.3)
Chloride: 104 mmol/L (ref 98–111)
Creatinine, Ser: 0.61 mg/dL (ref 0.44–1.00)
GFR, Estimated: >60 mL/min (ref >60)
Glucose, Bld: 61 mg/dL — ABNORMAL LOW (ref 70–99)
Potassium: 3.6 mmol/L (ref 3.5–5.1)
Sodium: 134 mmol/L — ABNORMAL LOW (ref 135–145)
Total Bilirubin: 0.4 mg/dL (ref 0.0–1.2)
Total Protein: 6.7 g/dL (ref 6.5–8.1)

## 2024-06-20 LAB — TYPE AND SCREEN
ABO/RH(D): B POS
Antibody Screen: NEGATIVE

## 2024-06-20 LAB — CBC
HCT: 36.5 % (ref 36.0–46.0)
Hemoglobin: 12.7 g/dL (ref 12.0–15.0)
MCH: 30.6 pg (ref 26.0–34.0)
MCHC: 34.8 g/dL (ref 30.0–36.0)
MCV: 88 fL (ref 80.0–100.0)
Platelets: 150 K/uL (ref 150–400)
RBC: 4.15 MIL/uL (ref 3.87–5.11)
RDW: 12.4 % (ref 11.5–15.5)
WBC: 6.6 K/uL (ref 4.0–10.5)
nRBC: 0 % (ref 0.0–0.2)

## 2024-06-20 MED ORDER — TERBUTALINE SULFATE 1 MG/ML IJ SOLN
0.2500 mg | Freq: Once | INTRAMUSCULAR | Status: AC
  Administered 2024-06-20: 0.25 mg via SUBCUTANEOUS
  Filled 2024-06-20: qty 1

## 2024-06-20 NOTE — Plan of Care (Signed)
  Problem: Education: Goal: Knowledge of General Education information will improve Description: Including pain rating scale, medication(s)/side effects and non-pharmacologic comfort measures Outcome: Progressing   Problem: Health Behavior/Discharge Planning: Goal: Ability to manage health-related needs will improve Outcome: Progressing   Problem: Clinical Measurements: Goal: Ability to maintain clinical measurements within normal limits will improve Outcome: Progressing Goal: Will remain free from infection Outcome: Progressing Goal: Diagnostic test results will improve Outcome: Progressing Goal: Respiratory complications will improve Outcome: Progressing Goal: Cardiovascular complication will be avoided Outcome: Progressing   Problem: Activity: Goal: Risk for activity intolerance will decrease Outcome: Progressing   Problem: Nutrition: Goal: Adequate nutrition will be maintained Outcome: Progressing   Problem: Coping: Goal: Level of anxiety will decrease Outcome: Progressing   Problem: Elimination: Goal: Will not experience complications related to bowel motility Outcome: Progressing Goal: Will not experience complications related to urinary retention Outcome: Progressing   Problem: Pain Managment: Goal: General experience of comfort will improve and/or be controlled Outcome: Progressing   Problem: Safety: Goal: Ability to remain free from injury will improve Outcome: Progressing   Problem: Skin Integrity: Goal: Risk for impaired skin integrity will decrease Outcome: Progressing   Problem: Education: Goal: Knowledge of disease or condition will improve Outcome: Progressing Goal: Knowledge of the prescribed therapeutic regimen will improve Outcome: Progressing   Problem: Clinical Measurements: Goal: Complications related to the disease process, condition or treatment will be avoided or minimized Outcome: Progressing

## 2024-06-20 NOTE — Progress Notes (Signed)
 FACULTY PRACTICE ANTEPARTUM(COMPREHENSIVE) NOTE  Taylor Jefferson is a 23 y.o. G2P0010 at [redacted]w[redacted]d  who is admitted for  severe FGR (<1%) with AEDF.    Fetal presentation is breech. Length of Stay:  12  Days  Date of admission:06/08/2024  Subjective: No complaints. Patient reports the fetal movement as active. Patient reports uterine contraction  activity as none. Patient reports  vaginal bleeding as none. Patient describes fluid per vagina as none.  Vitals:  Blood pressure 112/77, pulse 83, temperature 98.1 F (36.7 C), temperature source Oral, resp. rate 16, height 5' 3 (1.6 m), weight 57.9 kg, last menstrual period 11/16/2023, SpO2 98%. Vitals:   06/19/24 1153 06/19/24 1947 06/19/24 2309 06/20/24 0439  BP: 132/88 129/83 (!) 126/90 112/77  Pulse: 94 (!) 101 88 83  Resp: 17 16 18 16   Temp: 98.3 F (36.8 C) 98.7 F (37.1 C) 98 F (36.7 C) 98.1 F (36.7 C)  TempSrc:  Oral Oral Oral  SpO2: 99% 99% 98% 98%  Weight:      Height:       Physical Examination: General appearance - alert, well appearing, and in no distress Abdomen - soft, nontender, nondistended, no masses or organomegaly Fundal Height:  size less than dates Pelvic Exam:  examination not indicated Cervical Exam: Not evaluated. Extremities: extremities normal, atraumatic, no cyanosis or edema with DTRs 2+ bilaterally Membranes:intact  Fetal Monitoring:  Baseline: 145 bpm, Variability:Moderate, Accelerations: 10 x10 Non-reactive but appropriate for gestational age, and Decelerations: Absent   equivocal  Labs:  Results for orders placed or performed during the hospital encounter of 06/08/24 (from the past 24 hours)  CBC   Collection Time: 06/20/24  4:19 AM  Result Value Ref Range   WBC 6.6 4.0 - 10.5 K/uL   RBC 4.15 3.87 - 5.11 MIL/uL   Hemoglobin 12.7 12.0 - 15.0 g/dL   HCT 63.4 63.9 - 53.9 %   MCV 88.0 80.0 - 100.0 fL   MCH 30.6 26.0 - 34.0 pg   MCHC 34.8 30.0 - 36.0 g/dL   RDW 87.5 88.4 - 84.4 %   Platelets  150 150 - 400 K/uL   nRBC 0.0 0.0 - 0.2 %  Comprehensive metabolic panel   Collection Time: 06/20/24  4:19 AM  Result Value Ref Range   Sodium 134 (L) 135 - 145 mmol/L   Potassium 3.6 3.5 - 5.1 mmol/L   Chloride 104 98 - 111 mmol/L   CO2 20 (L) 22 - 32 mmol/L   Glucose, Bld 61 (L) 70 - 99 mg/dL   BUN 10 6 - 20 mg/dL   Creatinine, Ser 9.38 0.44 - 1.00 mg/dL   Calcium  8.7 (L) 8.9 - 10.3 mg/dL   Total Protein 6.7 6.5 - 8.1 g/dL   Albumin 2.9 (L) 3.5 - 5.0 g/dL   AST 21 15 - 41 U/L   ALT 27 0 - 44 U/L   Alkaline Phosphatase 150 (H) 38 - 126 U/L   Total Bilirubin 0.4 0.0 - 1.2 mg/dL   GFR, Estimated >39 >39 mL/min   Anion gap 10 5 - 15    Imaging Studies:    US  MFM UA CORD DOPPLER Result Date: 06/19/2024 ----------------------------------------------------------------------  OBSTETRICS REPORT                        (Signed Final 06/19/2024 02:50 pm) ---------------------------------------------------------------------- Patient Info  ID #:       [id number]  D.O.B.:  June 02, 2001 (22 yrs)(F)  Name:       Taylor Jefferson                Visit Date: 06/18/2024 07:53 am ---------------------------------------------------------------------- Performed By  Attending:        Nathanel Fetters      Secondary Phy.:    [facility]                    MD                                                              Care  Performed By:     Metta Ar          [facility]  Referred By:      [facility] MAU/Triage ---------------------------------------------------------------------- Orders  #  Description                           Code        Ordered By  1  US  MFM UA CORD DOPPLER                76820.02    CHARLIE PICKENS  2  US  MFM OB LIMITED                     76815.01    CHARLIE PICKENS ----------------------------------------------------------------------  #  Order #                      Accession #                Episode #  1  [id number]                   [id number]                 [id number]  2  [id number]                   [id number]                 [id number] ---------------------------------------------------------------------- Indications  Maternal care for known or suspected poor       O36.5930  fetal growth, third trimester, single or  unspecified fetus IUGR  Marginal insertion of umbilical cord affecting  O43.193  management of mother in third trimester  Encounter for antenatal screening,              Z36.9  unspecified  [redacted] weeks gestation of pregnancy                 Z3A.30 ---------------------------------------------------------------------- Fetal Evaluation  Num Of Fetuses:          1  Fetal Heart Rate(bpm):   140  Cardiac Activity:        Observed  Presentation:  Breech  Placenta:                Fundal  P. Cord Insertion:       Marg insertion previously seen  Amniotic Fluid  AFI FV:      Within normal limits  AFI Sum(cm)     %Tile       Largest Pocket(cm)  10.1            15          3.1  RUQ(cm)       RLQ(cm)       LUQ(cm)        LLQ(cm)  2             2.2           3.1            2.8 ---------------------------------------------------------------------- Biometry  LV:        2.9  mm ---------------------------------------------------------------------- OB History  Gravidity:    2          SAB:   1 ---------------------------------------------------------------------- Gestational Age  LMP:           30w 5d        Date:  11/16/23                  EDD:   08/22/24  Best:          30w 5d     Det. By:  LMP  (11/16/23)          EDD:   08/22/24 ---------------------------------------------------------------------- Anatomy  Cranium:               Appears normal         Kidneys:                Appear normal  Diaphragm:             Appears normal         Bladder:                Appears normal  Stomach:               Appears normal, left                         sided  ---------------------------------------------------------------------- Doppler - Fetal Vessels  Umbilical Artery    S/D    %tile      RI    %tile      PI    %tile     PSV    ADFV    RDFV                                                     (cm/s)    6.1   > 97.5     0.8   > 97.5     1.6   > 97.5       34      No      No ---------------------------------------------------------------------- Impression  Limited exam to assess the UAD and amniotic fluid due to  fetal growth restriction EFW <1%  The UAD are elevated without evidence AEDF or REDF  Good fetal movement and amniotic fluid volume. ---------------------------------------------------------------------- Recommendations  Continue 2x weekly testing  Repeat growth in 1 week. ----------------------------------------------------------------------  Nathanel Fetters, MD Electronically Signed Final Report   06/19/2024 02:50 pm ----------------------------------------------------------------------   US  MFM OB LIMITED Result Date: 06/19/2024 ----------------------------------------------------------------------  OBSTETRICS REPORT                        (Signed Final 06/19/2024 02:50 pm) ---------------------------------------------------------------------- Patient Info  ID #:       [id number]                          D.O.B.:  [birth year] (22 yrs)(F)  Name:       Taylor Jefferson                Visit Date: 06/18/2024 07:53 am ---------------------------------------------------------------------- Performed By  Attending:        Nathanel Fetters      Secondary Phy.:    [facility]                    MD                                                              Care  Performed By:     Metta Ar          [facility]  Referred By:      [facility] MAU/Triage ---------------------------------------------------------------------- Orders  #  Description                            Code        Ordered By  1  US  MFM UA CORD DOPPLER                76820.02    CHARLIE PICKENS  2  US  MFM OB LIMITED                     76815.01    CHARLIE PICKENS ----------------------------------------------------------------------  #  Order #                     Accession #                Episode #  1  [id number]                   [id number]                 [id number]  2  [id number]                   [id number]                 [id number] ---------------------------------------------------------------------- Indications  Maternal care for known or suspected poor       O36.5930  fetal growth, third trimester, single or  unspecified fetus IUGR  Marginal insertion of umbilical cord  affecting  O43.193  management of mother in third trimester  Encounter for antenatal screening,              Z36.9  unspecified  [redacted] weeks gestation of pregnancy                 Z3A.30 ---------------------------------------------------------------------- Fetal Evaluation  Num Of Fetuses:          1  Fetal Heart Rate(bpm):   140  Cardiac Activity:        Observed  Presentation:            Breech  Placenta:                Fundal  P. Cord Insertion:       Marg insertion previously seen  Amniotic Fluid  AFI FV:      Within normal limits  AFI Sum(cm)     %Tile       Largest Pocket(cm)  10.1            15          3.1  RUQ(cm)       RLQ(cm)       LUQ(cm)        LLQ(cm)  2             2.2           3.1            2.8 ---------------------------------------------------------------------- Biometry  LV:        2.9  mm ---------------------------------------------------------------------- OB History  Gravidity:    2          SAB:   1 ---------------------------------------------------------------------- Gestational Age  LMP:           30w 5d        Date:  11/16/23                  EDD:   08/22/24  Best:          30w 5d     Det. By:  LMP  (11/16/23)          EDD:   08/22/24 ----------------------------------------------------------------------  Anatomy  Cranium:               Appears normal         Kidneys:                Appear normal  Diaphragm:             Appears normal         Bladder:                Appears normal  Stomach:               Appears normal, left                         sided ---------------------------------------------------------------------- Doppler - Fetal Vessels  Umbilical Artery    S/D    %tile      RI    %tile      PI    %tile     PSV    ADFV    RDFV                                                     (  cm/s)    6.1   > 97.5     0.8   > 97.5     1.6   > 97.5       34      No      No ---------------------------------------------------------------------- Impression  Limited exam to assess the UAD and amniotic fluid due to  fetal growth restriction EFW <1%  The UAD are elevated without evidence AEDF or REDF  Good fetal movement and amniotic fluid volume. ---------------------------------------------------------------------- Recommendations  Continue 2x weekly testing  Repeat growth in 1 week. ----------------------------------------------------------------------              Nathanel Fetters, MD Electronically Signed Final Report   06/19/2024 02:50 pm ----------------------------------------------------------------------   US  MFM UA CORD DOPPLER Result Date: 06/16/2024 ----------------------------------------------------------------------  OBSTETRICS REPORT                       (Signed Final 06/16/2024 02:23 pm) ---------------------------------------------------------------------- Patient Info  ID #:       [id number]                          D.O.B.:  May 05, 2001 (22 yrs)(F)  Name:       Taylor Jefferson                Visit Date: 06/16/2024 07:41 am ---------------------------------------------------------------------- Performed By  Attending:        Steffan Keys MD         Secondary Phy.:   [facility]                                                             Care  Performed By:     Hosey Breen BS       [facility]  Referred By:      [facility] MAU/Triage ---------------------------------------------------------------------- Orders  #  Description                           Code        Ordered By  1  US  MFM UA CORD DOPPLER                76820.02    CHARLIE PICKENS  2  US  MFM OB LIMITED                     23184.98    CHARLIE PICKENS ----------------------------------------------------------------------  #  Order #                     Accession #                Episode #  1  [id number]                   [id number]  [id number]  2  [id number]                   [id number]                 [id number] ---------------------------------------------------------------------- Indications  Maternal care for known or suspected poor      O36.5930  fetal growth, third trimester, single or  unspecified fetus IUGR  Marginal insertion of umbilical cord affecting O43.193  management of mother in third trimester  [redacted] weeks gestation of pregnancy                Z3A.30  Encounter for antenatal screening,             Z36.9  unspecified ---------------------------------------------------------------------- Fetal Evaluation  Num Of Fetuses:         1  Fetal Heart Rate(bpm):  142  Cardiac Activity:       Observed  Presentation:           Breech  Placenta:               Fundal  P. Cord Insertion:      Marg insertion previously seen  Amniotic Fluid  AFI FV:      Within normal limits  AFI Sum(cm)     %Tile       Largest Pocket(cm)  9               6           3.6  RUQ(cm)       RLQ(cm)       LUQ(cm)        LLQ(cm)  1.8           3.6           1.6            2 ---------------------------------------------------------------------- OB History  Gravidity:    2          SAB:   1 ---------------------------------------------------------------------- Gestational Age  LMP:           30w 3d        Date:  11/16/23                  EDD:   08/22/24  Best:          minnette 3d      Det. By:  LMP  (11/16/23)          EDD:   08/22/24 ---------------------------------------------------------------------- Anatomy  Cranium:               Appears normal         Stomach:                Appears normal, left                                                                        sided  Ventricles:            Appears normal         Kidneys:                Appear normal  Thoracic:  Appears normal         Bladder:                Appears normal  Diaphragm:             Appears normal ---------------------------------------------------------------------- Doppler - Fetal Vessels  Umbilical Artery   S/D     %tile      RI    %tile      PI    %tile     PSV    ADFV    RDFV                                                     (cm/s)    5.9   > 97.5     0.8   > 97.5     1.7   > 97.5     25.3      No      No ---------------------------------------------------------------------- Cervix Uterus Adnexa  Cervix  Not visualized (advanced GA >24wks)  Uterus  No abnormality visualized.  Right Ovary  Not visualized.  Left Ovary  Not visualized.  Cul De Sac  No free fluid seen.  Adnexa  No abnormality visualized ---------------------------------------------------------------------- Comments  This patient has been hospitalized due to severe IUGR.  Absent end-diastolic flow was noted on her umbilical artery  Doppler studies performed in earlier in her pregnancy.  On today's exam, there was normal amniotic fluid noted with  a total AFI of 9 cm.  Doppler studies of the umbilical arteries performed today  continues to show an elevated S/D ratio of 5.9.  There were  no signs of absent or reversed end-diastolic flow noted today.  The fetus was in the breech presentation.  She will continue inpatient management with Q shift  monitoring for now.  Delivery is recommended at any time for  nonreassuring fetal status or should she develop  preeclampsia. ----------------------------------------------------------------------                    Steffan Keys, MD Electronically Signed Final Report   06/16/2024 02:23 pm ----------------------------------------------------------------------   US  MFM OB LIMITED Result Date: 06/16/2024 ----------------------------------------------------------------------  OBSTETRICS REPORT                       (Signed Final 06/16/2024 02:23 pm) ---------------------------------------------------------------------- Patient Info  ID #:       [id number]                          D.O.B.:  May 20, 2001 (22 yrs)(F)  Name:       Taylor Jefferson                Visit Date: 06/16/2024 07:41 am ---------------------------------------------------------------------- Performed By  Attending:        Steffan Keys MD         Secondary Phy.:   [facility]                                                             Care  Performed By:  Heather  Waken BS       [facility]  Referred By:      [facility] MAU/Triage ---------------------------------------------------------------------- Orders  #  Description                           Code        Ordered By  1  US  MFM UA CORD DOPPLER                76820.02    CHARLIE PICKENS  2  US  MFM OB LIMITED                     76815.01    CHARLIE PICKENS ----------------------------------------------------------------------  #  Order #                     Accession #                Episode #  1  [id number]                   [id number]                 [id number]  2  [id number]                   [id number]                 [id number] ---------------------------------------------------------------------- Indications  Maternal care for known or suspected poor      O36.5930  fetal growth, third trimester, single or  unspecified fetus IUGR  Marginal insertion of umbilical cord affecting O43.193  management of mother in third trimester  [redacted] weeks gestation of pregnancy                Z3A.30  Encounter for antenatal  screening,             Z36.9  unspecified ---------------------------------------------------------------------- Fetal Evaluation  Num Of Fetuses:         1  Fetal Heart Rate(bpm):  142  Cardiac Activity:       Observed  Presentation:           Breech  Placenta:               Fundal  P. Cord Insertion:      Marg insertion previously seen  Amniotic Fluid  AFI FV:      Within normal limits  AFI Sum(cm)     %Tile       Largest Pocket(cm)  9               6           3.6  RUQ(cm)       RLQ(cm)       LUQ(cm)        LLQ(cm)  1.8           3.6           1.6            2 ---------------------------------------------------------------------- OB History  Gravidity:  2          SAB:   1 ---------------------------------------------------------------------- Gestational Age  LMP:           30w 3d        Date:  11/16/23                  EDD:   08/22/24  Best:          minnette 3d     Det. By:  LMP  (11/16/23)          EDD:   08/22/24 ---------------------------------------------------------------------- Anatomy  Cranium:               Appears normal         Stomach:                Appears normal, left                                                                        sided  Ventricles:            Appears normal         Kidneys:                Appear normal  Thoracic:              Appears normal         Bladder:                Appears normal  Diaphragm:             Appears normal ---------------------------------------------------------------------- Doppler - Fetal Vessels  Umbilical Artery   S/D     %tile      RI    %tile      PI    %tile     PSV    ADFV    RDFV                                                     (cm/s)    5.9   > 97.5     0.8   > 97.5     1.7   > 97.5     25.3      No      No ---------------------------------------------------------------------- Cervix Uterus Adnexa  Cervix  Not visualized (advanced GA >24wks)  Uterus  No abnormality visualized.  Right Ovary  Not visualized.  Left Ovary  Not visualized.  Cul De  Sac  No free fluid seen.  Adnexa  No abnormality visualized ---------------------------------------------------------------------- Comments  This patient has been hospitalized due to severe IUGR.  Absent end-diastolic flow was noted on her umbilical artery  Doppler studies performed in earlier in her pregnancy.  On today's exam, there was normal amniotic fluid noted with  a total AFI of 9 cm.  Doppler studies of the umbilical arteries performed today  continues to show an elevated S/D ratio of 5.9.  There were  no signs of absent or reversed end-diastolic flow noted today.  The fetus was in the breech presentation.  She  will continue inpatient management with Q shift  monitoring for now.  Delivery is recommended at any time for  nonreassuring fetal status or should she develop  preeclampsia. ----------------------------------------------------------------------                   Steffan Keys, MD Electronically Signed Final Report   06/16/2024 02:23 pm ----------------------------------------------------------------------     Medications:  Scheduled  NIFEdipine   30 mg Oral BID   prenatal multivitamin  1 tablet Oral Q1200   sertraline   25 mg Oral Daily   sodium chloride  flush  3 mL Intravenous Q12H   triamcinolone  ointment   Topical BID   valACYclovir   500 mg Oral BID   I have reviewed the patient's current medications.  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

## 2024-06-21 ENCOUNTER — Inpatient Hospital Stay (HOSPITAL_BASED_OUTPATIENT_CLINIC_OR_DEPARTMENT_OTHER)

## 2024-06-21 DIAGNOSIS — O365930 Maternal care for other known or suspected poor fetal growth, third trimester, not applicable or unspecified: Secondary | ICD-10-CM

## 2024-06-21 DIAGNOSIS — O43193 Other malformation of placenta, third trimester: Secondary | ICD-10-CM

## 2024-06-21 DIAGNOSIS — Z3A31 31 weeks gestation of pregnancy: Secondary | ICD-10-CM

## 2024-06-21 DIAGNOSIS — O10013 Pre-existing essential hypertension complicating pregnancy, third trimester: Secondary | ICD-10-CM | POA: Diagnosis not present

## 2024-06-21 MED ORDER — MAGNESIUM SULFATE BOLUS VIA INFUSION
6.0000 g | Freq: Once | INTRAVENOUS | Status: AC
  Administered 2024-06-21: 6 g via INTRAVENOUS
  Filled 2024-06-21: qty 1000

## 2024-06-21 MED ORDER — LACTATED RINGERS IV SOLN: INTRAVENOUS | Status: AC

## 2024-06-21 MED ORDER — MAGNESIUM SULFATE 40 GM/1000ML IV SOLN
2.0000 g/h | INTRAVENOUS | Status: DC
  Filled 2024-06-21: qty 1000

## 2024-06-21 NOTE — Progress Notes (Signed)
 Called about FHR decels noted during NST for this patient, had two decls lasting two minutes each, going to a nadir of 90 bpm from baseline of 150 bpm.  This happened twice in the last 20 minutes.  Patient is not feeling any contractions, no VB, no LOF.  Had UA dopplers today that showed intermittent AEDF.  Given decels and abnormal UA dopplers, will start patient on magnesium  sulfate for neuroprotection in case delivery is needed. Patient has been consented for cesarean section, has no questions currently.  NPO except for sips for now.  Will check CBC and CMET now.  Continue close monitoring.   Gloris Hugger, MD

## 2024-06-21 NOTE — Progress Notes (Signed)
 Patient ID: ANANDA CAYA, female   DOB: 04-03-2001, 23 y.o.   MRN: [id number] FACULTY PRACTICE ANTEPARTUM(COMPREHENSIVE) NOTE  Una YASLYN CUMBY is a 23 y.o. G2P0010 with Estimated Date of Delivery: 08/22/24   By  early ultrasound [redacted]w[redacted]d   who is admitted for FGR, severe, <1%, with AEDF.    Fetal presentation is breech. Length of Stay:  13  Days  Date of admission:06/08/2024  Subjective: No complaints Had some cramping last night, resolved with terbutaline  Patient reports the fetal movement as active. Patient reports uterine contraction  activity as none. Patient reports  vaginal bleeding as none. Patient describes fluid per vagina as None.  Vitals:  Blood pressure 125/86, pulse 79, temperature 98.6 F (37 C), temperature source Oral, resp. rate 16, height 5' 3 (1.6 m), weight 57.9 kg, last menstrual period 11/16/2023, SpO2 100%. Vitals:   06/20/24 2005 06/21/24 0514 06/21/24 0754 06/21/24 0755  BP: 127/82 125/80  125/86  Pulse: 92 78  79  Resp: 16 16  16   Temp: 98.5 F (36.9 C) 98.1 F (36.7 C)  98.6 F (37 C)  TempSrc: Oral Oral  Oral  SpO2: 100% 100% 100%   Weight:      Height:       Physical Examination:  General appearance - alert, well appearing, and in no distress Abdomen - soft, nontender, nondistended, no masses or organomegaly Fundal Height:  size less than dates Pelvic Exam:  examination not indicated Cervical Exam: Not evaluated. Extremities: extremities normal, atraumatic, no cyanosis or edema with DTRs 2+ bilaterally Membranes:intact  Fetal Monitoring:  Baseline: 150 bpm, Variability: Fair (1-6 bpm), Accelerations: Non-reactive but appropriate for gestational age, and Decelerations: Absent   equivocal  Labs:  No results found for this or any previous visit (from the past 24 hours).  Imaging Studies:    US  MFM UA CORD DOPPLER Result Date: 06/19/2024 ----------------------------------------------------------------------  OBSTETRICS REPORT                         (Signed Final 06/19/2024 02:50 pm) ---------------------------------------------------------------------- Patient Info  ID #:       [id number]                          D.O.B.:  2001/01/24 (22 yrs)(F)  Name:       [name]                Visit Date: 06/18/2024 07:53 am ---------------------------------------------------------------------- Performed By  Attending:        Nathanel Fetters      Secondary Phy.:    [facility]                    MD                                                              Care  Performed By:     Metta Ar          [facility]  Referred By:      [facility] MAU/Triage ---------------------------------------------------------------------- Orders  #  Description                           Code        Ordered By  1  US  MFM UA CORD DOPPLER                76820.02    CHARLIE PICKENS  2  US  MFM OB LIMITED                     76815.01    CHARLIE PICKENS ----------------------------------------------------------------------  #  Order #                     Accession #                Episode #  1  [id number]                   [id number]                 [id number]  2  [id number]                   [id number]                 [id number] ---------------------------------------------------------------------- Indications  Maternal care for known or suspected poor       O36.5930  fetal growth, third trimester, single or  unspecified fetus IUGR  Marginal insertion of umbilical cord affecting  O43.193  management of mother in third trimester  Encounter for antenatal screening,              Z36.9  unspecified  [redacted] weeks gestation of pregnancy                 Z3A.30 ---------------------------------------------------------------------- Fetal Evaluation  Num Of Fetuses:          1  Fetal Heart Rate(bpm):   140  Cardiac Activity:        Observed  Presentation:            Breech  Placenta:                Fundal   P. Cord Insertion:       Marg insertion previously seen  Amniotic Fluid  AFI FV:      Within normal limits  AFI Sum(cm)     %Tile       Largest Pocket(cm)  10.1            15          3.1  RUQ(cm)       RLQ(cm)       LUQ(cm)        LLQ(cm)  2             2.2           3.1            2.8 ---------------------------------------------------------------------- Biometry  LV:        2.9  mm ---------------------------------------------------------------------- OB History  Gravidity:    2          SAB:   1 ---------------------------------------------------------------------- Gestational Age  LMP:           30w 5d        Date:  11/16/23  EDD:   08/22/24  Best:          30w 5d     Det. By:  LMP  (11/16/23)          EDD:   08/22/24 ---------------------------------------------------------------------- Anatomy  Cranium:               Appears normal         Kidneys:                Appear normal  Diaphragm:             Appears normal         Bladder:                Appears normal  Stomach:               Appears normal, left                         sided ---------------------------------------------------------------------- Doppler - Fetal Vessels  Umbilical Artery    S/D    %tile      RI    %tile      PI    %tile     PSV    ADFV    RDFV                                                     (cm/s)    6.1   > 97.5     0.8   > 97.5     1.6   > 97.5       34      No      No ---------------------------------------------------------------------- Impression  Limited exam to assess the UAD and amniotic fluid due to  fetal growth restriction EFW <1%  The UAD are elevated without evidence AEDF or REDF  Good fetal movement and amniotic fluid volume. ---------------------------------------------------------------------- Recommendations  Continue 2x weekly testing  Repeat growth in 1 week. ----------------------------------------------------------------------              Nathanel Fetters, MD Electronically Signed Final Report    06/19/2024 02:50 pm ----------------------------------------------------------------------   US  MFM OB LIMITED Result Date: 06/19/2024 ----------------------------------------------------------------------  OBSTETRICS REPORT                        (Signed Final 06/19/2024 02:50 pm) ---------------------------------------------------------------------- Patient Info  ID #:       [id number]                          D.O.B.:  [birth year] (22 yrs)(F)  Name:       [name]                Visit Date: 06/18/2024 07:53 am ---------------------------------------------------------------------- Performed By  Attending:        Nathanel Fetters      Secondary Phy.:    [facility]                    MD  Care  Performed By:     Metta Ar          [facility]  Referred By:      [facility] MAU/Triage ---------------------------------------------------------------------- Orders  #  Description                           Code        Ordered By  1  US  MFM UA CORD DOPPLER                76820.02    CHARLIE PICKENS  2  US  MFM OB LIMITED                     76815.01    CHARLIE PICKENS ----------------------------------------------------------------------  #  Order #                     Accession #                Episode #  1  [id number]                   [id number]                 [id number]  2  [id number]                   [id number]                 [id number] ---------------------------------------------------------------------- Indications  Maternal care for known or suspected poor       O36.5930  fetal growth, third trimester, single or  unspecified fetus IUGR  Marginal insertion of umbilical cord affecting  O43.193  management of mother in third trimester  Encounter for antenatal screening,              Z36.9  unspecified  [redacted] weeks gestation of pregnancy                 Z3A.30  ---------------------------------------------------------------------- Fetal Evaluation  Num Of Fetuses:          1  Fetal Heart Rate(bpm):   140  Cardiac Activity:        Observed  Presentation:            Breech  Placenta:                Fundal  P. Cord Insertion:       Marg insertion previously seen  Amniotic Fluid  AFI FV:      Within normal limits  AFI Sum(cm)     %Tile       Largest Pocket(cm)  10.1            15          3.1  RUQ(cm)       RLQ(cm)       LUQ(cm)        LLQ(cm)  2             2.2  3.1            2.8 ---------------------------------------------------------------------- Biometry  LV:        2.9  mm ---------------------------------------------------------------------- OB History  Gravidity:    2          SAB:   1 ---------------------------------------------------------------------- Gestational Age  LMP:           30w 5d        Date:  11/16/23                  EDD:   08/22/24  Best:          30w 5d     Det. By:  LMP  (11/16/23)          EDD:   08/22/24 ---------------------------------------------------------------------- Anatomy  Cranium:               Appears normal         Kidneys:                Appear normal  Diaphragm:             Appears normal         Bladder:                Appears normal  Stomach:               Appears normal, left                         sided ---------------------------------------------------------------------- Doppler - Fetal Vessels  Umbilical Artery    S/D    %tile      RI    %tile      PI    %tile     PSV    ADFV    RDFV                                                     (cm/s)    6.1   > 97.5     0.8   > 97.5     1.6   > 97.5       34      No      No ---------------------------------------------------------------------- Impression  Limited exam to assess the UAD and amniotic fluid due to  fetal growth restriction EFW <1%  The UAD are elevated without evidence AEDF or REDF  Good fetal movement and amniotic fluid volume.  ---------------------------------------------------------------------- Recommendations  Continue 2x weekly testing  Repeat growth in 1 week. ----------------------------------------------------------------------              Nathanel Fetters, MD Electronically Signed Final Report   06/19/2024 02:50 pm ----------------------------------------------------------------------     Medications:  Scheduled  NIFEdipine   30 mg Oral BID   prenatal multivitamin  1 tablet Oral Q1200   sertraline   25 mg Oral Daily   sodium chloride  flush  3 mL Intravenous Q12H   triamcinolone  ointment   Topical BID   valACYclovir   500 mg Oral BID   I have reviewed the patient's current medications.  ASSESSMENT: G2P0010 [redacted]w[redacted]d  Estimated Date of Delivery: 08/22/24  FGR, severe, <1% AEDF cHTN   PLAN: MWF UAD Sonogram 9/3 to evaluate UAD + interval growth and likely little growth which would necessitate delivery Of course monitoring UAD + EFM  for fetal status in the interime BP well managed S/P BMZ Mag prophylaxis boost 4 hours prior to delivery if possible  Vonn VEAR Inch 06/21/2024,9:40 AM     Patient ID: [name], female   DOB: 28-Dec-2000, 23 y.o.   MRN: [id number]

## 2024-06-21 NOTE — Progress Notes (Signed)
 Maternal-Fetal Medicine Consultation Name: Taylor Jefferson FMW:[id number]  G2 P0010 at 31w 1d gestation. Patient was admitted on 06/08/24 (29w 2d) with diagnosis of chronic hypertension and severe fetal growth restriction.  Absent end-diastolic flow was seen on umbilical artery Doppler study. Patient takes nifedipine  XL 30 mg daily and her blood pressures are well-controlled. She does not have symptoms of severe features of preeclampsia.  She has exposure with the patient with syphilis but her recent RPR is nonreactive. Of note, on screening, she had increased open CMV IgG and IgM antibodies.  No history of fever rashes in this pregnancy.  Toxoplasmosis screening was negative.  She does not have gestational diabetes.  On cell-free fetal DNA screening, the risk of fetal aneuploidies are not increased.  On ultrasound performed on 06/09/2024, the estimated fetal weight in the abdominal circumference measurements were at the first percentiles.  At her most recent ultrasound, umbilical artery Doppler showed increased S/D ratio. Marginal cord insertion was seen at previous scan.  P/E (examined in US  room) Patient is comfortably lying in bed; not in distress. Abdomen: Soft gravid uterus; no tenderness. No pedal edema. NST reactive.     Ultrasound A limited ultrasound study was performed.  Amniotic fluid is normal and good fetal activity seen.  Breech presentation. Umbilical artery Doppler showed intermittent absent end-diastolic flow.  Antenatal testing is reassuring BPP 8/8. No evidence of hepatosplenomegaly.  No abdominal or hepatic calcifications.  Intracranial structures appear normal with no evidence of ventriculomegaly or periventricular calcifications.  Placenta appears normal. No obvious ultrasound evidence of fetal cytomegalovirus infection.  Our concerns include: Chronic hypertension with severe fetal growth restriction I discussed the importance of abnormal Doppler studies.  Absent  end-diastolic flow is consistent with 50% placental insufficiency and is associated with increased perinatal mortality and morbidity. Most common cause of severe fetal growth restriction is placental insufficiency that is related to chronic hypertension. I discussed frequency of antenatal testing and I recommended twice weekly umbilical artery Doppler studies and BPP. I discussed the importance of good blood pressure control to prevent maternal adverse outcomes. Timing of delivery will be based on antenatal testing and interval growth.  Postiive IgM CMV serology Positive IgM should be followed by IgG avidity test that will give information on the timing of infection. If IgG avidity is high, we can safely treattconclude that the infection would have occurred more than 4 to 6 months ago and it is still possible that she could have had infection in this pregnancy. We recommend neonatal evaluation to rule out CMV infection. We do not recommend antenatal antiviral treatment as studies have shown no benefit.  Recommendations -Twice-weekly BPP and UA Doppler studies. -Fetal growth assessment on Thu, 06/24/24. -If AEDV persists, we recommend delivery at 33 or 34- weeks' gestation. -Consent for emergency cesarean section.  Consultation including face-to-face (more than 50%) counseling 50 minutes.

## 2024-06-21 NOTE — H&P (Deleted)
 Maternal-Fetal Medicine Consultation Name: Taylor Jefferson FMW:[id number]  G2 P0010 at 31w 1d gestation. Patient was admitted on 06/08/24 (29w 2d) with diagnosis of chronic hypertension and severe fetal growth restriction.  Absent end-diastolic flow was seen on umbilical artery Doppler study. Patient takes nifedipine  XL 30 mg daily and her blood pressures are well-controlled. She does not have symptoms of severe features of preeclampsia.  She has exposure with the patient with syphilis but her recent RPR is nonreactive. Of note, on screening, she had increased open CMV IgG and IgM antibodies.  No history of fever rashes in this pregnancy.  Toxoplasmosis screening was negative.  She does not have gestational diabetes.  On cell-free fetal DNA screening, the risk of fetal aneuploidies are not increased.  On ultrasound performed on 06/09/2024, the estimated fetal weight in the abdominal circumference measurements were at the first percentiles.  At her most recent ultrasound, umbilical artery Doppler showed increased S/D ratio. Marginal cord insertion was seen at previous scan.  P/E (examined in US  room) Patient is comfortably lying in bed; not in distress. Abdomen: Soft gravid uterus; no tenderness. No pedal edema. NST reactive.     Ultrasound A limited ultrasound study was performed.  Amniotic fluid is normal and good fetal activity seen.  Breech presentation. Umbilical artery Doppler showed intermittent absent end-diastolic flow.  Antenatal testing is reassuring BPP 8/8. No evidence of hepatosplenomegaly.  No abdominal or hepatic calcifications.  Intracranial structures appear normal with no evidence of ventriculomegaly or periventricular calcifications.  Placenta appears normal. No obvious ultrasound evidence of fetal cytomegalovirus infection.  Our concerns include: Chronic hypertension with severe fetal growth restriction I discussed the importance of abnormal Doppler studies.  Absent  end-diastolic flow is consistent with 50% placental insufficiency and is associated with increased perinatal mortality and morbidity. Most common cause of severe fetal growth restriction is placental insufficiency that is related to chronic hypertension. I discussed frequency of antenatal testing and I recommended twice weekly umbilical artery Doppler studies and BPP. I discussed the importance of good blood pressure control to prevent maternal adverse outcomes. Timing of delivery will be based on antenatal testing and interval growth.  Postiive IgM CMV serology Positive IgM should be followed by IgG avidity test that will give information on the timing of infection. If IgG avidity is high, we can safely treattconclude that the infection would have occurred more than 4 to 6 months ago and it is still possible that she could have had infection in this pregnancy. We recommend neonatal evaluation to rule out CMV infection. We do not recommend antenatal antiviral treatment as studies have shown no benefit.  Recommendations -Twice-weekly BPP and UA Doppler studies. -Fetal growth assessment on Thu, 06/24/24. -If AEDV persists, we recommend delivery at 33 or 34- weeks' gestation. -Consent for emergency cesarean section.  Consultation including face-to-face (more than 50%) counseling 50 minutes.

## 2024-06-21 NOTE — H&P (Deleted)
 Maternal-Fetal Medicine Consultation Name: Nicholas Ossa FMW:[id number]  G2 P0010 at 31w 1d gestation. Patient was admitted on 06/08/24 (29w 2d) with diagnosis of chronic hypertension and severe fetal growth restriction.  Absent end-diastolic flow was seen on umbilical artery Doppler study. Patient takes nifedipine  XL 30 mg daily and her blood pressures are well-controlled. She does not have symptoms of severe features of preeclampsia.  She has exposure with the patient with syphilis but her recent RPR is nonreactive. Of note, on screening, she had increased open CMV IgG and IgM antibodies.  No history of fever rashes in this pregnancy.  Toxoplasmosis screening was negative.  She does not have gestational diabetes.  On cell-free fetal DNA screening, the risk of fetal aneuploidies are not increased.  On ultrasound performed on 06/09/2024, the estimated fetal weight in the abdominal circumference measurements were at the first percentiles.  At her most recent ultrasound, umbilical artery Doppler showed increased S/D ratio. Marginal cord insertion was seen at previous scan.  P/E (examined in US  room) Patient is comfortably lying in bed; not in distress. Abdomen: Soft gravid uterus; no tenderness. No pedal edema. NST reactive.    Labs including liver enzymes, creatinine and platelets are within normal range.  Protein/creatinine ratio is not increased.  Ultrasound A limited ultrasound study was performed.  Amniotic fluid is normal and good fetal activity seen.  Breech presentation. Umbilical artery Doppler showed intermittent absent end-diastolic flow.  Antenatal testing is reassuring BPP 8/8. No evidence of hepatosplenomegaly.  No abdominal or hepatic calcifications.  Intracranial structures appear normal with no evidence of ventriculomegaly or periventricular calcifications.  Placenta appears normal. No obvious ultrasound evidence of fetal cytomegalovirus infection.  Our concerns  include: Chronic hypertension with severe fetal growth restriction I discussed the importance of abnormal Doppler studies.  Absent end-diastolic flow is consistent with 50% placental insufficiency and is associated with increased perinatal mortality and morbidity. Most common cause of severe fetal growth restriction is placental insufficiency that is related to chronic hypertension. I discussed frequency of antenatal testing and I recommended twice weekly umbilical artery Doppler studies and BPP. I discussed the importance of good blood pressure control to prevent maternal adverse outcomes. Timing of delivery will be based on antenatal testing and interval growth.  Postiive IgM CMV serology Positive IgM should be followed by IgG avidity test that will give information on the timing of infection. If IgG avidity is high, we can safely treattconclude that the infection would have occurred more than 4 to 6 months ago and it is still possible that she could have had infection in this pregnancy. We recommend neonatal evaluation to rule out CMV infection. We do not recommend antenatal antiviral treatment as studies have shown no benefit.  Recommendations -Twice-weekly BPP and UA Doppler studies. -Fetal growth assessment on Thu, 06/24/24. -If AEDV persists, we recommend delivery at 33 or 34- weeks' gestation. -Consent for emergency cesarean section.  Consultation including face-to-face (more than 50%) counseling 50 minutes.

## 2024-06-22 ENCOUNTER — Encounter: Admitting: Obstetrics & Gynecology

## 2024-06-22 ENCOUNTER — Other Ambulatory Visit

## 2024-06-22 LAB — COMPREHENSIVE METABOLIC PANEL WITH GFR
ALT: 25 U/L (ref 0–44)
AST: 23 U/L (ref 15–41)
Albumin: 2.9 g/dL — ABNORMAL LOW (ref 3.5–5.0)
Alkaline Phosphatase: 147 U/L — ABNORMAL HIGH (ref 38–126)
Anion gap: 10 (ref 5–15)
BUN: 9 mg/dL (ref 6–20)
CO2: 19 mmol/L — ABNORMAL LOW (ref 22–32)
Calcium: 8.8 mg/dL — ABNORMAL LOW (ref 8.9–10.3)
Chloride: 105 mmol/L (ref 98–111)
Creatinine, Ser: 0.62 mg/dL (ref 0.44–1.00)
GFR, Estimated: >60 mL/min (ref >60)
Glucose, Bld: 84 mg/dL (ref 70–99)
Potassium: 3.9 mmol/L (ref 3.5–5.1)
Sodium: 134 mmol/L — ABNORMAL LOW (ref 135–145)
Total Bilirubin: 0.4 mg/dL (ref 0.0–1.2)
Total Protein: 6.7 g/dL (ref 6.5–8.1)

## 2024-06-22 LAB — CBC
HCT: 36.8 % (ref 36.0–46.0)
Hemoglobin: 13 g/dL (ref 12.0–15.0)
MCH: 31 pg (ref 26.0–34.0)
MCHC: 35.3 g/dL (ref 30.0–36.0)
MCV: 87.6 fL (ref 80.0–100.0)
Platelets: 161 K/uL (ref 150–400)
RBC: 4.2 MIL/uL (ref 3.87–5.11)
RDW: 12.5 % (ref 11.5–15.5)
WBC: 8.5 K/uL (ref 4.0–10.5)
nRBC: 0 % (ref 0.0–0.2)

## 2024-06-22 MED ORDER — ONDANSETRON HCL 4 MG/2ML IJ SOLN: 4.0000 mg | Freq: Four times a day (QID) | INTRAMUSCULAR | Status: DC | PRN

## 2024-06-22 MED ORDER — NIFEDIPINE ER OSMOTIC RELEASE 60 MG PO TB24
60.0000 mg | ORAL_TABLET | Freq: Two times a day (BID) | ORAL | Status: DC
  Administered 2024-06-22 – 2024-06-24 (×5): 60 mg via ORAL
  Filled 2024-06-22 (×5): qty 1

## 2024-06-22 MED ORDER — LACTATED RINGERS IV BOLUS
500.0000 mL | Freq: Once | INTRAVENOUS | Status: AC
  Administered 2024-06-22: 500 mL via INTRAVENOUS

## 2024-06-22 NOTE — Progress Notes (Signed)
 Patient ID: Taylor Jefferson, female   DOB: 04-10-2001, 23 y.o.   MRN: [id number] FACULTY PRACTICE ANTEPARTUM(COMPREHENSIVE) NOTE  Taylor Jefferson is a 23 y.o. G2P0010 at [redacted]w[redacted]d   who is admitted for FGR, severe, <1%, with AEDF.    Fetal presentation is breech. Length of Stay:  14  Days  Date of admission:06/08/2024  Subjective: Had some FHR decels yesterday, and received magnesium  sulfate for neuroprotection. Reassuring tracing overnight. Patient reports the fetal movement as active. Patient reports uterine contraction  activity as none. Patient reports  vaginal bleeding as none. Patient describes fluid per vagina as None.  Vitals:  Blood pressure (!) 142/94, pulse 87, temperature 98.1 F (36.7 C), temperature source Oral, resp. rate 16, height 5' 3 (1.6 m), weight 57.9 kg, last menstrual period 11/16/2023, SpO2 100%. Vitals:   06/21/24 2029 06/21/24 2339 06/22/24 0003 06/22/24 0023  BP: (!) 133/94 (!) 148/91 (!) 84/45 (!) 142/94  Pulse: 84 82 86 87  Resp: 17 16    Temp: 97.8 F (36.6 C) 98.1 F (36.7 C)    TempSrc: Oral Oral    SpO2: 99% 100%    Weight:      Height:       Physical Examination: General appearance - alert, well appearing, and in no distress Abdomen - soft, nontender, nondistended, no masses or organomegaly Fundal Height:  size less than dates Pelvic Exam:  examination not indicated Cervical Exam: Not evaluated. Extremities: extremities normal, atraumatic, no cyanosis or edema with DTRs 2+ bilaterally Membranes:intact  Fetal Monitoring:  Baseline: 150 bpm, Variability: Fair (1-6 bpm), Accelerations: Non-reactive but appropriate for gestational age, and Decelerations: Absent   equivocal  Labs:  Results for orders placed or performed during the hospital encounter of 06/08/24 (from the past 24 hours)  CBC   Collection Time: 06/21/24 11:53 PM  Result Value Ref Range   WBC 8.5 4.0 - 10.5 K/uL   RBC 4.20 3.87 - 5.11 MIL/uL   Hemoglobin 13.0 12.0 - 15.0 g/dL    HCT 63.1 63.9 - 53.9 %   MCV 87.6 80.0 - 100.0 fL   MCH 31.0 26.0 - 34.0 pg   MCHC 35.3 30.0 - 36.0 g/dL   RDW 87.4 88.4 - 84.4 %   Platelets 161 150 - 400 K/uL   nRBC 0.0 0.0 - 0.2 %  Comprehensive metabolic panel   Collection Time: 06/21/24 11:53 PM  Result Value Ref Range   Sodium 134 (L) 135 - 145 mmol/L   Potassium 3.9 3.5 - 5.1 mmol/L   Chloride 105 98 - 111 mmol/L   CO2 19 (L) 22 - 32 mmol/L   Glucose, Bld 84 70 - 99 mg/dL   BUN 9 6 - 20 mg/dL   Creatinine, Ser 9.37 0.44 - 1.00 mg/dL   Calcium  8.8 (L) 8.9 - 10.3 mg/dL   Total Protein 6.7 6.5 - 8.1 g/dL   Albumin 2.9 (L) 3.5 - 5.0 g/dL   AST 23 15 - 41 U/L   ALT 25 0 - 44 U/L   Alkaline Phosphatase 147 (H) 38 - 126 U/L   Total Bilirubin 0.4 0.0 - 1.2 mg/dL   GFR, Estimated >39 >39 mL/min   Anion gap 10 5 - 15    Imaging Studies:    US  MFM UA CORD DOPPLER Result Date: 06/21/2024 ----------------------------------------------------------------------  OBSTETRICS REPORT                       (Signed Final 06/21/2024 02:23 pm) ----------------------------------------------------------------------  Patient Info  ID #:       [id number]                          D.O.B.:  02/11/2001 (22 yrs)(F)  Name:       Taylor Jefferson                Visit Date: 06/21/2024 07:17 am ---------------------------------------------------------------------- Performed By  Attending:        Fredia Fresh MD        Secondary Phy.:   [facility] OB [facility]ty                                                             Care  Performed By:     Jonette Nap        [facility]  Referred By:      [facility] MAU/Triage ---------------------------------------------------------------------- Orders  #  Description                           Code        Ordered By  1  US  MFM UA CORD DOPPLER                76820.02    Zaynab Chipman  2  US  MFM OB LIMITED                     23184.98    GLORIS HUGGER  ----------------------------------------------------------------------  #  Order #                     Accession #                Episode #  1  [id number]                   [id number]                 [id number]  2  [id number]                   [id number]                 [id number] ---------------------------------------------------------------------- Indications  Maternal care for known or suspected poor      O36.5930  fetal growth, third trimester, single or  unspecified fetus IUGR  Marginal insertion of umbilical cord affecting O43.193  management of mother in third trimester  Encounter for other antenatal screening        Z36.2  follow-up  [redacted] weeks gestation of pregnancy                Z3A.31 ---------------------------------------------------------------------- Fetal Evaluation  Num Of Fetuses:         1  Fetal Heart Rate(bpm):  154  Cardiac Activity:       Observed  Presentation:  Breech  Placenta:               Fundal  P. Cord Insertion:      Marg insertion previously seen  Amniotic Fluid  AFI FV:      Within normal limits  AFI Sum(cm)     %Tile       Largest Pocket(cm)  10.1            16          3.8  RUQ(cm)       RLQ(cm)       LUQ(cm)        LLQ(cm)  3.8           1.7           2.2            2.4 ---------------------------------------------------------------------- Biometry  LV:        2.7  mm ---------------------------------------------------------------------- OB History  Gravidity:    2          SAB:   1 ---------------------------------------------------------------------- Gestational Age  LMP:           31w 1d        Date:  11/16/23                  EDD:   08/22/24  Best:          31w 1d     Det. By:  LMP  (11/16/23)          EDD:   08/22/24 ---------------------------------------------------------------------- Anatomy  Cerebellum:            Appears normal         Kidneys:                Appear normal  Diaphragm:             Appears normal         Bladder:                Appears normal  Stomach:                Appears normal, left                         sided ---------------------------------------------------------------------- Doppler - Fetal Vessels  Umbilical Artery   S/D     %tile      RI    %tile      PI    %tile     PSV    ADFV    RDFV                                                     (cm/s)    6.1   > 97.5     0.8   > 97.5     1.5   > 97.5     39.6     Yes      No ---------------------------------------------------------------------- Impression  A limited ultrasound study was performed.  Amniotic fluid is  normal and good fetal activity seen.  Breech presentation.  Umbilical artery Doppler showed intermittent absent end-  diastolic flow.  Antenatal testing is reassuring BPP 8/8.  No evidence of hepatosplenomegaly.  No abdominal or  hepatic calcifications.  Intracranial structures appear normal  with no  evidence of ventriculomegaly or periventricular  calcifications.  Placenta appears normal.  No obvious ultrasound evidence of fetal cytomegalovirus  infection. ---------------------------------------------------------------------- Recommendations  Twice-weekly BPP and UA Doppler studies.  -Fetal growth assessment on Thu, 06/24/24.  -If AEDV persists, we recommend delivery at 33 or 34-  weeks' gestation. ----------------------------------------------------------------------                 Fredia Fresh, MD Electronically Signed Final Report   06/21/2024 02:23 pm ----------------------------------------------------------------------   US  MFM OB LIMITED Result Date: 06/21/2024 ----------------------------------------------------------------------  OBSTETRICS REPORT                       (Signed Final 06/21/2024 02:23 pm) ---------------------------------------------------------------------- Patient Info  ID #:       [id number]                          D.O.B.:  March 22, 2001 (22 yrs)(F)  Name:       [name]                Visit Date: 06/21/2024 07:17 am  ---------------------------------------------------------------------- Performed By  Attending:        Fredia Fresh MD        Secondary Phy.:   [facility] OB Specialty                                                             Care  Performed By:     Jonette Nap        [facility]  Referred By:      [facility] MAU/Triage ---------------------------------------------------------------------- Orders  #  Description                           Code        Ordered By  1  US  MFM UA CORD DOPPLER                76820.02    Sani Loiseau  2  US  MFM OB LIMITED                     23184.98    GLORIS HUGGER ----------------------------------------------------------------------  #  Order #                     Accession #                Episode #  1  [id number]                   [id number]                 [id number]  2  [id number]                   [id number]  [id number] ---------------------------------------------------------------------- Indications  Maternal care for known or suspected poor      O36.5930  fetal growth, third trimester, single or  unspecified fetus IUGR  Marginal insertion of umbilical cord affecting O43.193  management of mother in third trimester  Encounter for other antenatal screening        Z36.2  follow-up  [redacted] weeks gestation of pregnancy                Z3A.31 ---------------------------------------------------------------------- Fetal Evaluation  Num Of Fetuses:         1  Fetal Heart Rate(bpm):  154  Cardiac Activity:       Observed  Presentation:           Breech  Placenta:               Fundal  P. Cord Insertion:      Marg insertion previously seen  Amniotic Fluid  AFI FV:      Within normal limits  AFI Sum(cm)     %Tile       Largest Pocket(cm)  10.1            16          3.8  RUQ(cm)       RLQ(cm)       LUQ(cm)        LLQ(cm)  3.8           1.7           2.2            2.4  ---------------------------------------------------------------------- Biometry  LV:        2.7  mm ---------------------------------------------------------------------- OB History  Gravidity:    2          SAB:   1 ---------------------------------------------------------------------- Gestational Age  LMP:           31w 1d        Date:  11/16/23                  EDD:   08/22/24  Best:          31w 1d     Det. By:  LMP  (11/16/23)          EDD:   08/22/24 ---------------------------------------------------------------------- Anatomy  Cerebellum:            Appears normal         Kidneys:                Appear normal  Diaphragm:             Appears normal         Bladder:                Appears normal  Stomach:               Appears normal, left                         sided ---------------------------------------------------------------------- Doppler - Fetal Vessels  Umbilical Artery   S/D     %tile      RI    %tile      PI    %tile     PSV    ADFV    RDFV                                                     (  cm/s)    6.1   > 97.5     0.8   > 97.5     1.5   > 97.5     39.6     Yes      No ---------------------------------------------------------------------- Impression  A limited ultrasound study was performed.  Amniotic fluid is  normal and good fetal activity seen.  Breech presentation.  Umbilical artery Doppler showed intermittent absent end-  diastolic flow.  Antenatal testing is reassuring BPP 8/8.  No evidence of hepatosplenomegaly.  No abdominal or  hepatic calcifications.  Intracranial structures appear normal  with no evidence of ventriculomegaly or periventricular  calcifications.  Placenta appears normal.  No obvious ultrasound evidence of fetal cytomegalovirus  infection. ---------------------------------------------------------------------- Recommendations  Twice-weekly BPP and UA Doppler studies.  -Fetal growth assessment on Thu, 06/24/24.  -If AEDV persists, we recommend delivery at 33 or 34-  weeks'  gestation. ----------------------------------------------------------------------                 Fredia Fresh, MD Electronically Signed Final Report   06/21/2024 02:23 pm ----------------------------------------------------------------------   US  MFM UA CORD DOPPLER Result Date: 06/19/2024 ----------------------------------------------------------------------  OBSTETRICS REPORT                        (Signed Final 06/19/2024 02:50 pm) ---------------------------------------------------------------------- Patient Info  ID #:       [id number]                          D.O.B.:  02/24/2001 (22 yrs)(F)  Name:       [name]                Visit Date: 06/18/2024 07:53 am ---------------------------------------------------------------------- Performed By  Attending:        Nathanel Fetters      Secondary Phy.:    [facility] OB Specialty                    MD                                                              Care  Performed By:     Metta Ar          [facility]  Referred By:      [facility] MAU/Triage ---------------------------------------------------------------------- Orders  #  Description                           Code        Ordered By  1  US  MFM UA CORD DOPPLER                23179.97    CHARLIE PICKENS  2  US  MFM OB LIMITED  23184.98    CHARLIE PICKENS ----------------------------------------------------------------------  #  Order #                     Accession #                Episode #  1  [id number]                   [id number]                 [id number]  2  [id number]                   [id number]                 [id number] ---------------------------------------------------------------------- Indications  Maternal care for known or suspected poor       O36.5930  fetal growth, third trimester, single or  unspecified fetus IUGR  Marginal insertion of umbilical cord affecting  O43.193   management of mother in third trimester  Encounter for antenatal screening,              Z36.9  unspecified  [redacted] weeks gestation of pregnancy                 Z3A.30 ---------------------------------------------------------------------- Fetal Evaluation  Num Of Fetuses:          1  Fetal Heart Rate(bpm):   140  Cardiac Activity:        Observed  Presentation:            Breech  Placenta:                Fundal  P. Cord Insertion:       Marg insertion previously seen  Amniotic Fluid  AFI FV:      Within normal limits  AFI Sum(cm)     %Tile       Largest Pocket(cm)  10.1            15          3.1  RUQ(cm)       RLQ(cm)       LUQ(cm)        LLQ(cm)  2             2.2           3.1            2.8 ---------------------------------------------------------------------- Biometry  LV:        2.9  mm ---------------------------------------------------------------------- OB History  Gravidity:    2          SAB:   1 ---------------------------------------------------------------------- Gestational Age  LMP:           30w 5d        Date:  11/16/23                  EDD:   08/22/24  Best:          30w 5d     Det. By:  LMP  (11/16/23)          EDD:   08/22/24 ---------------------------------------------------------------------- Anatomy  Cranium:               Appears normal         Kidneys:                Appear normal  Diaphragm:             Appears normal  Bladder:                Appears normal  Stomach:               Appears normal, left                         sided ---------------------------------------------------------------------- Doppler - Fetal Vessels  Umbilical Artery    S/D    %tile      RI    %tile      PI    %tile     PSV    ADFV    RDFV                                                     (cm/s)    6.1   > 97.5     0.8   > 97.5     1.6   > 97.5       34      No      No ---------------------------------------------------------------------- Impression  Limited exam to assess the UAD and amniotic fluid due to   fetal growth restriction EFW <1%  The UAD are elevated without evidence AEDF or REDF  Good fetal movement and amniotic fluid volume. ---------------------------------------------------------------------- Recommendations  Continue 2x weekly testing  Repeat growth in 1 week. ----------------------------------------------------------------------              Nathanel Fetters, MD Electronically Signed Final Report   06/19/2024 02:50 pm ----------------------------------------------------------------------   US  MFM OB LIMITED Result Date: 06/19/2024 ----------------------------------------------------------------------  OBSTETRICS REPORT                        (Signed Final 06/19/2024 02:50 pm) ---------------------------------------------------------------------- Patient Info  ID #:       [id number]                          D.O.B.:  August 23, 2001 (22 yrs)(F)  Name:       [name]                Visit Date: 06/18/2024 07:53 am ---------------------------------------------------------------------- Performed By  Attending:        Nathanel Fetters      Secondary Phy.:    [facility] OB Specialty                    MD                                                              Care  Performed By:     Metta Ar          [facility]  Referred By:      [facility] MAU/Triage ---------------------------------------------------------------------- Orders  #  Description                           Code        Ordered By  1  US  MFM UA CORD DOPPLER                76820.02    CHARLIE PICKENS  2  US  MFM OB LIMITED                     76815.01    CHARLIE PICKENS ----------------------------------------------------------------------  #  Order #                     Accession #                Episode #  1  [id number]                   [id number]                 [id number]  2  [id number]                   [id number]                 [id number]  ---------------------------------------------------------------------- Indications  Maternal care for known or suspected poor       O36.5930  fetal growth, third trimester, single or  unspecified fetus IUGR  Marginal insertion of umbilical cord affecting  O43.193  management of mother in third trimester  Encounter for antenatal screening,              Z36.9  unspecified  [redacted] weeks gestation of pregnancy                 Z3A.30 ---------------------------------------------------------------------- Fetal Evaluation  Num Of Fetuses:          1  Fetal Heart Rate(bpm):   140  Cardiac Activity:        Observed  Presentation:            Breech  Placenta:                Fundal  P. Cord Insertion:       Marg insertion previously seen  Amniotic Fluid  AFI FV:      Within normal limits  AFI Sum(cm)     %Tile       Largest Pocket(cm)  10.1            15          3.1  RUQ(cm)       RLQ(cm)       LUQ(cm)        LLQ(cm)  2             2.2           3.1            2.8 ---------------------------------------------------------------------- Biometry  LV:        2.9  mm ---------------------------------------------------------------------- OB History  Gravidity:    2          SAB:   1 ---------------------------------------------------------------------- Gestational Age  LMP:           30w 5d        Date:  11/16/23  EDD:   08/22/24  Best:          30w 5d     Det. By:  LMP  (11/16/23)          EDD:   08/22/24 ---------------------------------------------------------------------- Anatomy  Cranium:               Appears normal         Kidneys:                Appear normal  Diaphragm:             Appears normal         Bladder:                Appears normal  Stomach:               Appears normal, left                         sided ---------------------------------------------------------------------- Doppler - Fetal Vessels  Umbilical Artery    S/D    %tile      RI    %tile      PI    %tile     PSV    ADFV    RDFV                                                      (cm/s)    6.1   > 97.5     0.8   > 97.5     1.6   > 97.5       34      No      No ---------------------------------------------------------------------- Impression  Limited exam to assess the UAD and amniotic fluid due to  fetal growth restriction EFW <1%  The UAD are elevated without evidence AEDF or REDF  Good fetal movement and amniotic fluid volume. ---------------------------------------------------------------------- Recommendations  Continue 2x weekly testing  Repeat growth in 1 week. ----------------------------------------------------------------------              Nathanel Fetters, MD Electronically Signed Final Report   06/19/2024 02:50 pm ----------------------------------------------------------------------     Medications:  Scheduled  NIFEdipine   60 mg Oral BID   prenatal multivitamin  1 tablet Oral Q1200   sertraline   25 mg Oral Daily   sodium chloride  flush  3 mL Intravenous Q12H   triamcinolone  ointment   Topical BID   valACYclovir   500 mg Oral BID   I have reviewed the patient's current medications.  ASSESSMENT: G2P0010 [redacted]w[redacted]d  Estimated Date of Delivery: 08/22/24  FGR, severe, <1% iAEDF cHTN   PLAN: MWF UAD Sonogram 9/3 to evaluate UAD + interval growth and likely little growth which would necessitate delivery Of course monitoring UAD + EFM for fetal status in the interime Increased Procardia  XL to 60 mg po bid S/P BMZ Mag prophylaxis boost 4 hours prior to delivery if possible Continue routine antenatal care  Gloris Hugger, MD 06/22/2024,7:53 AM

## 2024-06-23 ENCOUNTER — Encounter (HOSPITAL_COMMUNITY): Payer: Self-pay | Admitting: Licensed Clinical Social Worker

## 2024-06-23 ENCOUNTER — Ambulatory Visit (INDEPENDENT_AMBULATORY_CARE_PROVIDER_SITE_OTHER): Admitting: Licensed Clinical Social Worker

## 2024-06-23 ENCOUNTER — Inpatient Hospital Stay (HOSPITAL_BASED_OUTPATIENT_CLINIC_OR_DEPARTMENT_OTHER)

## 2024-06-23 DIAGNOSIS — Z3A31 31 weeks gestation of pregnancy: Secondary | ICD-10-CM

## 2024-06-23 DIAGNOSIS — O365930 Maternal care for other known or suspected poor fetal growth, third trimester, not applicable or unspecified: Secondary | ICD-10-CM

## 2024-06-23 DIAGNOSIS — O43193 Other malformation of placenta, third trimester: Secondary | ICD-10-CM | POA: Diagnosis not present

## 2024-06-23 DIAGNOSIS — F331 Major depressive disorder, recurrent, moderate: Secondary | ICD-10-CM | POA: Diagnosis not present

## 2024-06-23 LAB — CBC
HCT: 38.5 % (ref 36.0–46.0)
Hemoglobin: 13.6 g/dL (ref 12.0–15.0)
MCH: 31.1 pg (ref 26.0–34.0)
MCHC: 35.3 g/dL (ref 30.0–36.0)
MCV: 87.9 fL (ref 80.0–100.0)
Platelets: 164 K/uL (ref 150–400)
RBC: 4.38 MIL/uL (ref 3.87–5.11)
RDW: 12.4 % (ref 11.5–15.5)
WBC: 8.6 K/uL (ref 4.0–10.5)
nRBC: 0 % (ref 0.0–0.2)

## 2024-06-23 LAB — TYPE AND SCREEN
ABO/RH(D): B POS
Antibody Screen: NEGATIVE

## 2024-06-23 LAB — COMPREHENSIVE METABOLIC PANEL WITH GFR
ALT: 33 U/L (ref 0–44)
AST: 30 U/L (ref 15–41)
Albumin: 3 g/dL — ABNORMAL LOW (ref 3.5–5.0)
Alkaline Phosphatase: 162 U/L — ABNORMAL HIGH (ref 38–126)
Anion gap: 9 (ref 5–15)
BUN: 13 mg/dL (ref 6–20)
CO2: 20 mmol/L — ABNORMAL LOW (ref 22–32)
Calcium: 8.8 mg/dL — ABNORMAL LOW (ref 8.9–10.3)
Chloride: 105 mmol/L (ref 98–111)
Creatinine, Ser: 0.61 mg/dL (ref 0.44–1.00)
GFR, Estimated: >60 mL/min (ref >60)
Glucose, Bld: 71 mg/dL (ref 70–99)
Potassium: 3.6 mmol/L (ref 3.5–5.1)
Sodium: 134 mmol/L — ABNORMAL LOW (ref 135–145)
Total Bilirubin: 0.5 mg/dL (ref 0.0–1.2)
Total Protein: 6.9 g/dL (ref 6.5–8.1)

## 2024-06-23 LAB — CMV ANTIBODY, IGG (EIA): CMV Ab - IgG: >10 U/mL — ABNORMAL HIGH (ref 0.00–0.59)

## 2024-06-23 NOTE — Progress Notes (Signed)
 Virtual Visit via Video Note  I connected with Taylor Jefferson on 06/23/24 at  8:00 AM EDT by a video enabled telemedicine application and verified that I am speaking with the correct person using two identifiers.  Location: Patient: hospital Provider: home office   I discussed the limitations of evaluation and management by telemedicine and the availability of in person appointments. The patient expressed understanding and agreed to proceed.   I discussed the assessment and treatment plan with the patient. The patient was provided an opportunity to ask questions and all were answered. The patient agreed with the plan and demonstrated an understanding of the instructions.   The patient was advised to call back or seek an in-person evaluation if the symptoms worsen or if the condition fails to improve as anticipated.  I provided 30 minutes of non-face-to-face time during this encounter.   [name], LCSW   THERAPIST PROGRESS NOTE  Session Time: 8:00am-8:30am  Participation Level: Active  Behavioral Response: CasualAlertEuthymic  Type of Therapy: Individual Therapy  Treatment Goals addressed:  Goal: LTG: Reduce frequency, intensity, and duration of depression symptoms so that daily functioning is improved     Dates: Start:  10/21/23    Expected End:  04/19/24       Disciplines: Interdisciplinary, PROVIDER                    Goal: LTG: Increase coping skills to manage depression and improve ability to perform daily activities     Dates: Start:  10/21/23    Expected End:  04/19/24       Disciplines: Interdisciplinary, PROVIDER       ProgressTowards Goals: Progressing  Interventions: Motivational Interviewing  Summary: Taylor Jefferson is a 23 y.o. female who presents with MDD, moderate.   Suicidal/Homicidal: Nowithout intent/plan  Therapist Response: Taylor Jefferson engaged well in individual virtual session with clinician. Clinician utilized MI OARS to reflect and  summarize updates in thoughts, feelings, and interactions. Taylor Jefferson shared updates with pregnancy and noted that the baby will have a growth scan today and a decision will be made about the delivery date, either today, tomorrow, or in a few weeks. Clinician processed thoughts and feelings about having the baby at 7 months. Clinician also identified significant improvement in mood due to starting Zoloft . Clinician processed the importance of remaining on Zoloft  after the pregnancy to assist with possible Post Partum Depression and anxiety. Clinician processed updates with a new relationship. Clinician identified the importance of being able to focus on the baby and also give the relationship time to progress as it needs to. Taylor Jefferson shared that her new boyfriend has been staying at the hospital with her and has been very supportive. She also shared that she feels comfortable and loved. Relationship with mother has greatly improved and Taylor Jefferson shared she is so happy that she and her mom are truly best friends.   Plan: Return again in 1 weeks.  Diagnosis: MDD (major depressive disorder), recurrent episode, moderate (HCC)  Collaboration of Care: Other provider involved in patient's care AEB reviewed med notes  Patient/Guardian was advised Release of Information must be obtained prior to any record release in order to collaborate their care with an outside provider. Patient/Guardian was advised if they have not already done so to contact the registration department to sign all necessary forms in order for us  to release information regarding their care.   Consent: Patient/Guardian gives verbal consent for treatment and assignment of benefits for services  provided during this visit. Patient/Guardian expressed understanding and agreed to proceed.   [name] [place], LCSW 06/23/2024

## 2024-06-23 NOTE — Progress Notes (Signed)
 Patient ID: Taylor Jefferson, female   DOB: 2001-10-07, 23 y.o.   MRN: [id number] FACULTY PRACTICE ANTEPARTUM(COMPREHENSIVE) NOTE  [name] is a 23 y.o. G2P0010 at [redacted]w[redacted]d   who is admitted for FGR, severe, <1%, with AEDF.    Fetal presentation is breech. Length of Stay:  15  Days  Date of admission:06/08/2024  Subjective: Had some FHR decels yesterday, and received magnesium  sulfate for neuroprotection. Reassuring tracing overnight. Patient is doing well without complaints. She is anxious regarding today's ultrasound. Patient reports good fetal movement. Patient denies contractions, vaginal bleeding or leakage of fluid.   Vitals:  Blood pressure (!) 128/97, pulse 84, temperature 98.1 F (36.7 C), temperature source Oral, resp. rate 17, height 5' 3 (1.6 m), weight 57.9 kg, last menstrual period 11/16/2023, SpO2 100%. Vitals:   06/22/24 1539 06/22/24 2034 06/23/24 0027 06/23/24 0758  BP: 123/80 127/87 131/85 (!) 128/97  Pulse: 98 91 87 84  Resp: 16 17 17 17   Temp: 98.1 F (36.7 C) 98.4 F (36.9 C) 97.7 F (36.5 C) 98.1 F (36.7 C)  TempSrc: Oral Oral Oral Oral  SpO2: 99% 99% 100% 100%  Weight:      Height:       Physical Examination: General appearance - alert, well appearing, and in no distress Abdomen - soft, nontender, nondistended, no masses or organomegaly Fundal Height:  size less than dates Pelvic Exam:  examination not indicated Cervical Exam: Not evaluated. Extremities: extremities normal, atraumatic, no cyanosis or edema with DTRs 2+ bilaterally Membranes:intact  Fetal Monitoring:  Baseline: 135 bpm, Variability: Fair (1-6 bpm), Accelerations: Non-reactive but appropriate for gestational age, and Decelerations: Absent     Labs:  Results for orders placed or performed during the hospital encounter of 06/08/24 (from the past 24 hours)  Type and screen [facility]   Collection Time: 06/23/24  5:06 AM  Result Value Ref Range   ABO/RH(D) B POS     Antibody Screen NEG    Sample Expiration      06/26/2024,2359 Performed at [facility], [address] [place], [place] [address]   CBC   Collection Time: 06/23/24  5:08 AM  Result Value Ref Range   WBC 8.6 4.0 - 10.5 K/uL   RBC 4.38 3.87 - 5.11 MIL/uL   Hemoglobin 13.6 12.0 - 15.0 g/dL   HCT 61.4 63.9 - 53.9 %   MCV 87.9 80.0 - 100.0 fL   MCH 31.1 26.0 - 34.0 pg   MCHC 35.3 30.0 - 36.0 g/dL   RDW 87.5 88.4 - 84.4 %   Platelets 164 150 - 400 K/uL   nRBC 0.0 0.0 - 0.2 %  Comprehensive metabolic panel   Collection Time: 06/23/24  5:08 AM  Result Value Ref Range   Sodium 134 (L) 135 - 145 mmol/L   Potassium 3.6 3.5 - 5.1 mmol/L   Chloride 105 98 - 111 mmol/L   CO2 20 (L) 22 - 32 mmol/L   Glucose, Bld 71 70 - 99 mg/dL   BUN 13 6 - 20 mg/dL   Creatinine, Ser 9.38 0.44 - 1.00 mg/dL   Calcium  8.8 (L) 8.9 - 10.3 mg/dL   Total Protein 6.9 6.5 - 8.1 g/dL   Albumin 3.0 (L) 3.5 - 5.0 g/dL   AST 30 15 - 41 U/L   ALT 33 0 - 44 U/L   Alkaline Phosphatase 162 (H) 38 - 126 U/L   Total Bilirubin 0.5 0.0 - 1.2 mg/dL  GFR, Estimated >60 >60 mL/min   Anion gap 9 5 - 15    Imaging Studies:    US  MFM UA CORD DOPPLER Result Date: 06/21/2024 ----------------------------------------------------------------------  OBSTETRICS REPORT                       (Signed Final 06/21/2024 02:23 pm) ---------------------------------------------------------------------- Patient Info  ID #:       [id number]                          D.O.B.:  2001/02/08 (22 yrs)(F)  Name:       Taylor Jefferson                Visit Date: 06/21/2024 07:17 am ---------------------------------------------------------------------- Performed By  Attending:        Fredia Fresh MD        Secondary Phy.:   [facility]                                                             Care  Performed By:     Jonette Nap        [facility]   Referred By:      [facility] MAU/Triage ---------------------------------------------------------------------- Orders  #  Description                           Code        Ordered By  1  US  MFM UA CORD DOPPLER                76820.02    UGONNA ANYANWU  2  US  MFM OB LIMITED                     23184.98    GLORIS HUGGER ----------------------------------------------------------------------  #  Order #                     Accession #                Episode #  1  [id number]                   [id number]                 [id number]  2  [id number]                   [id number]                 [id number] ---------------------------------------------------------------------- Indications  Maternal care for known or suspected poor      O36.5930  fetal growth, third trimester, single or  unspecified fetus IUGR  Marginal insertion of umbilical cord affecting O43.193  management of mother in third trimester  Encounter for other antenatal screening  Z36.2  follow-up  [redacted] weeks gestation of pregnancy                Z3A.31 ---------------------------------------------------------------------- Fetal Evaluation  Num Of Fetuses:         1  Fetal Heart Rate(bpm):  154  Cardiac Activity:       Observed  Presentation:           Breech  Placenta:               Fundal  P. Cord Insertion:      Marg insertion previously seen  Amniotic Fluid  AFI FV:      Within normal limits  AFI Sum(cm)     %Tile       Largest Pocket(cm)  10.1            16          3.8  RUQ(cm)       RLQ(cm)       LUQ(cm)        LLQ(cm)  3.8           1.7           2.2            2.4 ---------------------------------------------------------------------- Biometry  LV:        2.7  mm ---------------------------------------------------------------------- OB History  Gravidity:    2          SAB:   1 ---------------------------------------------------------------------- Gestational Age  LMP:           31w 1d        Date:  11/16/23                  EDD:   08/22/24  Best:          31w 1d      Det. By:  LMP  (11/16/23)          EDD:   08/22/24 ---------------------------------------------------------------------- Anatomy  Cerebellum:            Appears normal         Kidneys:                Appear normal  Diaphragm:             Appears normal         Bladder:                Appears normal  Stomach:               Appears normal, left                         sided ---------------------------------------------------------------------- Doppler - Fetal Vessels  Umbilical Artery   S/D     %tile      RI    %tile      PI    %tile     PSV    ADFV    RDFV                                                     (cm/s)    6.1   > 97.5     0.8   > 97.5     1.5   > 97.5     39.6  Yes      No ---------------------------------------------------------------------- Impression  A limited ultrasound study was performed.  Amniotic fluid is  normal and good fetal activity seen.  Breech presentation.  Umbilical artery Doppler showed intermittent absent end-  diastolic flow.  Antenatal testing is reassuring BPP 8/8.  No evidence of hepatosplenomegaly.  No abdominal or  hepatic calcifications.  Intracranial structures appear normal  with no evidence of ventriculomegaly or periventricular  calcifications.  Placenta appears normal.  No obvious ultrasound evidence of fetal cytomegalovirus  infection. ---------------------------------------------------------------------- Recommendations  Twice-weekly BPP and UA Doppler studies.  -Fetal growth assessment on Thu, 06/24/24.  -If AEDV persists, we recommend delivery at 33 or 34-  weeks' gestation. ----------------------------------------------------------------------                 Fredia Fresh, MD Electronically Signed Final Report   06/21/2024 02:23 pm ----------------------------------------------------------------------   US  MFM OB LIMITED Result Date: 06/21/2024 ----------------------------------------------------------------------  OBSTETRICS REPORT                       (Signed  Final 06/21/2024 02:23 pm) ---------------------------------------------------------------------- Patient Info  ID #:       [id number]                          D.O.B.:  04-27-2001 (22 yrs)(F)  Name:       Taylor Jefferson                Visit Date: 06/21/2024 07:17 am ---------------------------------------------------------------------- Performed By  Attending:        Fredia Fresh MD        Secondary Phy.:   [facility]                                                             Care  Performed By:     Jonette Nap        [facility]  Referred By:      [facility] MAU/Triage ---------------------------------------------------------------------- Orders  #  Description                           Code        Ordered By  1  US  MFM UA CORD DOPPLER                76820.02    UGONNA ANYANWU  2  US  MFM OB LIMITED                     23184.98    GLORIS HUGGER ----------------------------------------------------------------------  #  Order #                     Accession #                Episode #  1  [id number]  [id number]                 [id number]  2  [id number]                   [id number]                 [id number] ---------------------------------------------------------------------- Indications  Maternal care for known or suspected poor      O36.5930  fetal growth, third trimester, single or  unspecified fetus IUGR  Marginal insertion of umbilical cord affecting O43.193  management of mother in third trimester  Encounter for other antenatal screening        Z36.2  follow-up  [redacted] weeks gestation of pregnancy                Z3A.31 ---------------------------------------------------------------------- Fetal Evaluation  Num Of Fetuses:         1  Fetal Heart Rate(bpm):  154  Cardiac Activity:       Observed  Presentation:           Breech  Placenta:               Fundal  P. Cord Insertion:      Marg insertion previously  seen  Amniotic Fluid  AFI FV:      Within normal limits  AFI Sum(cm)     %Tile       Largest Pocket(cm)  10.1            16          3.8  RUQ(cm)       RLQ(cm)       LUQ(cm)        LLQ(cm)  3.8           1.7           2.2            2.4 ---------------------------------------------------------------------- Biometry  LV:        2.7  mm ---------------------------------------------------------------------- OB History  Gravidity:    2          SAB:   1 ---------------------------------------------------------------------- Gestational Age  LMP:           31w 1d        Date:  11/16/23                  EDD:   08/22/24  Best:          31w 1d     Det. By:  LMP  (11/16/23)          EDD:   08/22/24 ---------------------------------------------------------------------- Anatomy  Cerebellum:            Appears normal         Kidneys:                Appear normal  Diaphragm:             Appears normal         Bladder:                Appears normal  Stomach:               Appears normal, left                         sided ---------------------------------------------------------------------- Doppler - Fetal Vessels  Umbilical Artery   S/D     %tile      RI    %  tile      PI    %tile     PSV    ADFV    RDFV                                                     (cm/s)    6.1   > 97.5     0.8   > 97.5     1.5   > 97.5     39.6     Yes      No ---------------------------------------------------------------------- Impression  A limited ultrasound study was performed.  Amniotic fluid is  normal and good fetal activity seen.  Breech presentation.  Umbilical artery Doppler showed intermittent absent end-  diastolic flow.  Antenatal testing is reassuring BPP 8/8.  No evidence of hepatosplenomegaly.  No abdominal or  hepatic calcifications.  Intracranial structures appear normal  with no evidence of ventriculomegaly or periventricular  calcifications.  Placenta appears normal.  No obvious ultrasound evidence of fetal cytomegalovirus  infection.  ---------------------------------------------------------------------- Recommendations  Twice-weekly BPP and UA Doppler studies.  -Fetal growth assessment on Thu, 06/24/24.  -If AEDV persists, we recommend delivery at 33 or 34-  weeks' gestation. ----------------------------------------------------------------------                 Fredia Fresh, MD Electronically Signed Final Report   06/21/2024 02:23 pm ----------------------------------------------------------------------     Medications:  Scheduled  NIFEdipine   60 mg Oral BID   prenatal multivitamin  1 tablet Oral Q1200   sertraline   25 mg Oral Daily   sodium chloride  flush  3 mL Intravenous Q12H   triamcinolone  ointment   Topical BID   valACYclovir   500 mg Oral BID   I have reviewed the patient's current medications.  ASSESSMENT: 23 yo G2P0010 [redacted]w[redacted]d  with CHTN and FGR, severe, <1%, iAEDF    PLAN: Follow up ultrasound with UAD today BP stable on procardia  60 BID Patient completed a course of BMZ Mag prophylaxis boost 4 hours prior to delivery if possible Continue routine antenatal care  Winton Felt, MD 06/23/2024,8:27 AM

## 2024-06-24 MED ORDER — MAGNESIUM SULFATE BOLUS VIA INFUSION
4.0000 g | Freq: Once | INTRAVENOUS | Status: AC
  Administered 2024-06-24: 4 g via INTRAVENOUS
  Filled 2024-06-24: qty 1000

## 2024-06-24 MED ORDER — LACTATED RINGERS IV BOLUS
1000.0000 mL | Freq: Once | INTRAVENOUS | Status: AC
  Administered 2024-06-24: 1000 mL via INTRAVENOUS

## 2024-06-24 MED ORDER — MAGNESIUM SULFATE 40 GM/1000ML IV SOLN
2.0000 g/h | INTRAVENOUS | Status: DC
  Administered 2024-06-24: 2 g/h via INTRAVENOUS
  Filled 2024-06-24: qty 1000

## 2024-06-24 MED ORDER — BETAMETHASONE SOD PHOS & ACET 6 (3-3) MG/ML IJ SUSP
12.0000 mg | Freq: Once | INTRAMUSCULAR | Status: AC
  Administered 2024-06-24: 12 mg via INTRAMUSCULAR
  Filled 2024-06-24: qty 5

## 2024-06-24 MED ORDER — SODIUM CHLORIDE 0.9 % IV SOLN: INTRAVENOUS | Status: DC

## 2024-06-24 NOTE — Discharge Summary (Addendum)
 Patient ID: Taylor Jefferson MRN: [id number] DOB/AGE: 22-09-2001 22 y.o.  Admit date: 06/08/2024 Discharge date: 06/24/2024  Admission Diagnoses:29 week 2 days with fetal growth restriction  Discharge Diagnoses: [redacted]w[redacted]d chronic hypertension with fetal growth restriction  Prenatal Procedures: NST, ultrasound, and BPP and dopplers  Consults: Neonatology, Maternal Fetal Medicine  Hospital Course:  This is a 23 y.o. G2P0010 with IUP at [redacted]w[redacted]d admitted for observation with fetal growth restriction and chronic hypertension admitted 8/19.  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 CHTN (dx on 8/6). 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    No leaking of fluid and no bleeding.  She was initially started on magnesium  sulfate for tocolysis and neuroprotection and also received betamethasone  x 2 doses.. She was seen by Neonatology during her stay.  She was observed, fetal heart rate monitoring has shown good variability but now having occasion variable decelerations.  BPP yesterday was 8/8 and elevated dopplers with no absent or reverse flow. Fetal EFW was 683 g. As delivery may be indicated soon and our NICU is full I contacted Dr. Burnard Gutta at [facility] who accepted transfer.  Discharge Exam: Temp:  [97.7 F (36.5 C)-98.4 F (36.9 C)] 97.7 F (36.5 C) (09/04 1025) Pulse Rate:  [81-109] 109 (09/04 1025) Resp:  [16-18] 18 (09/04 1025) BP: (114-137)/(77-98) 132/85 (09/04 1025) SpO2:  [98 %-100 %] 98 % (09/04 1025) Weight:  [57.3 kg] 57.3 kg (09/04 0900) Physical Examination: CONSTITUTIONAL: Well-developed, well-nourished female in no acute distress.  HENT:  Normocephalic, atraumatic, External right and left ear normal. Oropharynx is clear and moist EYES: Conjunctivae and EOM are normal. Pupils are equal, round, and reactive to light. No scleral icterus.  NECK: Normal range of motion, supple, no masses SKIN: Skin is warm and dry. No rash noted. Not diaphoretic. No erythema. No  pallor. NEUROLGIC: Alert and oriented to person, place, and time. Normal reflexes, muscle tone coordination. No cranial nerve deficit noted. PSYCHIATRIC: Normal mood and affect. Normal behavior. Normal judgment and thought content. CARDIOVASCULAR: Normal heart rate noted, regular rhythm RESPIRATORY: Effort and breath sounds normal, no problems with respiration noted MUSCULOSKELETAL: Normal range of motion. No edema and no tenderness.  ABDOMEN: Soft, nontender, nondistended, gravid.  Fetal Heart Rate A   Mode External  [applied] filed at 06/24/2024 0753  Baseline Rate (A) 145 bpm filed at 06/24/2024 0542  Variability 6-25 BPM filed at 06/24/2024 0542  Accelerations 10 x 10 filed at 06/24/2024 0542  Decelerations Variable filed at 06/24/2024 0542     Significant Diagnostic Studies:  Results for orders placed or performed during the hospital encounter of 06/08/24 (from the past week)  CBC   Collection Time: 06/20/24  4:19 AM  Result Value Ref Range   WBC 6.6 4.0 - 10.5 K/uL   RBC 4.15 3.87 - 5.11 MIL/uL   Hemoglobin 12.7 12.0 - 15.0 g/dL   HCT 63.4 63.9 - 53.9 %   MCV 88.0 80.0 - 100.0 fL   MCH 30.6 26.0 - 34.0 pg   MCHC 34.8 30.0 - 36.0 g/dL   RDW 87.5 88.4 - 84.4 %   Platelets 150 150 - 400 K/uL   nRBC 0.0 0.0 - 0.2 %  Comprehensive metabolic panel   Collection Time: 06/20/24  4:19 AM  Result Value Ref Range   Sodium 134 (L) 135 - 145 mmol/L   Potassium 3.6 3.5 - 5.1 mmol/L   Chloride 104 98 - 111 mmol/L   CO2 20 (L) 22 - 32 mmol/L  Glucose, Bld 61 (L) 70 - 99 mg/dL   BUN 10 6 - 20 mg/dL   Creatinine, Ser 9.38 0.44 - 1.00 mg/dL   Calcium  8.7 (L) 8.9 - 10.3 mg/dL   Total Protein 6.7 6.5 - 8.1 g/dL   Albumin 2.9 (L) 3.5 - 5.0 g/dL   AST 21 15 - 41 U/L   ALT 27 0 - 44 U/L   Alkaline Phosphatase 150 (H) 38 - 126 U/L   Total Bilirubin 0.4 0.0 - 1.2 mg/dL   GFR, Estimated >39 >39 mL/min   Anion gap 10 5 - 15  Type and screen [facility]   Collection  Time: 06/20/24  8:03 AM  Result Value Ref Range   ABO/RH(D) B POS    Antibody Screen NEG    Sample Expiration      06/23/2024,2359 Performed at [facility], [address], [place], [place] [address]   Cmv antibody, IgG (EIA)   Collection Time: 06/21/24 11:53 PM  Result Value Ref Range   CMV Ab - IgG >10.00 (H) 0.00 - 0.59 U/mL  CBC   Collection Time: 06/21/24 11:53 PM  Result Value Ref Range   WBC 8.5 4.0 - 10.5 K/uL   RBC 4.20 3.87 - 5.11 MIL/uL   Hemoglobin 13.0 12.0 - 15.0 g/dL   HCT 63.1 63.9 - 53.9 %   MCV 87.6 80.0 - 100.0 fL   MCH 31.0 26.0 - 34.0 pg   MCHC 35.3 30.0 - 36.0 g/dL   RDW 87.4 88.4 - 84.4 %   Platelets 161 150 - 400 K/uL   nRBC 0.0 0.0 - 0.2 %  Comprehensive metabolic panel   Collection Time: 06/21/24 11:53 PM  Result Value Ref Range   Sodium 134 (L) 135 - 145 mmol/L   Potassium 3.9 3.5 - 5.1 mmol/L   Chloride 105 98 - 111 mmol/L   CO2 19 (L) 22 - 32 mmol/L   Glucose, Bld 84 70 - 99 mg/dL   BUN 9 6 - 20 mg/dL   Creatinine, Ser 9.37 0.44 - 1.00 mg/dL   Calcium  8.8 (L) 8.9 - 10.3 mg/dL   Total Protein 6.7 6.5 - 8.1 g/dL   Albumin 2.9 (L) 3.5 - 5.0 g/dL   AST 23 15 - 41 U/L   ALT 25 0 - 44 U/L   Alkaline Phosphatase 147 (H) 38 - 126 U/L   Total Bilirubin 0.4 0.0 - 1.2 mg/dL   GFR, Estimated >39 >39 mL/min   Anion gap 10 5 - 15  Type and screen [facility]   Collection Time: 06/23/24  5:06 AM  Result Value Ref Range   ABO/RH(D) B POS    Antibody Screen NEG    Sample Expiration      06/26/2024,2359 Performed at [facility], [address] [place], [place] [address]   CBC   Collection Time: 06/23/24  5:08 AM  Result Value Ref Range   WBC 8.6 4.0 - 10.5 K/uL   RBC 4.38 3.87 - 5.11 MIL/uL   Hemoglobin 13.6 12.0 - 15.0 g/dL   HCT 61.4 63.9 - 53.9 %   MCV 87.9 80.0 - 100.0 fL   MCH 31.1 26.0 - 34.0 pg   MCHC 35.3 30.0 - 36.0 g/dL   RDW 87.5 88.4 - 84.4 %   Platelets 164 150 - 400 K/uL   nRBC 0.0 0.0 - 0.2 %   Comprehensive metabolic panel   Collection Time: 06/23/24  5:08  AM  Result Value Ref Range   Sodium 134 (L) 135 - 145 mmol/L   Potassium 3.6 3.5 - 5.1 mmol/L   Chloride 105 98 - 111 mmol/L   CO2 20 (L) 22 - 32 mmol/L   Glucose, Bld 71 70 - 99 mg/dL   BUN 13 6 - 20 mg/dL   Creatinine, Ser 9.38 0.44 - 1.00 mg/dL   Calcium  8.8 (L) 8.9 - 10.3 mg/dL   Total Protein 6.9 6.5 - 8.1 g/dL   Albumin 3.0 (L) 3.5 - 5.0 g/dL   AST 30 15 - 41 U/L   ALT 33 0 - 44 U/L   Alkaline Phosphatase 162 (H) 38 - 126 U/L   Total Bilirubin 0.5 0.0 - 1.2 mg/dL   GFR, Estimated >39 >39 mL/min   Anion gap 9 5 - 15    Discharge Condition: Stable  Disposition:  Discharge disposition: 51-Hospice/Medical Facility      Transfer to [facility] with Dr.Shell accepting. Magnesium  and rescue dose of betamethasone  ordered  Discharge Instructions     Discharge patient   Complete by: As directed    Discharge disposition: 51-Hospice/Medical Facility   Discharge patient date: 06/24/2024        Signed: Lynwood Solomons M.D. 06/24/2024, 1:25 PM

## 2024-06-27 NOTE — Procedures (Signed)
 Non Stress Test Procedure Note  Patient: Taylor Jefferson Gestational Age: [redacted]w[redacted]d Date: 06/27/2024  Indication: severe FGR   FHT: Baseline HR 145, moderate variability, positive accelerations, occasional variable decelerations  Tocometer: Quiet  Impression: Overall reassuring, plan to repeat NST in evening  Sherryle Greaves, MD PGY-3 Obstetrics and Gynecology Sun 06/27/2024 1:39 PM

## 2024-06-28 ENCOUNTER — Other Ambulatory Visit

## 2024-06-28 ENCOUNTER — Encounter: Admitting: Obstetrics & Gynecology

## 2024-06-28 NOTE — OR Surgeon (Signed)
 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  Staff:  Circulator: Mardy Mirza, RN Scrub Person: Briyit Mickael Mickael Resident/Fellow: Dorothyann Almarie Locket, MD   Anesthesia: Spinal  Antibiotics: 2g Cefazolin   DVT prophylaxis: SCDs   Drains: Foley catheter to straight drain   EBL: 375 cc IVF: 1000 cc  UOP: 200 cc clear urine at end of procedure   Specimen(s):  ID Type Source Tests Collected by Time  1 : Placenta Tissue Placenta TISSUE EXAM [place] A Grotegut, MD 06/28/2024 1708     Findings:  - Non-vigorous infant in breech presentation weighing 735 g at 1721 with Apgars of 7 and 8 at 1 and 5 minutes, respectively.  - Intact placenta with 3 vessel cord.  - Normal uterus, tubes and ovaries bilaterally.   Delivery Information:  Sex: female  Birth History  . Birth    Weight: 735 g  . Apgar    One: 7    Five: 8  . Delivery Method: C-Section, Low Transverse     Procedure:  The patient was taken to the operating room where anesthesia was found to be adequate. She was prepared and draped in the normal sterile fashion, including a vaginal prep with chlorhexidine , and placed in the dorsal supine position with a leftward tilt. A Pfannenstiel skin incision was made with the scalpel and carried through to the underlying  layer of fascia. The fascia was incised in the midline and extended laterally with the Mayo scissors. The superior aspect of the fascial incision was grasped with the Kocher clamps, elevated and the underlying rectus muscles dissected off sharply. Attention was then turned to the inferior aspect of the fascial incision, which, in a similar fashion, was grasped and tented up with the Kocher clamps. The underly muscles were dissected off in a similar fashion. The rectus muscles were then separated in the midline, and the peritoneum identified, and entered bluntly. The piriformis muscles were dissected sharply off of the midline and the rectus muscles transected partially with the bovie to allow space for delivery. The peritoneal incision was extended superiorly and inferiorly with good visualization of the bladder. The bladder blade was then inserted.  A low transverse uterine incision was made with the scalpel well above the bladder reflection and extended cephalad and caudad. The bladder blade was removed and the presenting fetal part was delivered through the hysterotomy and the infant was delivered using the usual breech maneuvers. . The infant was non-vigorous and cord clamping was not delayed, the cord was subsequently clamped and cut and the infant handed off to waiting NICU team. Cord segment also collected to send for cord gases.   The placenta was then removed with gentle traction on the cord and uterine massage. The uterus was then exteriorized and cleared of all clots and debris. The uterine incision was repaired with a 0  Vicryl in a running locked fashion with an additional figure-of-eight stitch of the same suture to achieve excellent hemostasis in the center of the hysterotomy. The uterus was returned to the abdominal cavity. The gutters were cleared of all clots. The uterine incision was reinspected and found to be hemostatic.   The fascia was elevated and the rectus muscles were noted to be  oozing from several raw edges. Hemostasis was achieved with bovie electrocautery. Subsequently, the rectus muscles and subcutaneous tissues were found to be hemostatic. The fascia was closed with 0 Vicryl in a running fashion. The subcutaneous tissues were then irrigated and bovie electrosurgery used to complete hemostasis. A running layer of 2-0 Plaingut used to reapproximate the subcutaneous tissue. The skin was closed with 3-0 monocryl.   Sponge, lap, needle, and instrument counts were correct per the OR staff. The patient tolerated the procedure well and was taken to the recovery room in stable condition. She will be admitted to MB unit for her post-operative care.   Complications: none   Disposition: To PACU in stable condition.   Candy Bustle, MD Obstetrics & Gynecology, PGY-4 [facility]   Note above reviewed and edited by myself. I was present and scrubbed for entirety of procedure.  Lionel Fish, MD  Department of Obstetrics and Gynecology Fellow PGY-5, Division of Maternal Fetal Medicine

## 2024-06-28 NOTE — Progress Notes (Addendum)
 Decision Note  Patient is 22yo P0010 [redacted]w[redacted]d admitted under antepartum service with severe FGR <1% and cHTN - on procardia  60 BID. Dopplers today concerning for reversed EDF. Breech presentation. Patient also complaining of LOF: SSE neg pooling, neg nitrazine. Ferning also neg on microscopic survey. Discussed new findings with patient and plan for delivery via primary CD in setting of breech presentation. Patient last ate around 8am - for CD 4pm. NPO until then. OR and anesthesia teams to be made aware.

## 2024-06-28 NOTE — Procedures (Signed)
 Non Stress Test Procedure Note  Patient: Taylor Jefferson Gestational Age: [redacted]w[redacted]d Date: 06/28/2024  Indication: severe FGR  FHT: Baseline HR 135, moderate variability, positive accelerations, negative decelerations   Tocometer: Quiet  Impression: Reactive  Sherryle Greaves, MD PGY-3 Obstetrics and Gynecology Mon 06/28/2024 2:03 PM

## 2024-06-28 NOTE — Anesthesia Preprocedure Evaluation (Addendum)
 Patient: Taylor Jefferson  Procedure Information     Date/Time: 06/28/24 1600   Procedure: CESAREAN SECTION   Location: [facility] L&D OR 02 / [facility] L&D OR   Surgeons: [place] A Grotegut, MD       Relevant Problems  OBSTETRIC HISTORY AND PRENATAL CARE  (+) [redacted] weeks gestation of pregnancy (CMD)    BP 125/87 (BP Location: Left arm, Patient Position: Lying)   Pulse 88   Temp 98.3 F (36.8 C) (Oral)   Resp 18   Ht 1.6 m (5' 3)   Wt 57.3 kg (126 lb 5.2 oz)   LMP 11/16/2023   SpO2 100%   BMI 22.38 kg/m    Clinical information reviewed:  Allergies  Problems      Anesthesia Evaluation  Anesthesia History: Patient has no history of anesthetic complications.  PONV Predictive Score (Scale 0-5): (+) Female Apfel risk score: 1 Exercise Tolerance: good Respiratory: Patient has asthma (uses albuterol , hasn't used it in a few months). Additional comments: Nicotine and MJ prior to pregnancy  Cardiovascular: Patient has high blood pressure (chtn on procardia  last dose today). Patient has no angina/chest pain.  Neurological: Patient has headaches.  Infectious Disease: Additional comments: On valtrex  Renal/Gastrointestinal: Additional comments: PRN zofran  during pregnancy, used it last about a week ago Genitourinary: negative renal ROS.  Endocrine: negative endocrine ROS.  Hematology/Oncology: negative hematology/oncology ROS.  Psych: Additional comments: MDD, ODD, hx suicide attempt On zoloft     Physical Exam  Airway  Mallampati: II TM Distance (FB): 3 Oral Aperture (FB): 3 Cardiovascular  Rhythm: regular Rate: normal Dental - normal exam Comments: Tongue ring previously removed Pulmonary  breath sounds clear to auscultation   Anesthesia Plan  Review Preop documentation reviewed: H&P reviewed, Surgeon's Note Reviewed, Blood Availability Reviewed, NPO Status Reviewed, Preop Vitals Reviewed, Previous Anesthesia Records Reviewed and Periop Tests and Results  Reviewed Comments: [redacted]w[redacted]d admitted to antepartum with pregnancy complicated by FGR (<1%ile), cHTN, +CMV, HSV, syphillis exposure in July 2025 s/p ppx with penicillin, dopplers now with aEDF, presenting for primary c/s for breech presentation.  She ate breakfast around 0800 today. MFM team feels patient and fetus are currently stable enough to await NPO time, which is around 4pm today. Discussed that if clinical situation changes we will certainly go to OR sooner regardless of NPO time if MFM team determines there is clinical urgency.   Labs rechecked this morning, plt stable, isolated ALT elevation to 60s, normal AST, normal fibrinogen, normal Cr.  Plan for neuraxial. Hx of SI, ODD, MDD, would therefore try to avoid ketamine, benzos, psychotropic meds that could affect her cognition, unless clinically necessary.     Plan ASA score: 2  Anesthesia type: regional  Informed Consent Anesthetic plan and risks discussed with patient. Use of blood products discussed with patient whom consented to blood products. Plan discussed with fellow and resident.

## 2024-06-28 NOTE — Nursing Note (Signed)
 Pt transferred to 1129. Report given to [facility]

## 2024-06-29 ENCOUNTER — Encounter (HOSPITAL_COMMUNITY): Payer: Self-pay | Admitting: Licensed Clinical Social Worker

## 2024-06-29 ENCOUNTER — Ambulatory Visit (INDEPENDENT_AMBULATORY_CARE_PROVIDER_SITE_OTHER): Admitting: Licensed Clinical Social Worker

## 2024-06-29 DIAGNOSIS — F330 Major depressive disorder, recurrent, mild: Secondary | ICD-10-CM | POA: Diagnosis not present

## 2024-06-29 NOTE — Progress Notes (Signed)
 Virtual Visit via Video Note  I connected with Taylor Jefferson on 06/29/24 at  8:00 AM EDT by a video enabled telemedicine application and verified that I am speaking with the correct person using two identifiers.  [facility] Provider: home office   I discussed the limitations of evaluation and management by telemedicine and the availability of in person appointments. The patient expressed understanding and agreed to proceed.   I discussed the assessment and treatment plan with the patient. The patient was provided an opportunity to ask questions and all were answered. The patient agreed with the plan and demonstrated an understanding of the instructions.   The patient was advised to call back or seek an in-person evaluation if the symptoms worsen or if the condition fails to improve as anticipated.  I provided 50 minutes of non-face-to-face time during this encounter.   [name], LCSW   THERAPIST PROGRESS NOTE  Session Time: 8:00am-8:50am  Participation Level: Active  Behavioral Response: Fairly GroomedAlertEuthymic  Type of Therapy: Individual Therapy  Treatment Goals addressed:  Goal: LTG: Reduce frequency, intensity, and duration of depression symptoms so that daily functioning is improved     Dates: Start:  10/21/23    Expected End:  04/19/24       Disciplines: Interdisciplinary, PROVIDER                    Goal: LTG: Increase coping skills to manage depression and improve ability to perform daily activities     Dates: Start:  10/21/23    Expected End:  04/19/24       Disciplines: Interdisciplinary, PROVIDER       ProgressTowards Goals: Progressing  Interventions: Supportive  Summary: Taylor Jefferson is a 23 y.o. female who presents with MDD, mild.   Suicidal/Homicidal: Nowithout intent/plan  Therapist Response: Valen engaged well in individual virtual session with clinician. Clinician utilized Supportive therapy to  process thoughts, feelings, and events leading up to child's birth. Clinician identified strengths and knowledge based on experience. Clinician processed the birth story and met new baby. Clinician discussed plan for baby to stay in NICU until his due date, which is still 8 weeks away. Clinician processed Ansley's plan for her own care. Clinician explored updates on father and noted that she has a plan to inform him that the baby is here and to wait and see how or if he responds. Clinician reflected Bryttney's experience as a child being raised by her mother, and encouraged Danine to use her experience in guiding and supporting her son through the same experience.   Plan: Return again in 2 weeks.  Diagnosis: MDD (major depressive disorder), recurrent episode, mild (HCC)  Collaboration of Care: Other provider involved in patient's care AEB reviewed med notes from hospital  Patient/Guardian was advised Release of Information must be obtained prior to any record release in order to collaborate their care with an outside provider. Patient/Guardian was advised if they have not already done so to contact the registration department to sign all necessary forms in order for us  to release information regarding their care.   Consent: Patient/Guardian gives verbal consent for treatment and assignment of benefits for services provided during this visit. Patient/Guardian expressed understanding and agreed to proceed.   Harlene JONELLE [place], LCSW 06/29/2024

## 2024-06-29 NOTE — Care Plan (Signed)
  Problem: Coping Goal: Patient verbalizes fears and concerns related to their condition Description: 1. Provide emotional support to establish a trusting relationship 2. Allow the patient/family to voice fears 3. Identify culturally appropriate resources and support systems  Outcome: Progressing   Problem: Fluid Volume Goal: Prevent fluid retention Description: 1. Assess and document edema 2.  Monitor intake and output 3.  Monitor for signs and symptoms of fluid overload 4.  Assess lung sounds, respiratory rate, and effort 5.  Assess lab values such as increased sodium levels, decreased potassium, hematocrit and hemoglobin. Monitor renal values that show evidence of fluid retention: BUN, creatinine, urine specific gravity.  Outcome: Progressing   Problem: Health Behavior Goal: Patient verbalizes understanding of disease process and appropriate treatment plan Description: 1. Discuss signs/symptoms of increased hypertension severity 2. Educate patient/family on postpartum warning signs   Outcome: Progressing Goal: Patient Safety Description: 1. Assess for CNS involvement;  these symptoms include headache, irritability, visual disturbances, and alterations in level of consciousness 2. Assess deep tendon reflexes and clonus 3. Assess for presence of epigastric, right upper quadrant pain  Outcome: Progressing Goal: Establish measures to lessen the likelihood of seizures Description: 1. Keep room quiet and dimly lit 2. Limit visitors 3. Promote rest 4. Cluster care  Outcome: Progressing

## 2024-07-01 ENCOUNTER — Encounter: Payer: Self-pay | Admitting: Obstetrics & Gynecology

## 2024-07-01 ENCOUNTER — Other Ambulatory Visit

## 2024-07-02 ENCOUNTER — Other Ambulatory Visit: Payer: Self-pay | Admitting: Obstetrics & Gynecology

## 2024-07-02 DIAGNOSIS — F419 Anxiety disorder, unspecified: Secondary | ICD-10-CM

## 2024-07-02 MED ORDER — SERTRALINE HCL 25 MG PO TABS: 25.0000 mg | ORAL_TABLET | Freq: Every day | ORAL | 4 refills | Status: DC

## 2024-07-02 NOTE — Progress Notes (Signed)
 Rx zoloft   Loie Jahr, DO Attending Obstetrician & Gynecologist, [facility] for [employer], [facility]

## 2024-07-05 ENCOUNTER — Encounter: Admitting: Obstetrics & Gynecology

## 2024-07-05 ENCOUNTER — Other Ambulatory Visit

## 2024-07-08 ENCOUNTER — Other Ambulatory Visit

## 2024-07-12 ENCOUNTER — Encounter: Admitting: Obstetrics & Gynecology

## 2024-07-12 ENCOUNTER — Other Ambulatory Visit

## 2024-07-13 ENCOUNTER — Ambulatory Visit (HOSPITAL_COMMUNITY): Admitting: Licensed Clinical Social Worker

## 2024-07-14 ENCOUNTER — Ambulatory Visit (INDEPENDENT_AMBULATORY_CARE_PROVIDER_SITE_OTHER): Admitting: Licensed Clinical Social Worker

## 2024-07-14 ENCOUNTER — Encounter (HOSPITAL_COMMUNITY): Payer: Self-pay | Admitting: Licensed Clinical Social Worker

## 2024-07-14 DIAGNOSIS — F331 Major depressive disorder, recurrent, moderate: Secondary | ICD-10-CM

## 2024-07-14 NOTE — Progress Notes (Signed)
 Virtual Visit via Video Note  I connected with [name] on 07/14/24 at  8:00 AM EDT by a video enabled telemedicine application and verified that I am speaking with the correct person using two identifiers.  Location: Patient: home Provider: office   I discussed the limitations of evaluation and management by telemedicine and the availability of in person appointments. The patient expressed understanding and agreed to proceed.    I discussed the assessment and treatment plan with the patient. The patient was provided an opportunity to ask questions and all were answered. The patient agreed with the plan and demonstrated an understanding of the instructions.   The patient was advised to call back or seek an in-person evaluation if the symptoms worsen or if the condition fails to improve as anticipated.  I provided 55 minutes of non-face-to-face time during this encounter.   [name], LCSW   THERAPIST PROGRESS NOTE  Session Time: 8:00am -8:55am  Participation Level: Active  Behavioral Response: CasualAlertDepressed and Irritable  Type of Therapy: Individual Therapy  Treatment Goals addressed:      Goal: LTG: Reduce frequency, intensity, and duration of depression symptoms so that daily functioning is improved     Dates: Start:  10/21/23    Expected End:  04/19/24       Disciplines: Interdisciplinary, PROVIDER                    Goal: LTG: Increase coping skills to manage depression and improve ability to perform daily activities     Dates: Start:  10/21/23    Expected End:  04/19/24       Disciplines: Interdisciplinary, PROVIDER      ProgressTowards Goals: Progressing  Interventions: CBT  Summary: KAYLYNNE ANDRES is a 23 y.o. female who presents with MDD, recurrent, moderate.   Suicidal/Homicidal: Nowithout intent/plan  Therapist Response: Ieshia engaged well in individual virtual session with clinician. Clinician utilized CBT to process  thoughts, feelings, and interactions. Clinician processed concerns and feelings about her baby's father now stepping in and wanting to be a part of co-parenting. Clinician identified thoughts, feelings, and instincts about his wishes. Clinician reflected pros and cons. Clinician also processed the man's new girlfriend who is also pregnant with his children. Clinician discussed ways for Azari to build some level of trust with the girlfriend in order to enable the man to spend time with all of his children, and to allow the children to have relationships as siblings.   Clinician explored post-partum depression sxs, noting frustration, sadness, and irritability. Eithel shared the current triggers and frustrations. Clinician reflected the importance of using coping skills, communicating with others, and staying close to mom.   Plan: Return again in 2 weeks.  Diagnosis: MDD (major depressive disorder), recurrent episode, moderate (HCC)  Collaboration of Care: Patient refused AEB none required  Patient/Guardian was advised Release of Information must be obtained prior to any record release in order to collaborate their care with an outside provider. Patient/Guardian was advised if they have not already done so to contact the registration department to sign all necessary forms in order for us  to release information regarding their care.   Consent: Patient/Guardian gives verbal consent for treatment and assignment of benefits for services provided during this visit. Patient/Guardian expressed understanding and agreed to proceed.   Harlene JONELLE [place], LCSW 07/14/2024

## 2024-07-15 ENCOUNTER — Other Ambulatory Visit

## 2024-07-19 ENCOUNTER — Encounter: Admitting: Women's Health

## 2024-07-19 ENCOUNTER — Other Ambulatory Visit

## 2024-07-22 ENCOUNTER — Other Ambulatory Visit

## 2024-07-26 ENCOUNTER — Other Ambulatory Visit

## 2024-07-26 ENCOUNTER — Encounter: Admitting: Obstetrics & Gynecology

## 2024-07-27 ENCOUNTER — Ambulatory Visit (INDEPENDENT_AMBULATORY_CARE_PROVIDER_SITE_OTHER): Payer: Self-pay | Admitting: Licensed Clinical Social Worker

## 2024-07-27 ENCOUNTER — Encounter (HOSPITAL_COMMUNITY): Payer: Self-pay

## 2024-07-27 DIAGNOSIS — Z0389 Encounter for observation for other suspected diseases and conditions ruled out: Secondary | ICD-10-CM

## 2024-07-27 NOTE — Progress Notes (Deleted)
Client no showed for appointment.  

## 2024-07-29 ENCOUNTER — Ambulatory Visit: Admitting: Obstetrics & Gynecology

## 2024-07-29 ENCOUNTER — Other Ambulatory Visit

## 2024-07-29 ENCOUNTER — Encounter: Payer: Self-pay | Admitting: Obstetrics & Gynecology

## 2024-07-29 ENCOUNTER — Encounter (HOSPITAL_COMMUNITY): Payer: Self-pay | Admitting: Licensed Clinical Social Worker

## 2024-07-29 ENCOUNTER — Ambulatory Visit (INDEPENDENT_AMBULATORY_CARE_PROVIDER_SITE_OTHER): Admitting: Licensed Clinical Social Worker

## 2024-07-29 DIAGNOSIS — F330 Major depressive disorder, recurrent, mild: Secondary | ICD-10-CM | POA: Diagnosis not present

## 2024-07-29 MED ORDER — SERTRALINE HCL 100 MG PO TABS: 100.0000 mg | ORAL_TABLET | Freq: Every day | ORAL | 11 refills | Status: DC

## 2024-07-29 MED ORDER — VALACYCLOVIR HCL 1 G PO TABS: 1000.0000 mg | ORAL_TABLET | Freq: Two times a day (BID) | ORAL | 11 refills | Status: DC

## 2024-07-29 NOTE — Progress Notes (Signed)
 Virtual Visit via Video Note  I connected with Taylor Jefferson on 07/29/24 at 12:30 PM EDT by a video enabled telemedicine application and verified that I am speaking with the correct person using two identifiers.  Location: Patient: home Provider: home office   I discussed the limitations of evaluation and management by telemedicine and the availability of in person appointments. The patient expressed understanding and agreed to proceed.   I discussed the assessment and treatment plan with the patient. The patient was provided an opportunity to ask questions and all were answered. The patient agreed with the plan and demonstrated an understanding of the instructions.   The patient was advised to call back or seek an in-person evaluation if the symptoms worsen or if the condition fails to improve as anticipated.  I provided 55 minutes of non-face-to-face time during this encounter.   [name], LCSW   THERAPIST PROGRESS NOTE  Session Time: 12:30pm-1:25pm  Participation Level: Active  Behavioral Response: Well GroomedAlertAnxious and Euthymic  Type of Therapy: Individual Therapy  Treatment Goals addressed:  Goal: LTG: Reduce frequency, intensity, and duration of depression symptoms so that daily functioning is improved     Dates: Start:  10/21/23    Expected End:  04/19/24       Disciplines: Interdisciplinary, PROVIDER                    Goal: LTG: Increase coping skills to manage depression and improve ability to perform daily activities     Dates: Start:  10/21/23    Expected End:  04/19/24       Disciplines: Interdisciplinary, PROVIDER      ProgressTowards Goals: Progressing  Interventions: CBT  Summary: Taylor Jefferson is a 23 y.o. female who presents with MDD, recurrent, mild.   Suicidal/Homicidal: Nowithout intent/plan  Therapist Response: Taylor Jefferson engaged well in individual virtual session with clinician. Clinician utilized CBT to process thoughts,  feelings, and interactions. Clinician identified improved mood with increase in Zoloft  to 100mg . Clinician processed change in post-partum anxiety as well. Clinician explored updates with baby and noted that Taylor Jefferson has been staying at the [facility] in order to be with the baby as much as possible. Clinician discussed coping skills, maternal instincts, and interactions with hospital staff. Clinician identified increase in assertiveness with nurses and doctors for her son, as she reports she is the only one who can advocate for him.  Clinician processed relationship with the father of baby and noted that he has disappeared again. Clinician explored thoughts and feelings, noting that she is more focused on her son's growth and progress than anything with the father.  Clinician identified improvement in relationship with mother, who has been her most ardent advocate and support for [place] for her entire life.   Plan: Return again in 1 weeks.  Diagnosis: MDD (major depressive disorder), recurrent episode, mild  Collaboration of Care: Patient refused AEB none required. Meds are doing well.   Patient/Guardian was advised Release of Information must be obtained prior to any record release in order to collaborate their care with an outside provider. Patient/Guardian was advised if they have not already done so to contact the registration department to sign all necessary forms in order for us  to release information regarding their care.   Consent: Patient/Guardian gives verbal consent for treatment and assignment of benefits for services provided during this visit. Patient/Guardian expressed understanding and agreed to proceed.   Harlene JONELLE [place], LCSW 07/29/2024

## 2024-07-29 NOTE — Progress Notes (Signed)
 Subjective:     Taylor Jefferson is a 23 y.o. female who presents for a postpartum visit. She is 5 weeks postpartum following a low cervical transverse Cesarean section. I have fully reviewed the prenatal and intrapartum course. The delivery was at [redacted]w[redacted]d gestational weeks. Outcome: primary cesarean section, low transverse incision. Anesthesia: spinal. Postpartum course has been NICU doing well. Baby's course has been NICU doing well. Baby is feeding by bottlefeeding. Bleeding occasional. Bowel function is normal. Bladder function is normal. Patient is sexually active. Contraception method is Nexplanon . Postpartum depression screening: negative.  The following portions of the patient's history were reviewed and updated as appropriate: allergies, current medications, past family history, past medical history, past social history, past surgical history, and problem list.  Review of Systems Pertinent items are noted in HPI.   Objective:    BP 114/78 (BP Location: Right Arm, Patient Position: Sitting, Cuff Size: Normal)   Ht 5' 2 (1.575 m)   Wt 124 lb 8 oz (56.5 kg)   LMP 11/16/2023 (Exact Date)   Breastfeeding No   BMI 22.77 kg/m   General:  alert, cooperative, and no distress   Breasts:    Lungs:   Heart:    Abdomen: soft, non-tender; bowel sounds normal; no masses,  no organomegaly   Vulva:  normal  Vagina: normal vagina  Cervix:  normal  Corpus: normal size, contour, position, consistency, mobility, non-tender  Adnexa:  normal adnexa  Rectal Exam:         Assessment:    normal postpartum exam. Pap smear not done at today's visit.   Plan:    1. Contraception: Nexplanon  2.  3. Follow up in: prn  or as needed.

## 2024-08-02 ENCOUNTER — Encounter: Admitting: Women's Health

## 2024-08-02 ENCOUNTER — Other Ambulatory Visit

## 2024-08-05 ENCOUNTER — Other Ambulatory Visit

## 2024-08-05 ENCOUNTER — Ambulatory Visit (INDEPENDENT_AMBULATORY_CARE_PROVIDER_SITE_OTHER): Admitting: Licensed Clinical Social Worker

## 2024-08-05 DIAGNOSIS — F330 Major depressive disorder, recurrent, mild: Secondary | ICD-10-CM | POA: Diagnosis not present

## 2024-08-09 ENCOUNTER — Encounter: Admitting: Obstetrics & Gynecology

## 2024-08-09 ENCOUNTER — Other Ambulatory Visit

## 2024-08-12 ENCOUNTER — Ambulatory Visit (INDEPENDENT_AMBULATORY_CARE_PROVIDER_SITE_OTHER): Admitting: Licensed Clinical Social Worker

## 2024-08-12 ENCOUNTER — Other Ambulatory Visit

## 2024-08-12 DIAGNOSIS — F330 Major depressive disorder, recurrent, mild: Secondary | ICD-10-CM | POA: Diagnosis not present

## 2024-08-16 ENCOUNTER — Encounter: Admitting: Obstetrics & Gynecology

## 2024-08-16 ENCOUNTER — Other Ambulatory Visit

## 2024-08-17 ENCOUNTER — Encounter (HOSPITAL_COMMUNITY): Payer: Self-pay | Admitting: Licensed Clinical Social Worker

## 2024-08-17 NOTE — Progress Notes (Signed)
 Virtual Visit via Video Note  I connected with Taylor Jefferson on 08/12/24 at  3:30 PM EDT by a video enabled telemedicine application and verified that I am speaking with the correct person using two identifiers.  [facility] Provider[facility]   I discussed the limitations of evaluation and management by telemedicine and the availability of in person appointments. The patient expressed understanding and agreed to proceed.   I discussed the assessment and treatment plan with the patient. The patient was provided an opportunity to ask questions and all were answered. The patient agreed with the plan and demonstrated an understanding of the instructions.   The patient was advised to call back or seek an in-person evaluation if the symptoms worsen or if the condition fails to improve as anticipated.  I provided 45 minutes of non-face-to-face time during this encounter.   Harlene Taylor Rosser, LCSW   THERAPIST PROGRESS NOTE  Session Time: 3:30pm-4:15pm  Participation Level: Active  Behavioral Response: CasualAlertIrritable  Type of Therapy: Individual Therapy  Treatment Goals addressed:  Goal: LTG: Reduce frequency, intensity, and duration of depression symptoms so that daily functioning is improved     Dates: Start:  10/21/23    Expected End:  04/19/24       Disciplines: Interdisciplinary, PROVIDER                    Goal: LTG: Increase coping skills to manage depression and improve ability to perform daily activities     Dates: Start:  10/21/23    Expected End:  04/19/24       Disciplines: Interdisciplinary, PROVIDER      ProgressTowards Goals: Progressing  Interventions: Motivational Interviewing  Summary: Taylor Jefferson is a 23 y.o. female who presents with MDD, recurrent, mild.   Suicidal/Homicidal: Nowithout intent/plan  Therapist Response: Taylor Jefferson engaged well in individual virtual session with clinician. Clinician utilized MI  OARS to reflect and summarize thoughts, feelings, and interactions. Clinician explored updates with baby and noted that he may need a blood transfusion due to growth concerns. Clinician processed Taylor Jefferson's feelings about this and noted that she continues to do her research and to feel confident in his doctors. Taylor Jefferson shared some concerns about the nurses at times, due to lack of certain interventions that need to be done. Clinician provided time and space for Taylor Jefferson to process her frustration with nurses and identified that she has been in the room with her baby for the majority of her waking hours and only returns to the [employer] to shower and sleep. Clinician discussed supports and encouraged Taylor Jefferson to communicate with staff and other parents in the house. Taylor Jefferson shared that mom has been very supportive and helpful.   Plan: Return again in 1 weeks.  Diagnosis: MDD (major depressive disorder), recurrent episode, mild  Collaboration of Care: Patient refused AEB none required  Patient/Guardian was advised Release of Information must be obtained prior to any record release in order to collaborate their care with an outside provider. Patient/Guardian was advised if they have not already done so to contact the registration department to sign all necessary forms in order for us  to release information regarding their care.   Consent: Patient/Guardian gives verbal consent for treatment and assignment of benefits for services provided during this visit. Patient/Guardian expressed understanding and agreed to proceed.   Harlene Taylor [place], LCSW 08/17/2024

## 2024-08-19 ENCOUNTER — Other Ambulatory Visit

## 2024-08-19 ENCOUNTER — Ambulatory Visit (INDEPENDENT_AMBULATORY_CARE_PROVIDER_SITE_OTHER): Admitting: Licensed Clinical Social Worker

## 2024-08-19 ENCOUNTER — Encounter (HOSPITAL_COMMUNITY): Payer: Self-pay | Admitting: Licensed Clinical Social Worker

## 2024-08-19 DIAGNOSIS — F330 Major depressive disorder, recurrent, mild: Secondary | ICD-10-CM

## 2024-08-19 NOTE — Progress Notes (Signed)
 Virtual Visit via Video Note  I connected with Taylor Jefferson on 08/19/24 at  3:30 PM EDT by a video enabled telemedicine application and verified that I am speaking with the correct person using two identifiers.  Location: Patient: Taylor Jefferson house in [facility] Provider: home office   I discussed the limitations of evaluation and management by telemedicine and the availability of in person appointments. The patient expressed understanding and agreed to proceed.   I discussed the assessment and treatment plan with the patient. The patient was provided an opportunity to ask questions and all were answered. The patient agreed with the plan and demonstrated an understanding of the instructions.   The patient was advised to call back or seek an in-person evaluation if the symptoms worsen or if the condition fails to improve as anticipated.  I provided 55 minutes of non-face-to-face time during this encounter.   Taylor JONELLE Rosser, LCSW   THERAPIST PROGRESS NOTE  Session Time: 3:30pm-4:25pm  Participation Level: Active  Behavioral Response: NeatAlertIrritable  Type of Therapy: Individual Therapy  Treatment Goals addressed:  oal: LTG: Reduce frequency, intensity, and duration of depression symptoms so that daily functioning is improved     Dates: Start:  10/21/23    Expected End:  04/19/24       Disciplines: Interdisciplinary, PROVIDER                    Goal: LTG: Increase coping skills to manage depression and improve ability to perform daily activities     Dates: Start:  10/21/23    Expected End:  04/19/24       Disciplines: Interdisciplinary, PROVIDER       ProgressTowards Goals: Progressing  Interventions: CBT  Summary: Taylor Jefferson is a 23 y.o. female who presents with MDD, recurrent, mild.   Suicidal/Homicidal: Nowithout intent/plan  Therapist Response: Taylor Jefferson engaged well in individual virtual session with clinician. Clinician utilized CBT to  process thoughts, feelings, and behaviors. Clinician explored current concerns about the nurses caring for son in NICU. Clinician identified ways to communicate with nurses who do not know her. Clinician also explored other ways for Taylor Jefferson to ensure son's safety and good care. Clinician identified this as part of parenting, even though nurses know what to do, Taylor Jefferson the responsibility to make sure they know what to do. Clinician processed Taylor Jefferson's sense of anger when something isn't done with son or if something is done wrong. Clinician identified the importance of being calm and communicating calmly with nurses. Clinician reflected the value of Taylor Jefferson commitment to her son, as well as the joy that is visible on her face. Clinician explored natural supports, mom, cousin, boyfriend, friend. Clinician also identified the benefit of spending time with the other parents who have NICU babies and who are staying at [employer] house with her. Taylor Jefferson that she wants to keep in touch with a few parents she Jefferson met.    Plan: Return again in 1 weeks.  Diagnosis: MDD (major depressive disorder), recurrent episode, mild  Collaboration of Care: Patient refused AEB none required  Patient/Guardian was advised Release of Information must be obtained prior to any record release in order to collaborate their care with an outside provider. Patient/Guardian was advised if they have not already done so to contact the registration department to sign all necessary forms in order for us  to release information regarding their care.   Consent: Patient/Guardian gives verbal consent for treatment and assignment of benefits  for services provided during this visit. Patient/Guardian expressed understanding and agreed to proceed.   Taylor SAUNDERS [place], LCSW 08/19/2024

## 2024-08-20 ENCOUNTER — Other Ambulatory Visit: Payer: Self-pay | Admitting: Obstetrics & Gynecology

## 2024-08-20 ENCOUNTER — Encounter (HOSPITAL_COMMUNITY): Payer: Self-pay | Admitting: Licensed Clinical Social Worker

## 2024-08-20 NOTE — Progress Notes (Signed)
 Virtual Visit via Video Note  I connected with Taylor Jefferson on 08/05/24 at  3:30 PM EDT by a video enabled telemedicine application and verified that I am speaking with the correct person using two identifiers.  Location: Patient: hospital with baby Provider: home office   I discussed the limitations of evaluation and management by telemedicine and the availability of in person appointments. The patient expressed understanding and agreed to proceed.   I discussed the assessment and treatment plan with the patient. The patient was provided an opportunity to ask questions and all were answered. The patient agreed with the plan and demonstrated an understanding of the instructions.   The patient was advised to call back or seek an in-person evaluation if the symptoms worsen or if the condition fails to improve as anticipated.  I provided 45 minutes of non-face-to-face time during this encounter.   [name], LCSW   THERAPIST PROGRESS NOTE  Session Time: 3:30pm-4:15pm  Participation Level: Active  Behavioral Response: CasualAlertIrritable  Type of Therapy: Individual Therapy  Treatment Goals addressed:  Goal: LTG: Reduce frequency, intensity, and duration of depression symptoms so that daily functioning is improved     Dates: Start:  10/21/23    Expected End:  04/19/24       Disciplines: Interdisciplinary, PROVIDER                    Goal: LTG: Increase coping skills to manage depression and improve ability to perform daily activities     Dates: Start:  10/21/23    Expected End:  04/19/24       Disciplines: Interdisciplinary, PROVIDER      ProgressTowards Goals: Progressing  Interventions: CBT  Summary: Taylor Jefferson is a 23 y.o. female who presents with MDD, recurrent, mild.   Suicidal/Homicidal: Nowithout intent/plan  Therapist Response: Jake engaged well in individual virtual session with clinician. Clinician utilized CBT to process thoughts,  feelings, and behaviors. Clinician explored current triggers which are increasing anxiety and frustration. Clinician processed ways to cope with these feelings as well as be a strong advocate for her son. Clinician identified the importance of clear communication, rather than ignoring the problem and staying mad. Clinician role played ways to confront the nurses who are not completing all the tasks required during their care time with son.   Plan: Return again in 1-2 weeks.  Diagnosis: MDD (major depressive disorder), recurrent episode, mild  Collaboration of Care: Patient refused AEB none required  Patient/Guardian was advised Release of Information must be obtained prior to any record release in order to collaborate their care with an outside provider. Patient/Guardian was advised if they have not already done so to contact the registration department to sign all necessary forms in order for us  to release information regarding their care.   Consent: Patient/Guardian gives verbal consent for treatment and assignment of benefits for services provided during this visit. Patient/Guardian expressed understanding and agreed to proceed.   Harlene JONELLE [place], LCSW 08/20/2024

## 2024-08-26 ENCOUNTER — Ambulatory Visit (HOSPITAL_COMMUNITY): Admitting: Licensed Clinical Social Worker

## 2024-08-26 ENCOUNTER — Encounter (HOSPITAL_COMMUNITY): Payer: Self-pay

## 2024-09-02 ENCOUNTER — Ambulatory Visit (HOSPITAL_COMMUNITY): Admitting: Licensed Clinical Social Worker

## 2024-09-02 DIAGNOSIS — F330 Major depressive disorder, recurrent, mild: Secondary | ICD-10-CM

## 2024-09-09 ENCOUNTER — Ambulatory Visit (HOSPITAL_COMMUNITY): Admitting: Licensed Clinical Social Worker

## 2024-09-09 ENCOUNTER — Encounter (HOSPITAL_COMMUNITY): Payer: Self-pay

## 2024-09-15 ENCOUNTER — Encounter (HOSPITAL_COMMUNITY): Payer: Self-pay | Admitting: Licensed Clinical Social Worker

## 2024-09-15 ENCOUNTER — Ambulatory Visit (INDEPENDENT_AMBULATORY_CARE_PROVIDER_SITE_OTHER): Admitting: Licensed Clinical Social Worker

## 2024-09-15 DIAGNOSIS — F330 Major depressive disorder, recurrent, mild: Secondary | ICD-10-CM | POA: Diagnosis not present

## 2024-09-15 NOTE — Progress Notes (Signed)
 Virtual Visit via Video Note  I connected with Taylor Jefferson on 09/02/24 at  3:30 PM EST by a video enabled telemedicine application and verified that I am speaking with the correct person using two identifiers.  Location: Patient: home Provider: home office   I discussed the limitations of evaluation and management by telemedicine and the availability of in person appointments. The patient expressed understanding and agreed to proceed.    I discussed the assessment and treatment plan with the patient. The patient was provided an opportunity to ask questions and all were answered. The patient agreed with the plan and demonstrated an understanding of the instructions.   The patient was advised to call back or seek an in-person evaluation if the symptoms worsen or if the condition fails to improve as anticipated.  I provided 45 minutes of non-face-to-face time during this encounter.   [name], LCSW   THERAPIST PROGRESS NOTE  Session Time: 3:30pm-4:15pm  Participation Level: Active  Behavioral Response: CasualAlertEuthymic  Type of Therapy: Individual Therapy  Treatment Goals addressed:  Goal: LTG: Reduce frequency, intensity, and duration of depression symptoms so that daily functioning is improved     Dates: Start:  10/21/23    Expected End:  04/19/24       Disciplines: Interdisciplinary, PROVIDER                    Goal: LTG: Increase coping skills to manage depression and improve ability to perform daily activities     Dates: Start:  10/21/23    Expected End:  04/19/24       Disciplines: Interdisciplinary, PROVIDER      ProgressTowards Goals: Progressing  Interventions: Motivational Interviewing  Summary: Taylor Jefferson is a 23 y.o. female who presents with MDD, recurrent, mild.   Suicidal/Homicidal: Nowithout intent/plan  Therapist Response: Alylah engaged well in individual virtual session with clinician. Clinician utilized MI OARS to reflect  and summarize thoughts, feelings, and behaviors. Clinician explored interactions at home and in the hospital. Clinician identified plans and hopes that baby will be able to come home soon. Clinician explored preparedness for this transition and noted the excitement and nervousness. Clinician provided feedback about all the feelings and noted the normalcy of these feelings in this new phase of life. Clinician processed the strength that Taylor Jefferson has built up in advocating, supporting, and bonding with her baby. Clinician also noted the fierce attention to details when it came to what was done by nurses in the hospital. Clinician reflected the joy of motherhood in Taylor Jefferson's life.   Plan: Return again in 2 weeks.  Diagnosis: MDD (major depressive disorder), recurrent episode, mild  Collaboration of Care: Patient refused AEB none required  Patient/Guardian was advised Release of Information must be obtained prior to any record release in order to collaborate their care with an outside provider. Patient/Guardian was advised if they have not already done so to contact the registration department to sign all necessary forms in order for us  to release information regarding their care.   Consent: Patient/Guardian gives verbal consent for treatment and assignment of benefits for services provided during this visit. Patient/Guardian expressed understanding and agreed to proceed.   Harlene JONELLE [place], LCSW 09/15/2024

## 2024-09-15 NOTE — Progress Notes (Signed)
 Virtual Visit via Video Note  I connected with Taylor Jefferson on 09/15/24 at  3:30 PM EST by a video enabled telemedicine application and verified that I am speaking with the correct person using two identifiers.  Location: Patient: baby's hospital room Provider: home office   I discussed the limitations of evaluation and management by telemedicine and the availability of in person appointments. The patient expressed understanding and agreed to proceed.    I discussed the assessment and treatment plan with the patient. The patient was provided an opportunity to ask questions and all were answered. The patient agreed with the plan and demonstrated an understanding of the instructions.   The patient was advised to call back or seek an in-person evaluation if the symptoms worsen or if the condition fails to improve as anticipated.  I provided 30 minutes of non-face-to-face time during this encounter.   Taylor JONELLE Rosser, LCSW   THERAPIST PROGRESS NOTE  Session Time: 3:30pm-4:00pm  Participation Level: Active  Behavioral Response: CasualAlertEuthymic  Type of Therapy: Individual Therapy  Treatment Goals addressed:  Goal: LTG: Reduce frequency, intensity, and duration of depression symptoms so that daily functioning is improved     Dates: Start:  10/21/23    Expected End:  04/19/24       Disciplines: Interdisciplinary, PROVIDER                    Goal: LTG: Increase coping skills to manage depression and improve ability to perform daily activities     Dates: Start:  10/21/23    Expected End:  04/19/24       Disciplines: Interdisciplinary, PROVIDER      ProgressTowards Goals: Progressing  Interventions: Supportive  Summary: Taylor Jefferson is a 23 y.o. female who presents with MDD, recurrent, mild.   Suicidal/Homicidal: Nowithout intent/plan  Therapist Response: Taylor Jefferson engaged well in individual virtual session from baby's room at [facility].  Clinician utilized supportive therapy to check in on baby's progress, weight, and his health. Taylor Jefferson shared that they plan on taking him home this weekend. Clinician processed thoughts and feelings, including a little bit of anxiousness, a lot of excitement, and some mixed feelings about life starting up again.  Taylor Jefferson shared that her maternal grandmother died yesterday. Clinician explored thoughts and feelings, noting that the feelings will likely be complicated due to the nature of their relationship. Clinician discussed Taylor Jefferson's feelings about the death and also noted that the excitement about bringing baby home has made the death feel secondary. Clinician validated this feeling and identified no reason for guilt or shame.   Plan: Return again in 2 weeks.  Diagnosis: MDD (major depressive disorder), recurrent episode, mild  Collaboration of Care: Patient refused AEB none required  Patient/Guardian was advised Release of Information must be obtained prior to any record release in order to collaborate their care with an outside provider. Patient/Guardian was advised if they have not already done so to contact the registration department to sign all necessary forms in order for us  to release information regarding their care.   Consent: Patient/Guardian gives verbal consent for treatment and assignment of benefits for services provided during this visit. Patient/Guardian expressed understanding and agreed to proceed.   Taylor JONELLE [place], LCSW 09/15/2024

## 2024-09-30 ENCOUNTER — Ambulatory Visit (HOSPITAL_COMMUNITY): Admitting: Licensed Clinical Social Worker

## 2024-09-30 DIAGNOSIS — F330 Major depressive disorder, recurrent, mild: Secondary | ICD-10-CM

## 2024-10-06 ENCOUNTER — Encounter (HOSPITAL_COMMUNITY): Payer: Self-pay

## 2024-10-06 ENCOUNTER — Ambulatory Visit (HOSPITAL_COMMUNITY): Admitting: Licensed Clinical Social Worker

## 2024-10-15 ENCOUNTER — Encounter (HOSPITAL_COMMUNITY): Payer: Self-pay | Admitting: Licensed Clinical Social Worker

## 2024-10-15 NOTE — Progress Notes (Signed)
 Virtual Visit via Video Note  I connected with Taylor Jefferson on 09/30/24 at  4:30 PM EST by a video enabled telemedicine application and verified that I am speaking with the correct person using two identifiers.  Location: Patient: home Provider: home office   I discussed the limitations of evaluation and management by telemedicine and the availability of in person appointments. The patient expressed understanding and agreed to proceed.  I discussed the assessment and treatment plan with the patient. The patient was provided an opportunity to ask questions and all were answered. The patient agreed with the plan and demonstrated an understanding of the instructions.   The patient was advised to call back or seek an in-person evaluation if the symptoms worsen or if the condition fails to improve as anticipated.  I provided 45 minutes of non-face-to-face time during this encounter.   Taylor JONELLE Rosser, LCSW   THERAPIST PROGRESS NOTE  Session Time: 4:30pm-5:15pm  Participation Level: Active  Behavioral Response: CasualAlertEuthymic  Type of Therapy: Individual Therapy  Treatment Goals addressed:  Goal: LTG: Reduce frequency, intensity, and duration of depression symptoms so that daily functioning is improved     Dates: Start:  10/21/23    Expected End:  04/19/24       Disciplines: Interdisciplinary, PROVIDER                    Goal: LTG: Increase coping skills to manage depression and improve ability to perform daily activities     Dates: Start:  10/21/23    Expected End:  04/19/24       Disciplines: Interdisciplinary, PROVIDER      ProgressTowards Goals: Progressing  Interventions: Motivational Interviewing  Summary: Taylor Jefferson is a 23 y.o. female who presents with MDD, recurrent, mild.   Suicidal/Homicidal: Nowithout intent/plan  Therapist Response: Taylor Jefferson engaged well in individual virtual session with clinician. Clinician utilized MI OARS to reflect and  summarize thoughts, feelings, and behaviors. Clinician identified that baby is now home and adjustment to full-time parenting is starting. Clinician provided time and space for Taylor Jefferson to share her recent experiences getting baby home and ensuring that he is comfortable. Clinician also processed the recent loss of MGM, who died within the past week or so. Clinician provided psychoeducation about grief and also noted that the current joy of the baby has stolen focus from the grief. Taylor Jefferson shared this has been very helpful, especially when she and mother want to cry, baby has been the coping skill. Taylor Jefferson processed her experience over the past several months of baby being in the hospital, her relationships with the other parents, and the challenges of doing this on her own.   Plan: Return again in 2 weeks.  Diagnosis: MDD (major depressive disorder), recurrent episode, mild  Collaboration of Care: Patient refused AEB none required  Patient/Guardian was advised Release of Information must be obtained prior to any record release in order to collaborate their care with an outside provider. Patient/Guardian was advised if they have not already done so to contact the registration department to sign all necessary forms in order for us  to release information regarding their care.   Consent: Patient/Guardian gives verbal consent for treatment and assignment of benefits for services provided during this visit. Patient/Guardian expressed understanding and agreed to proceed.   Taylor JONELLE [place], LCSW 10/15/2024

## 2024-10-19 ENCOUNTER — Encounter (HOSPITAL_COMMUNITY): Payer: Self-pay | Admitting: Licensed Clinical Social Worker

## 2024-10-19 ENCOUNTER — Ambulatory Visit (INDEPENDENT_AMBULATORY_CARE_PROVIDER_SITE_OTHER): Admitting: Licensed Clinical Social Worker

## 2024-10-19 DIAGNOSIS — F4321 Adjustment disorder with depressed mood: Secondary | ICD-10-CM

## 2024-10-19 NOTE — Progress Notes (Signed)
" ° °  THERAPIST PROGRESS NOTE  Session Time: 3:30pm-4:25pm  Participation Level: Active  Behavioral Response: Well GroomedAlertEuthymic  Type of Therapy: Individual Therapy  Treatment Goals addressed:  Active     OP Depression     LTG: Reduce frequency, intensity, and duration of depression symptoms so that daily functioning is improved (Progressing)     Start:  10/19/24         LTG: Increase coping skills to manage depression and improve ability to perform daily activities (Progressing)     Start:  10/19/24             ProgressTowards Goals: Progressing  Interventions: Motivational Interviewing  Summary: Taylor Jefferson is a 23 y.o. female who presents with Adjustment disorder with depressed mood.   Suicidal/Homicidal: Nowithout intent/plan  Therapist Response: Amilya engaged well in individual in person session. She presented well with new baby in tow. Clinician utilized MI OARS to reflect and summarize thoughts, feelings, and behaviors. Clinician reflected the significant change in reality with baby at home, following almost 3 months in the NICU. Clinician reflected the difficulty of doing this on her own, even though her mother is very helpful. Clinician explored self care and ability to regulate her mood. [name] shared that she stopped taking SSRI prescribed in the hospital due to wanting to try to do this on her own. Clinician encouraged [name] to communicate with her doctor about this decision. Clinician also discussed the importance of using Body Scanning to address any concerns or areas of discomfort or stress before holding the baby. Clinician also discussed the importance of taking breaks when she needs to, especially when the baby has unexplained crying or fussiness, so as not to take out her frustration or stress on the baby.  Clinician reflected [name]'s extreme level of advocacy for her baby, in medical and home settings.   Plan: Return again in 2  weeks.  Diagnosis: Adjustment disorder with depressed mood  Collaboration of Care: Other provider involved in patient's care AEB encouraged [name] to communicate with her doctor about going off SSRI.   Patient/Guardian was advised Release of Information must be obtained prior to any record release in order to collaborate their care with an outside provider. Patient/Guardian was advised if they have not already done so to contact the registration department to sign all necessary forms in order for us  to release information regarding their care.   Consent: Patient/Guardian gives verbal consent for treatment and assignment of benefits for services provided during this visit. Patient/Guardian expressed understanding and agreed to proceed.   [name] [place], LCSW 10/19/2024  "

## 2024-10-27 ENCOUNTER — Encounter: Payer: Self-pay | Admitting: Women's Health

## 2024-10-27 ENCOUNTER — Ambulatory Visit: Admitting: Women's Health

## 2024-10-27 VITALS — BP 106/71 | HR 86 | Ht 63.0 in | Wt 129.0 lb

## 2024-10-27 DIAGNOSIS — L7682 Other postprocedural complications of skin and subcutaneous tissue: Secondary | ICD-10-CM

## 2024-10-27 DIAGNOSIS — Z48816 Encounter for surgical aftercare following surgery on the genitourinary system: Secondary | ICD-10-CM

## 2024-10-27 MED ORDER — GABAPENTIN 300 MG PO CAPS: 300.0000 mg | ORAL_CAPSULE | Freq: Three times a day (TID) | ORAL | 0 refills | Status: AC
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
## 2024-11-02 ENCOUNTER — Emergency Department (HOSPITAL_COMMUNITY)

## 2024-11-02 ENCOUNTER — Other Ambulatory Visit: Payer: Self-pay

## 2024-11-02 ENCOUNTER — Encounter (HOSPITAL_COMMUNITY): Payer: Self-pay

## 2024-11-02 ENCOUNTER — Emergency Department (HOSPITAL_COMMUNITY)
Admission: EM | Admit: 2024-11-02 | Discharge: 2024-11-02 | Disposition: A | Discharge status: Home / Self Care | Attending: Emergency Medicine | Admitting: Emergency Medicine

## 2024-11-02 DIAGNOSIS — Z79899 Other long term (current) drug therapy: Secondary | ICD-10-CM | POA: Diagnosis not present

## 2024-11-02 DIAGNOSIS — R1030 Lower abdominal pain, unspecified: Secondary | ICD-10-CM | POA: Diagnosis present

## 2024-11-02 DIAGNOSIS — N3000 Acute cystitis without hematuria: Secondary | ICD-10-CM | POA: Insufficient documentation

## 2024-11-02 LAB — COMPREHENSIVE METABOLIC PANEL WITH GFR
ALT: 15 U/L (ref 0–44)
AST: 19 U/L (ref 15–41)
Albumin: 4.4 g/dL (ref 3.5–5.0)
Alkaline Phosphatase: 80 U/L (ref 38–126)
Anion gap: 14 (ref 5–15)
BUN: 14 mg/dL (ref 6–20)
CO2: 20 mmol/L — ABNORMAL LOW (ref 22–32)
Calcium: 9.4 mg/dL (ref 8.9–10.3)
Chloride: 104 mmol/L (ref 98–111)
Creatinine, Ser: 0.75 mg/dL (ref 0.44–1.00)
GFR, Estimated: >60 mL/min
Glucose, Bld: 89 mg/dL (ref 70–99)
Potassium: 3.7 mmol/L (ref 3.5–5.1)
Sodium: 139 mmol/L (ref 135–145)
Total Bilirubin: 0.4 mg/dL (ref 0.0–1.2)
Total Protein: 7.8 g/dL (ref 6.5–8.1)

## 2024-11-02 LAB — URINALYSIS, W/ REFLEX TO CULTURE (INFECTION SUSPECTED)
Bilirubin Urine: NEGATIVE
Glucose, UA: NEGATIVE mg/dL
Ketones, ur: NEGATIVE mg/dL
Nitrite: POSITIVE — AB
Protein, ur: NEGATIVE mg/dL
Specific Gravity, Urine: 1.02 (ref 1.005–1.030)
WBC, UA: >50 WBC/hpf (ref 0–5)
pH: 6 (ref 5.0–8.0)

## 2024-11-02 LAB — CBC WITH DIFFERENTIAL/PLATELET
Abs Immature Granulocytes: 0.01 K/uL (ref 0.00–0.07)
Basophils Absolute: 0 K/uL (ref 0.0–0.1)
Basophils Relative: 0 %
Eosinophils Absolute: 0.1 K/uL (ref 0.0–0.5)
Eosinophils Relative: 2 %
HCT: 40.5 % (ref 36.0–46.0)
Hemoglobin: 13.3 g/dL (ref 12.0–15.0)
Immature Granulocytes: 0 %
Lymphocytes Relative: 38 %
Lymphs Abs: 1.8 K/uL (ref 0.7–4.0)
MCH: 29.4 pg (ref 26.0–34.0)
MCHC: 32.8 g/dL (ref 30.0–36.0)
MCV: 89.4 fL (ref 80.0–100.0)
Monocytes Absolute: 0.4 K/uL (ref 0.1–1.0)
Monocytes Relative: 9 %
Neutro Abs: 2.4 K/uL (ref 1.7–7.7)
Neutrophils Relative %: 51 %
Platelets: 204 K/uL (ref 150–400)
RBC: 4.53 MIL/uL (ref 3.87–5.11)
RDW: 12.1 % (ref 11.5–15.5)
WBC: 4.7 K/uL (ref 4.0–10.5)
nRBC: 0 % (ref 0.0–0.2)

## 2024-11-02 LAB — LIPASE, BLOOD: Lipase: 54 U/L — ABNORMAL HIGH (ref 11–51)

## 2024-11-02 LAB — PREGNANCY, URINE: Preg Test, Ur: NEGATIVE

## 2024-11-02 MED ORDER — CEPHALEXIN 500 MG PO CAPS: 500.0000 mg | ORAL_CAPSULE | Freq: Four times a day (QID) | ORAL | 0 refills | Status: AC

## 2024-11-02 MED ORDER — FENTANYL CITRATE (PF) 100 MCG/2ML IJ SOLN
50.0000 ug | Freq: Once | INTRAMUSCULAR | Status: AC
  Administered 2024-11-02: 50 ug via INTRAVENOUS
  Filled 2024-11-02: qty 2

## 2024-11-02 MED ORDER — ACETAMINOPHEN 500 MG PO TABS: 500.0000 mg | ORAL_TABLET | Freq: Four times a day (QID) | ORAL | Status: AC | PRN

## 2024-11-02 MED ORDER — PHENAZOPYRIDINE HCL 100 MG PO TABS
100.0000 mg | ORAL_TABLET | Freq: Once | ORAL | Status: AC
  Administered 2024-11-02: 100 mg via ORAL
  Filled 2024-11-02: qty 1

## 2024-11-02 MED ORDER — PHENAZOPYRIDINE HCL 95 MG PO TABS: 95.0000 mg | ORAL_TABLET | Freq: Three times a day (TID) | ORAL | 0 refills | Status: AC | PRN

## 2024-11-02 MED ORDER — SODIUM CHLORIDE 0.9 % IV SOLN
2.0000 g | Freq: Once | INTRAVENOUS | Status: AC
  Administered 2024-11-02: 2 g via INTRAVENOUS
  Filled 2024-11-02: qty 20

## 2024-11-02 MED ORDER — IOHEXOL 300 MG/ML  SOLN
100.0000 mL | Freq: Once | INTRAMUSCULAR | Status: AC | PRN
  Administered 2024-11-02: 100 mL via INTRAVENOUS
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
## 2024-11-03 ENCOUNTER — Ambulatory Visit (HOSPITAL_COMMUNITY): Admitting: Licensed Clinical Social Worker

## 2024-11-03 DIAGNOSIS — F4321 Adjustment disorder with depressed mood: Secondary | ICD-10-CM | POA: Diagnosis not present

## 2024-11-04 ENCOUNTER — Ambulatory Visit: Admitting: Obstetrics & Gynecology

## 2024-11-04 ENCOUNTER — Encounter (HOSPITAL_COMMUNITY): Payer: Self-pay | Admitting: Licensed Clinical Social Worker

## 2024-11-04 ENCOUNTER — Encounter (HOSPITAL_COMMUNITY): Payer: Self-pay

## 2024-11-04 VITALS — BP 127/83 | HR 85 | Ht 62.0 in | Wt 127.0 lb

## 2024-11-04 DIAGNOSIS — L7682 Other postprocedural complications of skin and subcutaneous tissue: Secondary | ICD-10-CM
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
## 2024-11-04 NOTE — Progress Notes (Signed)
 Virtual Visit via Video Note  I connected with Taylor Jefferson on 11/03/24 at  4:30 PM EST by a video enabled telemedicine application and verified that I am speaking with the correct person using two identifiers.  Location: Patient: home Provider: home office   I discussed the limitations of evaluation and management by telemedicine and the availability of in person appointments. The patient expressed understanding and agreed to proceed.   I discussed the assessment and treatment plan with the patient. The patient was provided an opportunity to ask questions and all were answered. The patient agreed with the plan and demonstrated an understanding of the instructions.   The patient was advised to call back or seek an in-person evaluation if the symptoms worsen or if the condition fails to improve as anticipated.  I provided 55 minutes of non-face-to-face time during this encounter.   Taylor JONELLE Rosser, LCSW   THERAPIST PROGRESS NOTE  Session Time: 4:30pm-5:25pm  Participation Level: Active  Behavioral Response: CasualAlertDepressed, Irritable, and guilty  Type of Therapy: Individual Therapy  Treatment Goals addressed:  Active     OP Depression     LTG: Reduce frequency, intensity, and duration of depression symptoms so that daily functioning is improved (Progressing)     Start:  10/19/24    Expected End:  11/02/25         LTG: Increase coping skills to manage depression and improve ability to perform daily activities (Progressing)     Start:  10/19/24            ProgressTowards Goals: Progressing  Interventions: Motivational Interviewing  Summary: Taylor Jefferson is a 24 y.o. female who presents with Adjustment Disorder with depressed mood.   Suicidal/Homicidal: Nowithout intent/plan  Therapist Response: Taylor Jefferson engaged well in individual virtual session with clinician. Clinician utilized MI OARS to reflect and summarize thoughts, feelings, and interactions.  Clinician explored updates with parenting, coping with baby's health needs, and concerns about finances. Clinician reflected the high level of guilt Taylor Jefferson has been feeling due to baby's health issues and the fact that the father has not expressed any interest in spending time with the baby. Clinician discussed updates with child support and provided positive feedback that there is time and there is a way for child support to locate the father. Clinician identified the amazing and positive work that Taylor Jefferson has put into parenting so far. Clinician also identified that because she has been through a similar situation, growing up with her mother and grandmother, she has a better understanding of how to help her son in the future when he asks questions about his father. Clinician encouraged Taylor Jefferson to write her son a letter now to explain what she is going through with his father and that she is fighting hard to get him the support he needs.   Plan: Return again in 1-2 weeks.  Diagnosis: Adjustment disorder with depressed mood  Collaboration of Care: Patient refused AEB none required  Patient/Guardian was advised Release of Information must be obtained prior to any record release in order to collaborate their care with an outside provider. Patient/Guardian was advised if they have not already done so to contact the registration department to sign all necessary forms in order for us  to release information regarding their care.   Consent: Patient/Guardian gives verbal consent for treatment and assignment of benefits for services provided during this visit. Patient/Guardian expressed understanding and agreed to proceed.   Taylor JONELLE Adrian, LCSW 11/04/2024

## 2024-11-17 ENCOUNTER — Ambulatory Visit (INDEPENDENT_AMBULATORY_CARE_PROVIDER_SITE_OTHER): Admitting: Licensed Clinical Social Worker

## 2024-11-17 DIAGNOSIS — F4321 Adjustment disorder with depressed mood: Secondary | ICD-10-CM | POA: Diagnosis not present

## 2024-11-20 ENCOUNTER — Encounter (HOSPITAL_COMMUNITY): Payer: Self-pay | Admitting: Licensed Clinical Social Worker

## 2024-11-20 NOTE — Progress Notes (Signed)
 Virtual Visit via Video Note  I connected with KHADEEJAH CASTNER on 11/17/24 at  4:30 PM EST by a video enabled telemedicine application and verified that I am speaking with the correct person using two identifiers.  Location: Patient: home Provider: home office   I discussed the limitations of evaluation and management by telemedicine and the availability of in person appointments. The patient expressed understanding and agreed to proceed.    I discussed the assessment and treatment plan with the patient. The patient was provided an opportunity to ask questions and all were answered. The patient agreed with the plan and demonstrated an understanding of the instructions.   The patient was advised to call back or seek an in-person evaluation if the symptoms worsen or if the condition fails to improve as anticipated.  I provided 45 minutes of non-face-to-face time during this encounter.   [name], LCSW   THERAPIST PROGRESS NOTE  Session Time: 4:30pm-5:15pm  Participation Level: Active  Behavioral Response: CasualAlertEuthymic  Type of Therapy: Individual Therapy  Treatment Goals addressed:  Active     OP Depression     LTG: Reduce frequency, intensity, and duration of depression symptoms so that daily functioning is improved (Progressing)     Start:  10/19/24    Expected End:  11/02/25         LTG: Increase coping skills to manage depression and improve ability to perform daily activities (Progressing)     Start:  10/19/24    Expected End:  11/16/25            ProgressTowards Goals: Progressing  Interventions: CBT  Summary: KAYRON KALMAR is a 24 y.o. female who presents with Adjustment Disorder with depressed mood.   Suicidal/Homicidal: Nowithout intent/plan  Therapist Response: Jenissa engaged well in individual virtual session with clinician. Clinician utilized CBT to process thoughts, feelings, and interactions. Clinician explored updates from  last session noting improvement in mood and change in attitude about the father of her baby. Clinician processed ways that Chikita has looked at her own options and made changes for herself. Aliscia shared plans to return to college online in order to complete her Bachelor's Degree. Clinician reflected the excitement and the joy in her voice to be working on making herself better. Clinician identified the importance of doing this now so she can support herself and her baby. Neema shared that relationship with mom is great and things have been going well with her parenting so far.   Plan: Return again in 2 weeks.  Diagnosis: Adjustment disorder with depressed mood  Collaboration of Care: Patient refused AEB none required  Patient/Guardian was advised Release of Information must be obtained prior to any record release in order to collaborate their care with an outside provider. Patient/Guardian was advised if they have not already done so to contact the registration department to sign all necessary forms in order for us  to release information regarding their care.   Consent: Patient/Guardian gives verbal consent for treatment and assignment of benefits for services provided during this visit. Patient/Guardian expressed understanding and agreed to proceed.   Harlene JONELLE Rowlett, LCSW 11/20/2024

## 2024-11-30 ENCOUNTER — Ambulatory Visit (HOSPITAL_COMMUNITY): Admitting: Licensed Clinical Social Worker

## 2024-12-15 ENCOUNTER — Ambulatory Visit (HOSPITAL_COMMUNITY): Admitting: Licensed Clinical Social Worker
